# Patient Record
Sex: Male | Born: 1960
Health system: Southern US, Community
[De-identification: ages and names within clinical notes are randomized; demographics above are authoritative.]

## PROBLEM LIST (undated history)

## (undated) DIAGNOSIS — M5136 Other intervertebral disc degeneration, lumbar region: Secondary | ICD-10-CM

## (undated) DIAGNOSIS — I739 Peripheral vascular disease, unspecified: Secondary | ICD-10-CM

## (undated) DIAGNOSIS — K219 Gastro-esophageal reflux disease without esophagitis: Secondary | ICD-10-CM

## (undated) DIAGNOSIS — T8859XA Other complications of anesthesia, initial encounter: Secondary | ICD-10-CM

## (undated) DIAGNOSIS — M51369 Other intervertebral disc degeneration, lumbar region without mention of lumbar back pain or lower extremity pain: Secondary | ICD-10-CM

## (undated) DIAGNOSIS — I639 Cerebral infarction, unspecified: Secondary | ICD-10-CM

## (undated) DIAGNOSIS — N200 Calculus of kidney: Secondary | ICD-10-CM

## (undated) DIAGNOSIS — S8263XA Displaced fracture of lateral malleolus of unspecified fibula, initial encounter for closed fracture: Secondary | ICD-10-CM

## (undated) DIAGNOSIS — M1990 Unspecified osteoarthritis, unspecified site: Secondary | ICD-10-CM

## (undated) DIAGNOSIS — R011 Cardiac murmur, unspecified: Secondary | ICD-10-CM

## (undated) DIAGNOSIS — I10 Essential (primary) hypertension: Secondary | ICD-10-CM

## (undated) DIAGNOSIS — T7840XA Allergy, unspecified, initial encounter: Secondary | ICD-10-CM

## (undated) DIAGNOSIS — F329 Major depressive disorder, single episode, unspecified: Secondary | ICD-10-CM

## (undated) DIAGNOSIS — Z95828 Presence of other vascular implants and grafts: Secondary | ICD-10-CM

## (undated) DIAGNOSIS — E785 Hyperlipidemia, unspecified: Secondary | ICD-10-CM

## (undated) DIAGNOSIS — F32A Depression, unspecified: Secondary | ICD-10-CM

## (undated) DIAGNOSIS — T4145XA Adverse effect of unspecified anesthetic, initial encounter: Secondary | ICD-10-CM

## (undated) DIAGNOSIS — G4730 Sleep apnea, unspecified: Secondary | ICD-10-CM

## (undated) HISTORY — DX: Other intervertebral disc degeneration, lumbar region without mention of lumbar back pain or lower extremity pain: M51.369

## (undated) HISTORY — DX: Hyperlipidemia, unspecified: E78.5

## (undated) HISTORY — PX: TOTAL HIP ARTHROPLASTY: SHX124

## (undated) HISTORY — DX: Displaced fracture of lateral malleolus of unspecified fibula, initial encounter for closed fracture: S82.63XA

## (undated) HISTORY — PX: OTHER SURGICAL HISTORY: SHX169

## (undated) HISTORY — DX: Allergy, unspecified, initial encounter: T78.40XA

## (undated) HISTORY — DX: Other intervertebral disc degeneration, lumbar region: M51.36

## (undated) HISTORY — PX: JOINT REPLACEMENT: SHX530

---

## 1998-03-08 ENCOUNTER — Encounter: Admission: RE | Admit: 1998-03-08 | Discharge: 1998-03-08 | Payer: Self-pay | Admitting: *Deleted

## 2002-10-05 DIAGNOSIS — I639 Cerebral infarction, unspecified: Secondary | ICD-10-CM

## 2002-10-05 HISTORY — DX: Cerebral infarction, unspecified: I63.9

## 2003-03-30 ENCOUNTER — Inpatient Hospital Stay (HOSPITAL_COMMUNITY): Admission: EM | Admit: 2003-03-30 | Discharge: 2003-04-06 | Payer: Self-pay | Admitting: *Deleted

## 2003-03-30 ENCOUNTER — Encounter: Payer: Self-pay | Admitting: Emergency Medicine

## 2003-03-31 ENCOUNTER — Encounter: Payer: Self-pay | Admitting: Neurology

## 2003-04-02 ENCOUNTER — Encounter (INDEPENDENT_AMBULATORY_CARE_PROVIDER_SITE_OTHER): Payer: Self-pay | Admitting: *Deleted

## 2003-04-03 ENCOUNTER — Encounter: Payer: Self-pay | Admitting: Cardiology

## 2003-05-10 ENCOUNTER — Encounter: Admission: RE | Admit: 2003-05-10 | Discharge: 2003-05-10 | Payer: Self-pay | Admitting: Family Medicine

## 2003-05-17 ENCOUNTER — Encounter: Admission: RE | Admit: 2003-05-17 | Discharge: 2003-05-17 | Payer: Self-pay | Admitting: Family Medicine

## 2003-05-18 ENCOUNTER — Encounter: Admission: RE | Admit: 2003-05-18 | Discharge: 2003-05-18 | Payer: Self-pay | Admitting: Family Medicine

## 2003-05-21 ENCOUNTER — Encounter: Admission: RE | Admit: 2003-05-21 | Discharge: 2003-05-21 | Payer: Self-pay | Admitting: Sports Medicine

## 2003-05-28 ENCOUNTER — Encounter: Admission: RE | Admit: 2003-05-28 | Discharge: 2003-05-28 | Payer: Self-pay | Admitting: Family Medicine

## 2003-06-06 ENCOUNTER — Encounter: Admission: RE | Admit: 2003-06-06 | Discharge: 2003-06-06 | Payer: Self-pay | Admitting: Family Medicine

## 2003-06-08 ENCOUNTER — Encounter: Admission: RE | Admit: 2003-06-08 | Discharge: 2003-06-08 | Payer: Self-pay | Admitting: Sports Medicine

## 2003-06-08 ENCOUNTER — Encounter: Payer: Self-pay | Admitting: Sports Medicine

## 2003-06-12 ENCOUNTER — Encounter: Admission: RE | Admit: 2003-06-12 | Discharge: 2003-06-12 | Payer: Self-pay | Admitting: Family Medicine

## 2003-06-20 ENCOUNTER — Encounter: Admission: RE | Admit: 2003-06-20 | Discharge: 2003-06-20 | Payer: Self-pay | Admitting: Sports Medicine

## 2003-06-27 ENCOUNTER — Encounter: Admission: RE | Admit: 2003-06-27 | Discharge: 2003-06-27 | Payer: Self-pay | Admitting: Family Medicine

## 2003-07-05 ENCOUNTER — Encounter: Admission: RE | Admit: 2003-07-05 | Discharge: 2003-07-05 | Payer: Self-pay | Admitting: Sports Medicine

## 2003-07-12 ENCOUNTER — Encounter: Admission: RE | Admit: 2003-07-12 | Discharge: 2003-07-12 | Payer: Self-pay | Admitting: Family Medicine

## 2003-07-23 ENCOUNTER — Encounter: Admission: RE | Admit: 2003-07-23 | Discharge: 2003-07-23 | Payer: Self-pay | Admitting: Pediatrics

## 2003-07-31 ENCOUNTER — Encounter: Admission: RE | Admit: 2003-07-31 | Discharge: 2003-07-31 | Payer: Self-pay | Admitting: Family Medicine

## 2003-08-15 ENCOUNTER — Encounter: Admission: RE | Admit: 2003-08-15 | Discharge: 2003-08-15 | Payer: Self-pay | Admitting: Family Medicine

## 2003-08-29 ENCOUNTER — Encounter: Admission: RE | Admit: 2003-08-29 | Discharge: 2003-08-29 | Payer: Self-pay | Admitting: Family Medicine

## 2003-09-13 ENCOUNTER — Encounter: Admission: RE | Admit: 2003-09-13 | Discharge: 2003-09-13 | Payer: Self-pay | Admitting: Family Medicine

## 2003-09-27 ENCOUNTER — Encounter: Admission: RE | Admit: 2003-09-27 | Discharge: 2003-09-27 | Payer: Self-pay | Admitting: Family Medicine

## 2003-10-26 ENCOUNTER — Encounter: Admission: RE | Admit: 2003-10-26 | Discharge: 2003-10-26 | Payer: Self-pay | Admitting: Family Medicine

## 2003-11-09 ENCOUNTER — Encounter: Admission: RE | Admit: 2003-11-09 | Discharge: 2003-11-09 | Payer: Self-pay | Admitting: Family Medicine

## 2003-11-16 ENCOUNTER — Encounter: Admission: RE | Admit: 2003-11-16 | Discharge: 2003-11-16 | Payer: Self-pay | Admitting: Family Medicine

## 2003-11-23 ENCOUNTER — Encounter: Admission: RE | Admit: 2003-11-23 | Discharge: 2003-11-23 | Payer: Self-pay | Admitting: Family Medicine

## 2003-11-30 ENCOUNTER — Encounter: Admission: RE | Admit: 2003-11-30 | Discharge: 2003-11-30 | Payer: Self-pay | Admitting: Family Medicine

## 2003-12-07 ENCOUNTER — Encounter: Admission: RE | Admit: 2003-12-07 | Discharge: 2003-12-07 | Payer: Self-pay | Admitting: Family Medicine

## 2003-12-14 ENCOUNTER — Encounter: Admission: RE | Admit: 2003-12-14 | Discharge: 2003-12-14 | Payer: Self-pay | Admitting: Family Medicine

## 2003-12-21 ENCOUNTER — Encounter: Admission: RE | Admit: 2003-12-21 | Discharge: 2003-12-21 | Payer: Self-pay | Admitting: Sports Medicine

## 2004-01-02 ENCOUNTER — Encounter: Admission: RE | Admit: 2004-01-02 | Discharge: 2004-01-02 | Payer: Self-pay | Admitting: Family Medicine

## 2004-01-04 ENCOUNTER — Encounter: Admission: RE | Admit: 2004-01-04 | Discharge: 2004-01-04 | Payer: Self-pay | Admitting: Family Medicine

## 2004-01-18 ENCOUNTER — Encounter: Admission: RE | Admit: 2004-01-18 | Discharge: 2004-01-18 | Payer: Self-pay | Admitting: Sports Medicine

## 2004-02-08 ENCOUNTER — Encounter: Admission: RE | Admit: 2004-02-08 | Discharge: 2004-02-08 | Payer: Self-pay | Admitting: Family Medicine

## 2004-02-19 ENCOUNTER — Encounter: Admission: RE | Admit: 2004-02-19 | Discharge: 2004-02-19 | Payer: Self-pay | Admitting: Family Medicine

## 2004-03-04 ENCOUNTER — Encounter: Admission: RE | Admit: 2004-03-04 | Discharge: 2004-03-04 | Payer: Self-pay | Admitting: Family Medicine

## 2004-03-11 ENCOUNTER — Encounter: Admission: RE | Admit: 2004-03-11 | Discharge: 2004-03-11 | Payer: Self-pay | Admitting: Sports Medicine

## 2004-03-25 ENCOUNTER — Encounter: Admission: RE | Admit: 2004-03-25 | Discharge: 2004-03-25 | Payer: Self-pay | Admitting: Family Medicine

## 2004-04-08 ENCOUNTER — Encounter: Admission: RE | Admit: 2004-04-08 | Discharge: 2004-04-08 | Payer: Self-pay | Admitting: Sports Medicine

## 2004-04-22 ENCOUNTER — Encounter: Admission: RE | Admit: 2004-04-22 | Discharge: 2004-04-22 | Payer: Self-pay | Admitting: Family Medicine

## 2004-05-07 ENCOUNTER — Encounter: Admission: RE | Admit: 2004-05-07 | Discharge: 2004-05-07 | Payer: Self-pay | Admitting: Sports Medicine

## 2004-06-04 ENCOUNTER — Encounter: Admission: RE | Admit: 2004-06-04 | Discharge: 2004-06-04 | Payer: Self-pay | Admitting: Family Medicine

## 2004-07-04 ENCOUNTER — Ambulatory Visit: Payer: Self-pay | Admitting: Family Medicine

## 2004-07-18 ENCOUNTER — Ambulatory Visit: Payer: Self-pay | Admitting: Family Medicine

## 2004-08-12 ENCOUNTER — Ambulatory Visit: Payer: Self-pay | Admitting: Family Medicine

## 2004-08-26 ENCOUNTER — Ambulatory Visit: Payer: Self-pay | Admitting: Family Medicine

## 2004-09-18 ENCOUNTER — Ambulatory Visit: Payer: Self-pay | Admitting: Family Medicine

## 2004-10-13 ENCOUNTER — Ambulatory Visit: Payer: Self-pay | Admitting: Family Medicine

## 2004-10-29 ENCOUNTER — Ambulatory Visit: Payer: Self-pay | Admitting: Family Medicine

## 2004-11-12 ENCOUNTER — Ambulatory Visit: Payer: Self-pay | Admitting: Family Medicine

## 2004-11-26 ENCOUNTER — Ambulatory Visit: Payer: Self-pay | Admitting: Sports Medicine

## 2004-12-05 ENCOUNTER — Ambulatory Visit: Payer: Self-pay | Admitting: Family Medicine

## 2004-12-23 ENCOUNTER — Ambulatory Visit: Payer: Self-pay | Admitting: Family Medicine

## 2004-12-25 ENCOUNTER — Ambulatory Visit: Payer: Self-pay | Admitting: Sports Medicine

## 2005-01-13 ENCOUNTER — Ambulatory Visit: Payer: Self-pay | Admitting: Family Medicine

## 2005-01-28 ENCOUNTER — Ambulatory Visit: Payer: Self-pay | Admitting: Family Medicine

## 2005-02-11 ENCOUNTER — Ambulatory Visit: Payer: Self-pay | Admitting: Family Medicine

## 2005-02-25 ENCOUNTER — Ambulatory Visit: Payer: Self-pay | Admitting: Family Medicine

## 2005-03-11 ENCOUNTER — Ambulatory Visit: Payer: Self-pay | Admitting: Family Medicine

## 2005-03-25 ENCOUNTER — Ambulatory Visit: Payer: Self-pay | Admitting: Family Medicine

## 2005-04-21 ENCOUNTER — Ambulatory Visit: Payer: Self-pay | Admitting: Family Medicine

## 2005-04-23 ENCOUNTER — Ambulatory Visit: Payer: Self-pay | Admitting: Physical Medicine & Rehabilitation

## 2005-04-23 ENCOUNTER — Encounter
Admission: RE | Admit: 2005-04-23 | Discharge: 2005-07-22 | Payer: Self-pay | Admitting: Physical Medicine & Rehabilitation

## 2005-04-29 ENCOUNTER — Ambulatory Visit (HOSPITAL_COMMUNITY)
Admission: RE | Admit: 2005-04-29 | Discharge: 2005-04-29 | Payer: Self-pay | Admitting: Physical Medicine & Rehabilitation

## 2005-05-04 ENCOUNTER — Ambulatory Visit: Payer: Self-pay | Admitting: Family Medicine

## 2005-05-11 ENCOUNTER — Ambulatory Visit: Payer: Self-pay | Admitting: Family Medicine

## 2005-05-18 ENCOUNTER — Ambulatory Visit: Payer: Self-pay | Admitting: Family Medicine

## 2005-05-22 ENCOUNTER — Ambulatory Visit: Payer: Self-pay | Admitting: Family Medicine

## 2005-05-29 ENCOUNTER — Ambulatory Visit: Payer: Self-pay | Admitting: Family Medicine

## 2005-06-12 ENCOUNTER — Ambulatory Visit: Payer: Self-pay | Admitting: Family Medicine

## 2005-06-24 ENCOUNTER — Ambulatory Visit: Payer: Self-pay | Admitting: Family Medicine

## 2005-07-28 ENCOUNTER — Ambulatory Visit: Payer: Self-pay | Admitting: Family Medicine

## 2005-08-31 ENCOUNTER — Ambulatory Visit: Payer: Self-pay | Admitting: Family Medicine

## 2005-09-07 ENCOUNTER — Ambulatory Visit: Payer: Self-pay | Admitting: Family Medicine

## 2005-09-24 ENCOUNTER — Ambulatory Visit: Payer: Self-pay | Admitting: Family Medicine

## 2005-10-08 ENCOUNTER — Ambulatory Visit: Payer: Self-pay | Admitting: Family Medicine

## 2005-10-22 ENCOUNTER — Ambulatory Visit: Payer: Self-pay | Admitting: Family Medicine

## 2005-11-05 ENCOUNTER — Ambulatory Visit: Payer: Self-pay | Admitting: Family Medicine

## 2005-11-19 ENCOUNTER — Ambulatory Visit: Payer: Self-pay | Admitting: Family Medicine

## 2005-11-27 ENCOUNTER — Ambulatory Visit: Payer: Self-pay | Admitting: Family Medicine

## 2005-12-03 ENCOUNTER — Ambulatory Visit: Payer: Self-pay | Admitting: Family Medicine

## 2005-12-24 ENCOUNTER — Ambulatory Visit: Payer: Self-pay | Admitting: Family Medicine

## 2005-12-31 ENCOUNTER — Ambulatory Visit: Payer: Self-pay | Admitting: Family Medicine

## 2006-01-07 ENCOUNTER — Ambulatory Visit: Payer: Self-pay | Admitting: Family Medicine

## 2006-01-19 ENCOUNTER — Ambulatory Visit: Payer: Self-pay | Admitting: Sports Medicine

## 2006-01-26 ENCOUNTER — Ambulatory Visit: Payer: Self-pay | Admitting: Family Medicine

## 2006-02-02 ENCOUNTER — Ambulatory Visit: Payer: Self-pay | Admitting: Family Medicine

## 2006-02-09 ENCOUNTER — Ambulatory Visit: Payer: Self-pay | Admitting: Sports Medicine

## 2006-02-16 ENCOUNTER — Ambulatory Visit: Payer: Self-pay | Admitting: Sports Medicine

## 2006-03-02 ENCOUNTER — Ambulatory Visit: Payer: Self-pay | Admitting: Family Medicine

## 2006-03-11 ENCOUNTER — Ambulatory Visit: Payer: Self-pay | Admitting: Family Medicine

## 2006-03-22 ENCOUNTER — Ambulatory Visit: Payer: Self-pay | Admitting: Family Medicine

## 2006-04-01 ENCOUNTER — Ambulatory Visit: Payer: Self-pay | Admitting: Family Medicine

## 2006-04-08 ENCOUNTER — Ambulatory Visit: Payer: Self-pay | Admitting: Family Medicine

## 2006-04-22 ENCOUNTER — Ambulatory Visit: Payer: Self-pay | Admitting: Family Medicine

## 2006-05-04 ENCOUNTER — Ambulatory Visit: Payer: Self-pay | Admitting: Family Medicine

## 2006-05-18 ENCOUNTER — Ambulatory Visit: Payer: Self-pay | Admitting: Family Medicine

## 2006-06-01 ENCOUNTER — Ambulatory Visit: Payer: Self-pay | Admitting: Sports Medicine

## 2006-06-15 ENCOUNTER — Ambulatory Visit: Payer: Self-pay | Admitting: Family Medicine

## 2006-06-29 ENCOUNTER — Ambulatory Visit: Payer: Self-pay | Admitting: Family Medicine

## 2006-07-13 ENCOUNTER — Ambulatory Visit: Payer: Self-pay | Admitting: Family Medicine

## 2006-07-27 ENCOUNTER — Ambulatory Visit: Payer: Self-pay | Admitting: Family Medicine

## 2006-08-24 ENCOUNTER — Ambulatory Visit: Payer: Self-pay | Admitting: Family Medicine

## 2006-09-21 ENCOUNTER — Ambulatory Visit: Payer: Self-pay | Admitting: Family Medicine

## 2006-10-01 ENCOUNTER — Ambulatory Visit: Payer: Self-pay | Admitting: Family Medicine

## 2006-10-15 ENCOUNTER — Ambulatory Visit: Payer: Self-pay | Admitting: Family Medicine

## 2006-10-29 ENCOUNTER — Ambulatory Visit: Payer: Self-pay | Admitting: Family Medicine

## 2006-11-12 ENCOUNTER — Ambulatory Visit: Payer: Self-pay | Admitting: Family Medicine

## 2006-11-29 ENCOUNTER — Ambulatory Visit: Payer: Self-pay | Admitting: Family Medicine

## 2006-12-01 ENCOUNTER — Ambulatory Visit: Payer: Self-pay | Admitting: Family Medicine

## 2006-12-01 ENCOUNTER — Encounter (INDEPENDENT_AMBULATORY_CARE_PROVIDER_SITE_OTHER): Payer: Self-pay | Admitting: *Deleted

## 2006-12-01 LAB — CONVERTED CEMR LAB
CO2: 27 meq/L (ref 19–32)
Calcium: 10 mg/dL (ref 8.4–10.5)
Chloride: 100 meq/L (ref 96–112)
HDL: 40 mg/dL (ref >39)
LDL Cholesterol: ELEVATED mg/dL
Sodium: 138 meq/L (ref 135–145)
TSH: 1.103 microintl units/mL (ref 0.350–5.50)
Testosterone: 272.26 ng/dL — ABNORMAL LOW (ref 350–890)

## 2006-12-02 ENCOUNTER — Encounter (INDEPENDENT_AMBULATORY_CARE_PROVIDER_SITE_OTHER): Payer: Self-pay | Admitting: *Deleted

## 2006-12-02 LAB — CONVERTED CEMR LAB
Alkaline Phosphatase: 55 units/L (ref 39–117)
Bilirubin, Direct: 0.1 mg/dL (ref 0.0–0.3)

## 2006-12-10 ENCOUNTER — Telehealth (INDEPENDENT_AMBULATORY_CARE_PROVIDER_SITE_OTHER): Payer: Self-pay | Admitting: *Deleted

## 2006-12-22 ENCOUNTER — Ambulatory Visit: Payer: Self-pay | Admitting: Sports Medicine

## 2006-12-22 LAB — CONVERTED CEMR LAB
INR: 1.2
Prothrombin Time: 14.1 s

## 2006-12-24 ENCOUNTER — Telehealth (INDEPENDENT_AMBULATORY_CARE_PROVIDER_SITE_OTHER): Payer: Self-pay | Admitting: *Deleted

## 2006-12-28 ENCOUNTER — Ambulatory Visit: Payer: Self-pay | Admitting: Family Medicine

## 2006-12-28 LAB — CONVERTED CEMR LAB

## 2007-01-03 ENCOUNTER — Ambulatory Visit: Payer: Self-pay | Admitting: Sports Medicine

## 2007-01-03 LAB — CONVERTED CEMR LAB: INR: 1.8

## 2007-01-10 ENCOUNTER — Ambulatory Visit: Payer: Self-pay | Admitting: Family Medicine

## 2007-01-20 ENCOUNTER — Ambulatory Visit: Payer: Self-pay | Admitting: Family Medicine

## 2007-01-20 ENCOUNTER — Encounter (INDEPENDENT_AMBULATORY_CARE_PROVIDER_SITE_OTHER): Payer: Self-pay | Admitting: *Deleted

## 2007-01-20 DIAGNOSIS — IMO0002 Reserved for concepts with insufficient information to code with codable children: Secondary | ICD-10-CM | POA: Insufficient documentation

## 2007-01-20 DIAGNOSIS — I10 Essential (primary) hypertension: Secondary | ICD-10-CM | POA: Insufficient documentation

## 2007-01-20 DIAGNOSIS — F17200 Nicotine dependence, unspecified, uncomplicated: Secondary | ICD-10-CM | POA: Insufficient documentation

## 2007-01-20 DIAGNOSIS — M87059 Idiopathic aseptic necrosis of unspecified femur: Secondary | ICD-10-CM | POA: Insufficient documentation

## 2007-01-20 DIAGNOSIS — M545 Low back pain, unspecified: Secondary | ICD-10-CM | POA: Insufficient documentation

## 2007-01-20 LAB — CONVERTED CEMR LAB: INR: 4.3

## 2007-01-27 ENCOUNTER — Encounter (INDEPENDENT_AMBULATORY_CARE_PROVIDER_SITE_OTHER): Payer: Self-pay | Admitting: *Deleted

## 2007-02-01 ENCOUNTER — Ambulatory Visit: Payer: Self-pay | Admitting: Family Medicine

## 2007-02-01 LAB — CONVERTED CEMR LAB: INR: 3.9

## 2007-02-14 ENCOUNTER — Encounter: Payer: Self-pay | Admitting: *Deleted

## 2007-02-14 ENCOUNTER — Ambulatory Visit: Payer: Self-pay | Admitting: Sports Medicine

## 2007-02-14 LAB — CONVERTED CEMR LAB: INR: 2.9

## 2007-02-18 ENCOUNTER — Telehealth (INDEPENDENT_AMBULATORY_CARE_PROVIDER_SITE_OTHER): Payer: Self-pay | Admitting: *Deleted

## 2007-02-23 ENCOUNTER — Encounter (INDEPENDENT_AMBULATORY_CARE_PROVIDER_SITE_OTHER): Payer: Self-pay | Admitting: *Deleted

## 2007-03-01 ENCOUNTER — Ambulatory Visit: Payer: Self-pay | Admitting: Sports Medicine

## 2007-03-01 LAB — CONVERTED CEMR LAB: INR: 2.8

## 2007-03-22 ENCOUNTER — Ambulatory Visit: Payer: Self-pay | Admitting: Sports Medicine

## 2007-03-22 LAB — CONVERTED CEMR LAB: INR: 2.9

## 2007-03-28 ENCOUNTER — Telehealth (INDEPENDENT_AMBULATORY_CARE_PROVIDER_SITE_OTHER): Payer: Self-pay | Admitting: *Deleted

## 2007-04-19 ENCOUNTER — Ambulatory Visit: Payer: Self-pay | Admitting: Family Medicine

## 2007-04-19 LAB — CONVERTED CEMR LAB: INR: 1.7

## 2007-04-28 ENCOUNTER — Ambulatory Visit: Payer: Self-pay | Admitting: Family Medicine

## 2007-04-28 LAB — CONVERTED CEMR LAB: INR: 2.7

## 2007-05-20 ENCOUNTER — Ambulatory Visit: Payer: Self-pay | Admitting: Family Medicine

## 2007-05-20 LAB — CONVERTED CEMR LAB: INR: 2.8

## 2007-06-09 ENCOUNTER — Ambulatory Visit: Payer: Self-pay | Admitting: Sports Medicine

## 2007-07-07 ENCOUNTER — Encounter (INDEPENDENT_AMBULATORY_CARE_PROVIDER_SITE_OTHER): Payer: Self-pay | Admitting: *Deleted

## 2007-07-07 ENCOUNTER — Ambulatory Visit: Payer: Self-pay | Admitting: Family Medicine

## 2007-07-07 ENCOUNTER — Encounter: Payer: Self-pay | Admitting: *Deleted

## 2007-07-07 LAB — CONVERTED CEMR LAB
Bilirubin Urine: NEGATIVE
Glucose, Urine, Semiquant: NEGATIVE
Nitrite: POSITIVE
Urobilinogen, UA: 1

## 2007-07-14 ENCOUNTER — Ambulatory Visit: Payer: Self-pay | Admitting: Family Medicine

## 2007-07-28 ENCOUNTER — Ambulatory Visit: Payer: Self-pay | Admitting: Family Medicine

## 2007-07-28 LAB — CONVERTED CEMR LAB: INR: 3.1

## 2007-08-05 ENCOUNTER — Ambulatory Visit: Payer: Self-pay | Admitting: Family Medicine

## 2007-08-10 ENCOUNTER — Encounter (INDEPENDENT_AMBULATORY_CARE_PROVIDER_SITE_OTHER): Payer: Self-pay | Admitting: *Deleted

## 2007-08-12 ENCOUNTER — Ambulatory Visit: Payer: Self-pay | Admitting: Family Medicine

## 2007-08-12 LAB — CONVERTED CEMR LAB: INR: 3.1

## 2007-08-18 ENCOUNTER — Encounter (INDEPENDENT_AMBULATORY_CARE_PROVIDER_SITE_OTHER): Payer: Self-pay | Admitting: *Deleted

## 2007-08-19 ENCOUNTER — Telehealth (INDEPENDENT_AMBULATORY_CARE_PROVIDER_SITE_OTHER): Payer: Self-pay | Admitting: *Deleted

## 2007-08-19 ENCOUNTER — Ambulatory Visit: Payer: Self-pay | Admitting: Family Medicine

## 2007-08-24 ENCOUNTER — Encounter: Admission: RE | Admit: 2007-08-24 | Discharge: 2007-08-24 | Payer: Self-pay | Admitting: Cardiology

## 2007-08-26 ENCOUNTER — Ambulatory Visit: Payer: Self-pay | Admitting: Family Medicine

## 2007-08-26 LAB — CONVERTED CEMR LAB: INR: 2.5

## 2007-09-05 ENCOUNTER — Ambulatory Visit: Payer: Self-pay | Admitting: Family Medicine

## 2007-09-12 ENCOUNTER — Encounter (INDEPENDENT_AMBULATORY_CARE_PROVIDER_SITE_OTHER): Payer: Self-pay | Admitting: *Deleted

## 2007-09-12 ENCOUNTER — Ambulatory Visit: Payer: Self-pay | Admitting: Family Medicine

## 2007-09-12 DIAGNOSIS — E349 Endocrine disorder, unspecified: Secondary | ICD-10-CM | POA: Insufficient documentation

## 2007-09-12 LAB — CONVERTED CEMR LAB: INR: 2.4

## 2007-09-14 LAB — CONVERTED CEMR LAB: Testosterone: 232.27 ng/dL — ABNORMAL LOW (ref 350–890)

## 2007-09-15 ENCOUNTER — Telehealth (INDEPENDENT_AMBULATORY_CARE_PROVIDER_SITE_OTHER): Payer: Self-pay | Admitting: *Deleted

## 2007-09-20 ENCOUNTER — Telehealth (INDEPENDENT_AMBULATORY_CARE_PROVIDER_SITE_OTHER): Payer: Self-pay | Admitting: *Deleted

## 2007-09-20 ENCOUNTER — Ambulatory Visit: Payer: Self-pay | Admitting: Family Medicine

## 2007-09-26 ENCOUNTER — Ambulatory Visit: Payer: Self-pay | Admitting: Sports Medicine

## 2007-09-27 ENCOUNTER — Ambulatory Visit (HOSPITAL_COMMUNITY): Admission: RE | Admit: 2007-09-27 | Discharge: 2007-09-27 | Payer: Self-pay | Admitting: Cardiology

## 2007-09-27 ENCOUNTER — Encounter (INDEPENDENT_AMBULATORY_CARE_PROVIDER_SITE_OTHER): Payer: Self-pay | Admitting: Cardiology

## 2007-10-03 ENCOUNTER — Ambulatory Visit: Payer: Self-pay | Admitting: Sports Medicine

## 2007-10-04 ENCOUNTER — Ambulatory Visit: Payer: Self-pay | Admitting: Family Medicine

## 2007-10-11 ENCOUNTER — Ambulatory Visit: Payer: Self-pay | Admitting: Family Medicine

## 2007-10-12 ENCOUNTER — Telehealth (INDEPENDENT_AMBULATORY_CARE_PROVIDER_SITE_OTHER): Payer: Self-pay | Admitting: *Deleted

## 2007-10-19 ENCOUNTER — Ambulatory Visit: Payer: Self-pay | Admitting: Family Medicine

## 2007-10-22 ENCOUNTER — Emergency Department (HOSPITAL_COMMUNITY): Admission: EM | Admit: 2007-10-22 | Discharge: 2007-10-22 | Payer: Self-pay | Admitting: Family Medicine

## 2007-10-22 ENCOUNTER — Ambulatory Visit (HOSPITAL_COMMUNITY): Admission: RE | Admit: 2007-10-22 | Discharge: 2007-10-22 | Payer: Self-pay | Admitting: Family Medicine

## 2007-10-25 ENCOUNTER — Telehealth: Payer: Self-pay | Admitting: Family Medicine

## 2007-10-26 ENCOUNTER — Ambulatory Visit: Payer: Self-pay | Admitting: Family Medicine

## 2007-10-31 ENCOUNTER — Ambulatory Visit: Payer: Self-pay | Admitting: Family Medicine

## 2007-11-01 ENCOUNTER — Ambulatory Visit: Payer: Self-pay | Admitting: Family Medicine

## 2007-11-09 ENCOUNTER — Ambulatory Visit: Payer: Self-pay | Admitting: Family Medicine

## 2007-11-15 ENCOUNTER — Ambulatory Visit: Payer: Self-pay | Admitting: Family Medicine

## 2007-11-18 ENCOUNTER — Encounter (INDEPENDENT_AMBULATORY_CARE_PROVIDER_SITE_OTHER): Payer: Self-pay | Admitting: *Deleted

## 2007-11-18 ENCOUNTER — Ambulatory Visit: Payer: Self-pay | Admitting: Family Medicine

## 2007-11-18 DIAGNOSIS — F329 Major depressive disorder, single episode, unspecified: Secondary | ICD-10-CM

## 2007-11-18 DIAGNOSIS — Q211 Atrial septal defect: Secondary | ICD-10-CM | POA: Insufficient documentation

## 2007-11-18 DIAGNOSIS — F32A Depression, unspecified: Secondary | ICD-10-CM | POA: Insufficient documentation

## 2007-11-19 ENCOUNTER — Encounter (INDEPENDENT_AMBULATORY_CARE_PROVIDER_SITE_OTHER): Payer: Self-pay | Admitting: *Deleted

## 2007-11-23 ENCOUNTER — Ambulatory Visit: Payer: Self-pay | Admitting: Family Medicine

## 2007-11-29 ENCOUNTER — Ambulatory Visit: Payer: Self-pay | Admitting: Family Medicine

## 2007-11-29 LAB — CONVERTED CEMR LAB: INR: 1.9

## 2007-12-02 ENCOUNTER — Encounter (INDEPENDENT_AMBULATORY_CARE_PROVIDER_SITE_OTHER): Payer: Self-pay | Admitting: *Deleted

## 2007-12-06 ENCOUNTER — Ambulatory Visit: Payer: Self-pay | Admitting: Family Medicine

## 2007-12-13 ENCOUNTER — Encounter (INDEPENDENT_AMBULATORY_CARE_PROVIDER_SITE_OTHER): Payer: Self-pay | Admitting: *Deleted

## 2007-12-13 ENCOUNTER — Ambulatory Visit: Payer: Self-pay | Admitting: Family Medicine

## 2007-12-20 ENCOUNTER — Ambulatory Visit: Payer: Self-pay | Admitting: Family Medicine

## 2007-12-27 ENCOUNTER — Ambulatory Visit: Payer: Self-pay | Admitting: Family Medicine

## 2008-01-02 ENCOUNTER — Encounter (INDEPENDENT_AMBULATORY_CARE_PROVIDER_SITE_OTHER): Payer: Self-pay | Admitting: *Deleted

## 2008-01-02 ENCOUNTER — Encounter: Admission: RE | Admit: 2008-01-02 | Discharge: 2008-01-02 | Payer: Self-pay | Admitting: *Deleted

## 2008-01-02 ENCOUNTER — Telehealth (INDEPENDENT_AMBULATORY_CARE_PROVIDER_SITE_OTHER): Payer: Self-pay | Admitting: *Deleted

## 2008-01-02 ENCOUNTER — Ambulatory Visit: Payer: Self-pay | Admitting: Family Medicine

## 2008-01-04 ENCOUNTER — Encounter (INDEPENDENT_AMBULATORY_CARE_PROVIDER_SITE_OTHER): Payer: Self-pay | Admitting: *Deleted

## 2008-01-04 ENCOUNTER — Ambulatory Visit: Payer: Self-pay | Admitting: Family Medicine

## 2008-01-04 DIAGNOSIS — S8263XA Displaced fracture of lateral malleolus of unspecified fibula, initial encounter for closed fracture: Secondary | ICD-10-CM | POA: Insufficient documentation

## 2008-01-04 HISTORY — DX: Displaced fracture of lateral malleolus of unspecified fibula, initial encounter for closed fracture: S82.63XA

## 2008-01-05 LAB — CONVERTED CEMR LAB
AST: 26 units/L (ref 0–37)
Albumin: 4.3 g/dL (ref 3.5–5.2)
BUN: 17 mg/dL (ref 6–23)
Calcium: 9.8 mg/dL (ref 8.4–10.5)
Chloride: 98 meq/L (ref 96–112)
Potassium: 3.7 meq/L (ref 3.5–5.3)
Sodium: 141 meq/L (ref 135–145)
Total Protein: 7.3 g/dL (ref 6.0–8.3)

## 2008-01-11 ENCOUNTER — Ambulatory Visit: Payer: Self-pay | Admitting: Family Medicine

## 2008-01-20 ENCOUNTER — Encounter: Payer: Self-pay | Admitting: Family Medicine

## 2008-02-03 ENCOUNTER — Ambulatory Visit: Payer: Self-pay | Admitting: Family Medicine

## 2008-02-10 ENCOUNTER — Ambulatory Visit: Payer: Self-pay | Admitting: Family Medicine

## 2008-02-13 ENCOUNTER — Encounter (INDEPENDENT_AMBULATORY_CARE_PROVIDER_SITE_OTHER): Payer: Self-pay | Admitting: *Deleted

## 2008-02-28 ENCOUNTER — Ambulatory Visit: Payer: Self-pay | Admitting: Family Medicine

## 2008-03-07 ENCOUNTER — Encounter (INDEPENDENT_AMBULATORY_CARE_PROVIDER_SITE_OTHER): Payer: Self-pay | Admitting: *Deleted

## 2008-03-13 ENCOUNTER — Ambulatory Visit: Payer: Self-pay | Admitting: Family Medicine

## 2008-03-21 ENCOUNTER — Encounter (INDEPENDENT_AMBULATORY_CARE_PROVIDER_SITE_OTHER): Payer: Self-pay | Admitting: *Deleted

## 2008-03-27 ENCOUNTER — Ambulatory Visit: Payer: Self-pay | Admitting: Family Medicine

## 2008-04-05 IMAGING — CR DG ANKLE COMPLETE 3+V*L*
3 series · 3 of 3 positions shown · non-contrast
Comparison: None

CLINICAL DATA: Left ankle pain, no known injury

LEFT ANKLE COMPLETE - 3+ VIEW

[t ankle joint ap left]
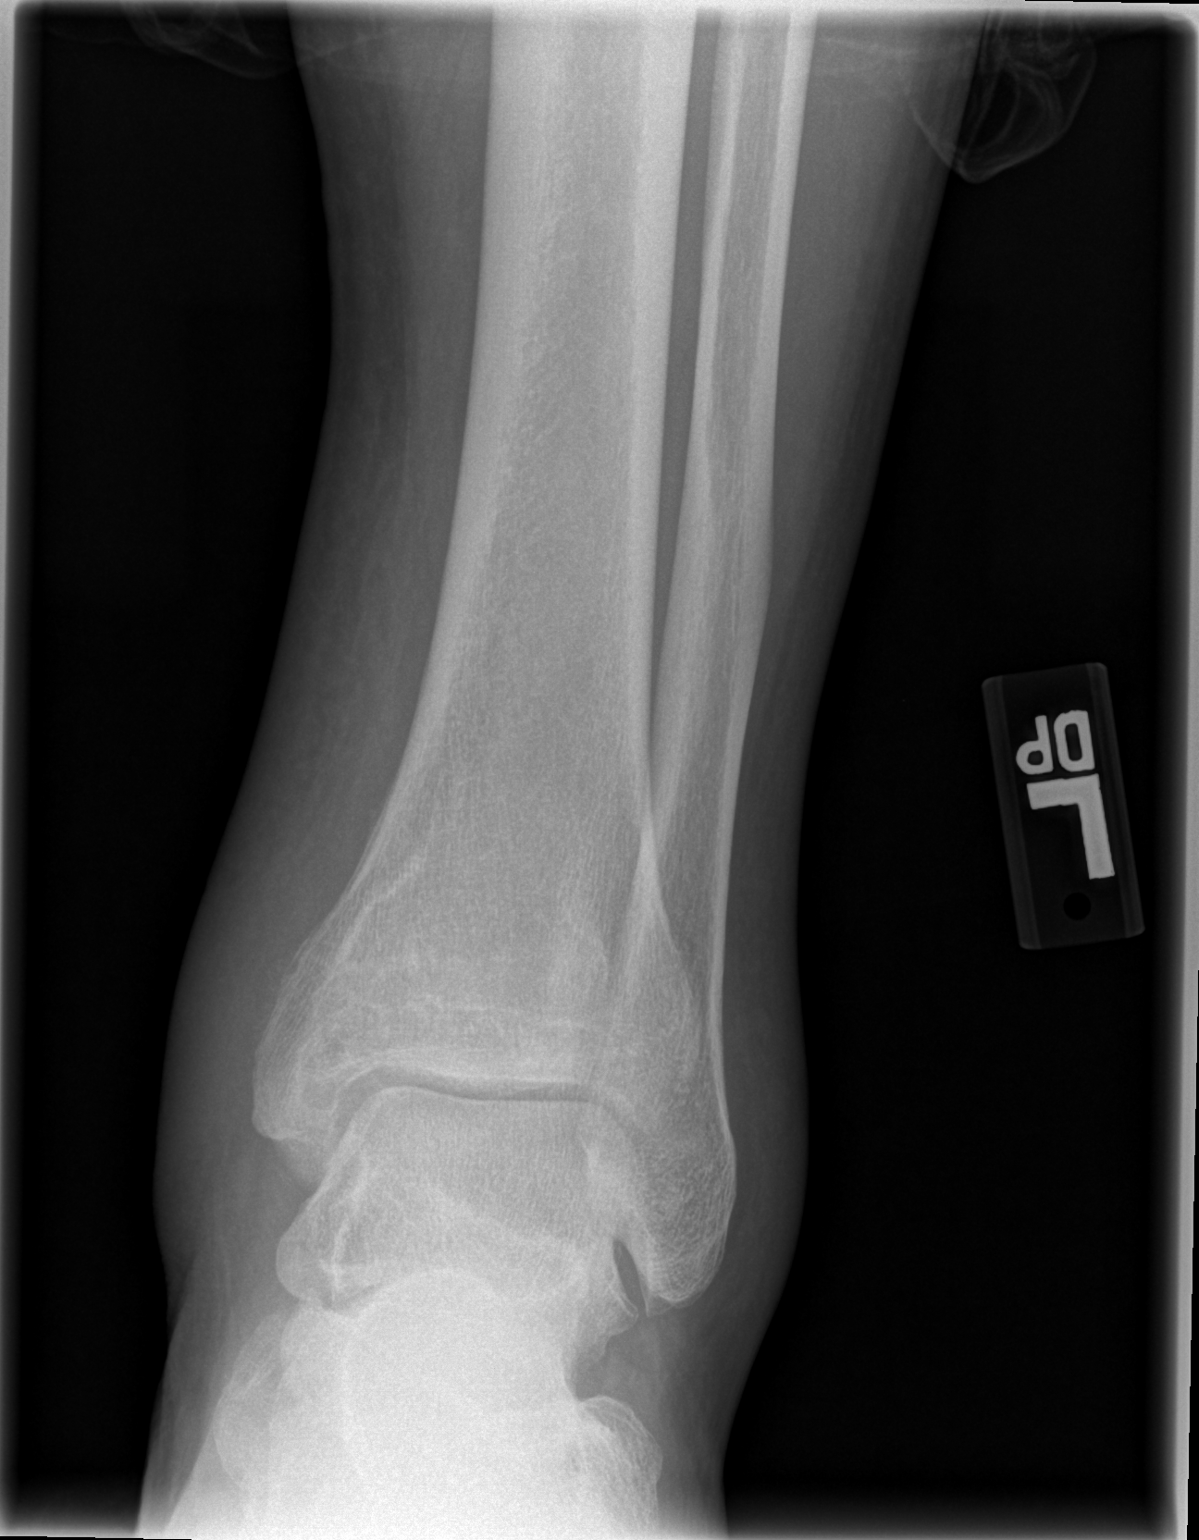

[t ankle joint oblique left]
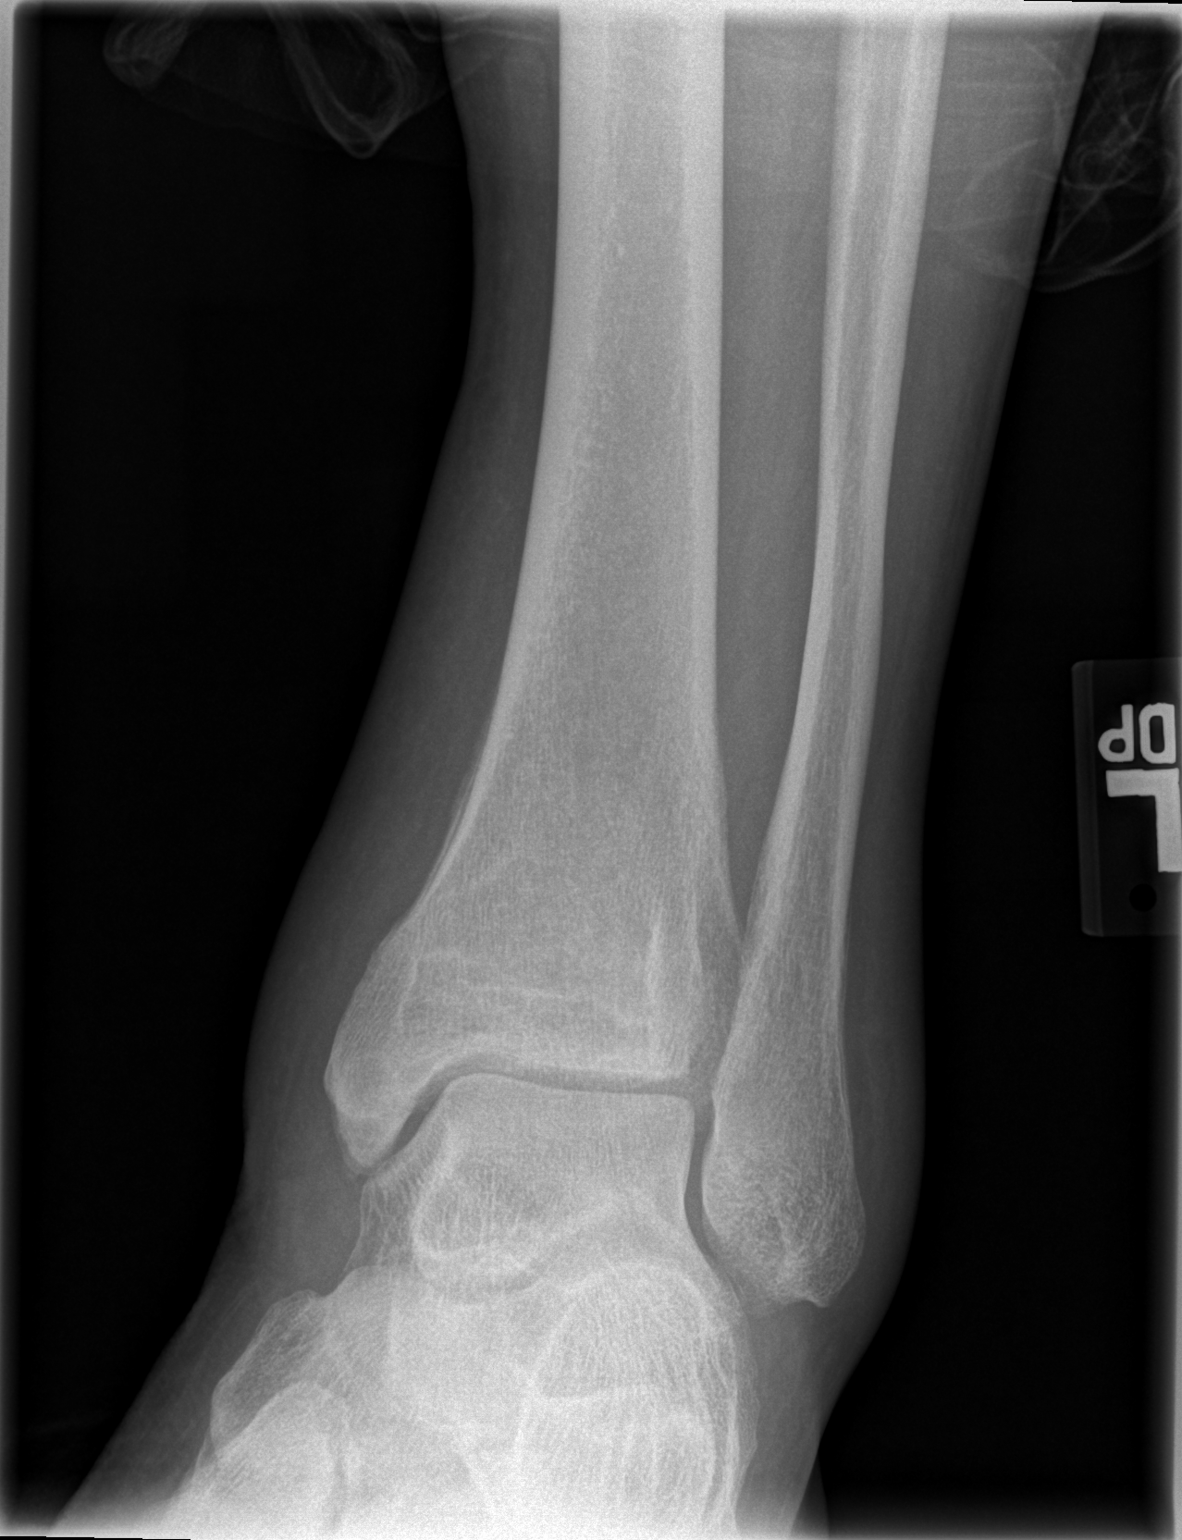

[t ankle joint lat left]
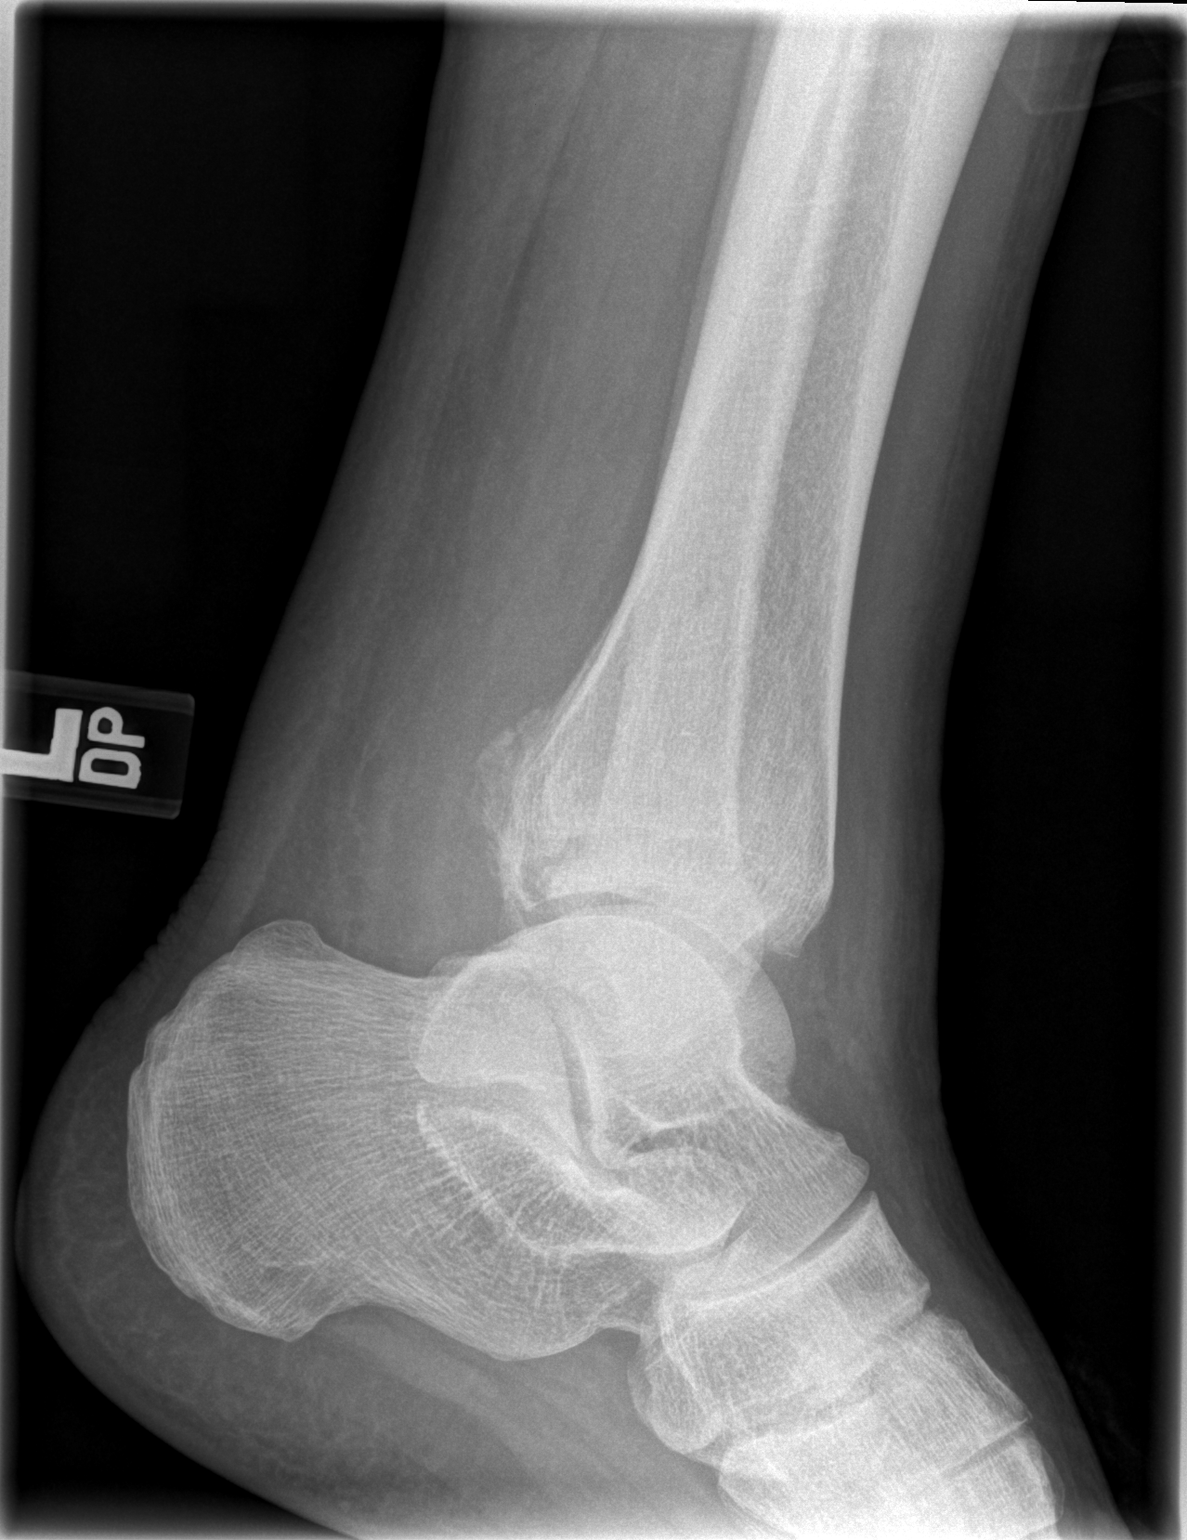

[3 of 3 positions shown; findings below may reference images not displayed]

FINDINGS: There is diffuse soft tissue swelling.  Irregular lucency
and cortical irregularity noted in the posterior tibia.  This is
most compatible with a posterior malleolar fracture.  This is of
unknown age.  No additional bony abnormality.
IMPRESSION: Findings compatible with a posterior malleolar fracture of unknown
age.  This may be related to old injury or possibly subacute in
age.

## 2008-04-10 ENCOUNTER — Ambulatory Visit: Payer: Self-pay | Admitting: Family Medicine

## 2008-04-18 ENCOUNTER — Telehealth: Payer: Self-pay | Admitting: Family Medicine

## 2008-04-25 ENCOUNTER — Ambulatory Visit: Payer: Self-pay | Admitting: Family Medicine

## 2008-05-10 ENCOUNTER — Ambulatory Visit: Payer: Self-pay | Admitting: Family Medicine

## 2008-05-22 ENCOUNTER — Encounter: Payer: Self-pay | Admitting: Family Medicine

## 2008-05-22 ENCOUNTER — Ambulatory Visit: Payer: Self-pay | Admitting: Family Medicine

## 2008-05-22 DIAGNOSIS — E781 Pure hyperglyceridemia: Secondary | ICD-10-CM | POA: Insufficient documentation

## 2008-05-24 ENCOUNTER — Telehealth: Payer: Self-pay | Admitting: Family Medicine

## 2008-06-06 ENCOUNTER — Ambulatory Visit: Payer: Self-pay | Admitting: Family Medicine

## 2008-06-18 ENCOUNTER — Telehealth (INDEPENDENT_AMBULATORY_CARE_PROVIDER_SITE_OTHER): Payer: Self-pay | Admitting: *Deleted

## 2008-06-19 ENCOUNTER — Ambulatory Visit: Payer: Self-pay | Admitting: Family Medicine

## 2008-06-19 ENCOUNTER — Encounter (INDEPENDENT_AMBULATORY_CARE_PROVIDER_SITE_OTHER): Payer: Self-pay | Admitting: *Deleted

## 2008-07-03 ENCOUNTER — Ambulatory Visit: Payer: Self-pay | Admitting: Family Medicine

## 2008-07-06 ENCOUNTER — Ambulatory Visit: Payer: Self-pay | Admitting: Family Medicine

## 2008-07-06 ENCOUNTER — Encounter: Payer: Self-pay | Admitting: Family Medicine

## 2008-07-06 LAB — CONVERTED CEMR LAB
HCT: 50.4 % (ref 39.0–52.0)
Hemoglobin: 16.5 g/dL (ref 13.0–17.0)
LDL Cholesterol: 74 mg/dL (ref 0–99)
MCHC: 32.7 g/dL (ref 30.0–36.0)
MCV: 107.9 fL — ABNORMAL HIGH (ref 78.0–100.0)
PSA: 1.07 ng/mL (ref 0.10–4.00)
RDW: 12.3 % (ref 11.5–15.5)
Triglycerides: 82 mg/dL (ref <150)
VLDL: 16 mg/dL (ref 0–40)

## 2008-07-16 ENCOUNTER — Telehealth: Payer: Self-pay | Admitting: Family Medicine

## 2008-07-17 ENCOUNTER — Encounter (INDEPENDENT_AMBULATORY_CARE_PROVIDER_SITE_OTHER): Payer: Self-pay | Admitting: *Deleted

## 2008-07-17 ENCOUNTER — Ambulatory Visit: Payer: Self-pay | Admitting: Family Medicine

## 2008-07-31 ENCOUNTER — Ambulatory Visit: Payer: Self-pay | Admitting: Family Medicine

## 2008-08-15 ENCOUNTER — Telehealth (INDEPENDENT_AMBULATORY_CARE_PROVIDER_SITE_OTHER): Payer: Self-pay | Admitting: *Deleted

## 2008-08-15 ENCOUNTER — Ambulatory Visit: Payer: Self-pay | Admitting: Family Medicine

## 2008-08-28 ENCOUNTER — Ambulatory Visit: Payer: Self-pay | Admitting: Family Medicine

## 2008-08-29 ENCOUNTER — Ambulatory Visit: Payer: Self-pay | Admitting: Family Medicine

## 2008-08-29 DIAGNOSIS — F528 Other sexual dysfunction not due to a substance or known physiological condition: Secondary | ICD-10-CM | POA: Insufficient documentation

## 2008-08-31 ENCOUNTER — Encounter: Admission: RE | Admit: 2008-08-31 | Discharge: 2008-08-31 | Payer: Self-pay | Admitting: Family Medicine

## 2008-09-12 ENCOUNTER — Ambulatory Visit: Payer: Self-pay | Admitting: Family Medicine

## 2008-09-14 ENCOUNTER — Telehealth: Payer: Self-pay | Admitting: Family Medicine

## 2008-09-25 ENCOUNTER — Ambulatory Visit: Payer: Self-pay | Admitting: Family Medicine

## 2008-10-10 ENCOUNTER — Telehealth: Payer: Self-pay | Admitting: Family Medicine

## 2008-10-10 ENCOUNTER — Ambulatory Visit: Payer: Self-pay | Admitting: Family Medicine

## 2008-10-15 ENCOUNTER — Telehealth (INDEPENDENT_AMBULATORY_CARE_PROVIDER_SITE_OTHER): Payer: Self-pay | Admitting: *Deleted

## 2008-10-18 ENCOUNTER — Encounter: Payer: Self-pay | Admitting: Family Medicine

## 2008-10-18 ENCOUNTER — Ambulatory Visit: Payer: Self-pay | Admitting: Family Medicine

## 2008-10-18 LAB — CONVERTED CEMR LAB
Chloride: 98 meq/L (ref 96–112)
Potassium: 3.3 meq/L — ABNORMAL LOW (ref 3.5–5.3)
Sodium: 141 meq/L (ref 135–145)
Vit D, 1,25-Dihydroxy: 53 (ref 30–89)

## 2008-10-21 ENCOUNTER — Encounter: Payer: Self-pay | Admitting: Family Medicine

## 2008-10-24 ENCOUNTER — Ambulatory Visit: Payer: Self-pay | Admitting: Family Medicine

## 2008-11-06 ENCOUNTER — Ambulatory Visit: Payer: Self-pay | Admitting: Family Medicine

## 2008-11-09 ENCOUNTER — Encounter: Payer: Self-pay | Admitting: Family Medicine

## 2008-11-15 ENCOUNTER — Telehealth (INDEPENDENT_AMBULATORY_CARE_PROVIDER_SITE_OTHER): Payer: Self-pay | Admitting: *Deleted

## 2008-11-21 ENCOUNTER — Ambulatory Visit: Payer: Self-pay | Admitting: Family Medicine

## 2008-12-03 ENCOUNTER — Ambulatory Visit: Payer: Self-pay | Admitting: Family Medicine

## 2008-12-11 ENCOUNTER — Telehealth: Payer: Self-pay | Admitting: Family Medicine

## 2008-12-20 ENCOUNTER — Ambulatory Visit: Payer: Self-pay | Admitting: Family Medicine

## 2009-01-03 ENCOUNTER — Ambulatory Visit: Payer: Self-pay | Admitting: Family Medicine

## 2009-01-08 ENCOUNTER — Telehealth (INDEPENDENT_AMBULATORY_CARE_PROVIDER_SITE_OTHER): Payer: Self-pay | Admitting: *Deleted

## 2009-01-18 ENCOUNTER — Ambulatory Visit: Payer: Self-pay | Admitting: Family Medicine

## 2009-01-30 ENCOUNTER — Ambulatory Visit: Payer: Self-pay | Admitting: Family Medicine

## 2009-02-11 ENCOUNTER — Telehealth: Payer: Self-pay | Admitting: Family Medicine

## 2009-02-19 ENCOUNTER — Ambulatory Visit: Payer: Self-pay | Admitting: Family Medicine

## 2009-03-06 ENCOUNTER — Ambulatory Visit: Payer: Self-pay | Admitting: Family Medicine

## 2009-03-14 ENCOUNTER — Ambulatory Visit: Payer: Self-pay | Admitting: Family Medicine

## 2009-03-19 ENCOUNTER — Ambulatory Visit: Payer: Self-pay | Admitting: Family Medicine

## 2009-04-02 ENCOUNTER — Ambulatory Visit: Payer: Self-pay | Admitting: Family Medicine

## 2009-04-09 ENCOUNTER — Telehealth: Payer: Self-pay | Admitting: Family Medicine

## 2009-04-18 ENCOUNTER — Ambulatory Visit: Payer: Self-pay | Admitting: Family Medicine

## 2009-04-29 ENCOUNTER — Ambulatory Visit: Payer: Self-pay | Admitting: Family Medicine

## 2009-05-08 ENCOUNTER — Telehealth (INDEPENDENT_AMBULATORY_CARE_PROVIDER_SITE_OTHER): Payer: Self-pay | Admitting: *Deleted

## 2009-05-15 ENCOUNTER — Ambulatory Visit: Payer: Self-pay | Admitting: Family Medicine

## 2009-05-31 ENCOUNTER — Ambulatory Visit: Payer: Self-pay | Admitting: Family Medicine

## 2009-06-07 ENCOUNTER — Telehealth: Payer: Self-pay | Admitting: *Deleted

## 2009-06-20 ENCOUNTER — Ambulatory Visit: Payer: Self-pay | Admitting: Family Medicine

## 2009-06-20 ENCOUNTER — Telehealth: Payer: Self-pay | Admitting: *Deleted

## 2009-06-20 DIAGNOSIS — G4700 Insomnia, unspecified: Secondary | ICD-10-CM | POA: Insufficient documentation

## 2009-07-04 ENCOUNTER — Ambulatory Visit: Payer: Self-pay | Admitting: Family Medicine

## 2009-07-19 ENCOUNTER — Encounter: Payer: Self-pay | Admitting: Family Medicine

## 2009-07-19 ENCOUNTER — Ambulatory Visit: Payer: Self-pay | Admitting: Family Medicine

## 2009-07-19 DIAGNOSIS — N400 Enlarged prostate without lower urinary tract symptoms: Secondary | ICD-10-CM | POA: Insufficient documentation

## 2009-07-21 LAB — CONVERTED CEMR LAB
ALT: 11 units/L (ref 0–53)
Albumin: 4.3 g/dL (ref 3.5–5.2)
CO2: 24 meq/L (ref 19–32)
Chloride: 102 meq/L (ref 96–112)
Cholesterol: 152 mg/dL (ref 0–200)
Glucose, Bld: 94 mg/dL (ref 70–99)
LDL Cholesterol: 81 mg/dL (ref 0–99)
Potassium: 4.6 meq/L (ref 3.5–5.3)
Sodium: 138 meq/L (ref 135–145)
Total Protein: 7.7 g/dL (ref 6.0–8.3)
Triglycerides: 148 mg/dL (ref <150)
VLDL: 30 mg/dL (ref 0–40)

## 2009-07-22 ENCOUNTER — Telehealth (INDEPENDENT_AMBULATORY_CARE_PROVIDER_SITE_OTHER): Payer: Self-pay | Admitting: *Deleted

## 2009-07-22 ENCOUNTER — Ambulatory Visit: Payer: Self-pay | Admitting: Family Medicine

## 2009-07-22 ENCOUNTER — Encounter: Payer: Self-pay | Admitting: Family Medicine

## 2009-07-22 ENCOUNTER — Inpatient Hospital Stay (HOSPITAL_COMMUNITY): Admission: AD | Admit: 2009-07-22 | Discharge: 2009-07-25 | Payer: Self-pay | Admitting: Family Medicine

## 2009-07-22 DIAGNOSIS — N182 Chronic kidney disease, stage 2 (mild): Secondary | ICD-10-CM | POA: Insufficient documentation

## 2009-07-30 ENCOUNTER — Encounter: Payer: Self-pay | Admitting: Family Medicine

## 2009-07-30 ENCOUNTER — Encounter: Payer: Self-pay | Admitting: *Deleted

## 2009-08-01 ENCOUNTER — Ambulatory Visit: Payer: Self-pay | Admitting: Family Medicine

## 2009-08-01 ENCOUNTER — Encounter: Payer: Self-pay | Admitting: Family Medicine

## 2009-08-12 LAB — CONVERTED CEMR LAB
AST: 16 units/L (ref 0–37)
Albumin: 4.5 g/dL (ref 3.5–5.2)
Alkaline Phosphatase: 62 units/L (ref 39–117)
BUN: 21 mg/dL (ref 6–23)
Calcium: 10 mg/dL (ref 8.4–10.5)
Chloride: 107 meq/L (ref 96–112)
Glucose, Bld: 86 mg/dL (ref 70–99)
Potassium: 3.9 meq/L (ref 3.5–5.3)
Sodium: 142 meq/L (ref 135–145)
Total Protein: 7.4 g/dL (ref 6.0–8.3)

## 2009-08-23 ENCOUNTER — Encounter: Payer: Self-pay | Admitting: Family Medicine

## 2009-09-02 ENCOUNTER — Telehealth: Payer: Self-pay | Admitting: *Deleted

## 2009-10-01 ENCOUNTER — Telehealth: Payer: Self-pay | Admitting: Family Medicine

## 2009-11-05 ENCOUNTER — Ambulatory Visit: Payer: Self-pay | Admitting: Family Medicine

## 2009-11-05 ENCOUNTER — Encounter: Payer: Self-pay | Admitting: Family Medicine

## 2009-11-05 LAB — CONVERTED CEMR LAB
BUN: 18 mg/dL (ref 6–23)
Calcium: 10.3 mg/dL (ref 8.4–10.5)
Glucose, Bld: 100 mg/dL — ABNORMAL HIGH (ref 70–99)
Potassium: 3.8 meq/L (ref 3.5–5.3)
Testosterone: 124.1 ng/dL — ABNORMAL LOW (ref 350–890)

## 2009-11-27 ENCOUNTER — Encounter: Payer: Self-pay | Admitting: Family Medicine

## 2009-11-27 ENCOUNTER — Ambulatory Visit: Payer: Self-pay | Admitting: Family Medicine

## 2009-11-29 ENCOUNTER — Encounter: Payer: Self-pay | Admitting: Family Medicine

## 2009-11-29 ENCOUNTER — Telehealth: Payer: Self-pay | Admitting: Family Medicine

## 2009-11-29 LAB — CONVERTED CEMR LAB
CO2: 23 meq/L (ref 19–32)
Calcium: 10 mg/dL (ref 8.4–10.5)
Creatinine, Ser: 1.3 mg/dL (ref 0.40–1.50)
Homocysteine: 11.9 micromoles/L (ref 4.0–15.4)
Sodium: 138 meq/L (ref 135–145)

## 2009-12-06 ENCOUNTER — Encounter: Payer: Self-pay | Admitting: Family Medicine

## 2009-12-16 ENCOUNTER — Encounter: Payer: Self-pay | Admitting: Family Medicine

## 2009-12-16 ENCOUNTER — Ambulatory Visit: Payer: Self-pay | Admitting: Family Medicine

## 2009-12-17 LAB — CONVERTED CEMR LAB
Chloride: 96 meq/L (ref 96–112)
Potassium: 3 meq/L — ABNORMAL LOW (ref 3.5–5.3)
Sodium: 140 meq/L (ref 135–145)
Testosterone: 172.36 ng/dL — ABNORMAL LOW (ref 350–890)

## 2009-12-30 ENCOUNTER — Ambulatory Visit: Payer: Self-pay | Admitting: Family Medicine

## 2010-01-13 ENCOUNTER — Encounter: Payer: Self-pay | Admitting: *Deleted

## 2010-01-13 ENCOUNTER — Encounter: Payer: Self-pay | Admitting: Family Medicine

## 2010-01-13 ENCOUNTER — Ambulatory Visit: Payer: Self-pay

## 2010-01-13 ENCOUNTER — Encounter: Admission: RE | Admit: 2010-01-13 | Discharge: 2010-01-13 | Payer: Self-pay | Admitting: Family Medicine

## 2010-01-13 LAB — CONVERTED CEMR LAB
BUN: 22 mg/dL (ref 6–23)
CO2: 32 meq/L (ref 19–32)
Chloride: 91 meq/L — ABNORMAL LOW (ref 96–112)
Eosinophils Absolute: 0 10*3/uL (ref 0.0–0.7)
Eosinophils Relative: 0 % (ref 0–5)
HCT: 48.2 % (ref 39.0–52.0)
Hemoglobin: 16.3 g/dL (ref 13.0–17.0)
Lymphocytes Relative: 23 % (ref 12–46)
Lymphs Abs: 1.7 10*3/uL (ref 0.7–4.0)
MCV: 101.3 fL — ABNORMAL HIGH (ref 78.0–100.0)
Monocytes Absolute: 0.8 10*3/uL (ref 0.1–1.0)
Monocytes Relative: 11 % (ref 3–12)
Platelets: 206 10*3/uL (ref 150–400)
Potassium: 3 meq/L — ABNORMAL LOW (ref 3.5–5.3)
RBC: 4.76 M/uL (ref 4.22–5.81)
WBC: 7.2 10*3/uL (ref 4.0–10.5)

## 2010-01-14 ENCOUNTER — Telehealth: Payer: Self-pay | Admitting: Family Medicine

## 2010-01-27 ENCOUNTER — Ambulatory Visit: Payer: Self-pay | Admitting: Family Medicine

## 2010-02-03 ENCOUNTER — Ambulatory Visit: Payer: Self-pay | Admitting: Family Medicine

## 2010-02-03 ENCOUNTER — Encounter: Payer: Self-pay | Admitting: Family Medicine

## 2010-02-03 LAB — CONVERTED CEMR LAB
BUN: 22 mg/dL (ref 6–23)
Creatinine, Ser: 1.29 mg/dL (ref 0.40–1.50)
Glucose, Bld: 101 mg/dL — ABNORMAL HIGH (ref 70–99)
Potassium: 3.2 meq/L — ABNORMAL LOW (ref 3.5–5.3)

## 2010-02-10 ENCOUNTER — Ambulatory Visit: Payer: Self-pay | Admitting: Family Medicine

## 2010-02-10 ENCOUNTER — Encounter: Payer: Self-pay | Admitting: Family Medicine

## 2010-02-24 ENCOUNTER — Ambulatory Visit: Payer: Self-pay | Admitting: Family Medicine

## 2010-02-27 ENCOUNTER — Telehealth: Payer: Self-pay | Admitting: Family Medicine

## 2010-03-10 ENCOUNTER — Ambulatory Visit: Payer: Self-pay | Admitting: Family Medicine

## 2010-03-25 ENCOUNTER — Ambulatory Visit: Payer: Self-pay | Admitting: Family Medicine

## 2010-04-02 ENCOUNTER — Ambulatory Visit: Payer: Self-pay | Admitting: Family Medicine

## 2010-04-02 ENCOUNTER — Encounter: Payer: Self-pay | Admitting: Family Medicine

## 2010-04-02 DIAGNOSIS — K219 Gastro-esophageal reflux disease without esophagitis: Secondary | ICD-10-CM | POA: Insufficient documentation

## 2010-04-02 DIAGNOSIS — R059 Cough, unspecified: Secondary | ICD-10-CM | POA: Insufficient documentation

## 2010-04-02 DIAGNOSIS — G8929 Other chronic pain: Secondary | ICD-10-CM | POA: Insufficient documentation

## 2010-04-02 DIAGNOSIS — R05 Cough: Secondary | ICD-10-CM

## 2010-04-02 LAB — CONVERTED CEMR LAB
AST: 17 units/L (ref 0–37)
Alkaline Phosphatase: 69 units/L (ref 39–117)
BUN: 17 mg/dL (ref 6–23)
Glucose, Bld: 111 mg/dL — ABNORMAL HIGH (ref 70–99)
Sodium: 139 meq/L (ref 135–145)
Total Bilirubin: 0.5 mg/dL (ref 0.3–1.2)

## 2010-04-08 ENCOUNTER — Ambulatory Visit: Payer: Self-pay | Admitting: Family Medicine

## 2010-04-22 ENCOUNTER — Ambulatory Visit: Payer: Self-pay | Admitting: Family Medicine

## 2010-05-01 ENCOUNTER — Ambulatory Visit: Payer: Self-pay | Admitting: Family Medicine

## 2010-05-01 ENCOUNTER — Encounter (INDEPENDENT_AMBULATORY_CARE_PROVIDER_SITE_OTHER): Payer: Self-pay | Admitting: Pharmacist

## 2010-06-02 ENCOUNTER — Ambulatory Visit: Payer: Self-pay | Admitting: Family Medicine

## 2010-06-03 ENCOUNTER — Telehealth: Payer: Self-pay | Admitting: Family Medicine

## 2010-06-04 ENCOUNTER — Encounter: Payer: Self-pay | Admitting: Family Medicine

## 2010-06-06 ENCOUNTER — Telehealth: Payer: Self-pay | Admitting: Family Medicine

## 2010-06-20 ENCOUNTER — Ambulatory Visit: Payer: Self-pay | Admitting: Family Medicine

## 2010-06-23 ENCOUNTER — Encounter: Payer: Self-pay | Admitting: Family Medicine

## 2010-06-23 ENCOUNTER — Ambulatory Visit: Payer: Self-pay | Admitting: Family Medicine

## 2010-06-23 LAB — CONVERTED CEMR LAB
CO2: 25 meq/L (ref 19–32)
Glucose, Bld: 113 mg/dL — ABNORMAL HIGH (ref 70–99)
Potassium: 4.1 meq/L (ref 3.5–5.3)
Sodium: 142 meq/L (ref 135–145)

## 2010-06-30 ENCOUNTER — Telehealth: Payer: Self-pay | Admitting: Family Medicine

## 2010-07-07 ENCOUNTER — Ambulatory Visit: Payer: Self-pay | Admitting: Family Medicine

## 2010-07-21 ENCOUNTER — Ambulatory Visit: Payer: Self-pay | Admitting: Family Medicine

## 2010-07-22 ENCOUNTER — Encounter: Payer: Self-pay | Admitting: Family Medicine

## 2010-07-24 ENCOUNTER — Telehealth: Payer: Self-pay | Admitting: Family Medicine

## 2010-07-28 ENCOUNTER — Telehealth: Payer: Self-pay | Admitting: Family Medicine

## 2010-08-13 ENCOUNTER — Encounter: Payer: Self-pay | Admitting: *Deleted

## 2010-08-13 ENCOUNTER — Ambulatory Visit: Payer: Self-pay | Admitting: Family Medicine

## 2010-08-19 ENCOUNTER — Encounter: Payer: Self-pay | Admitting: Family Medicine

## 2010-08-19 ENCOUNTER — Ambulatory Visit: Payer: Self-pay | Admitting: Family Medicine

## 2010-08-19 DIAGNOSIS — R51 Headache: Secondary | ICD-10-CM | POA: Insufficient documentation

## 2010-08-19 DIAGNOSIS — R519 Headache, unspecified: Secondary | ICD-10-CM | POA: Insufficient documentation

## 2010-08-21 LAB — CONVERTED CEMR LAB
BUN: 22 mg/dL
CO2: 30 meq/L
Calcium: 11 mg/dL — ABNORMAL HIGH
Chloride: 100 meq/L
Creatinine, Ser: 1.43 mg/dL
Glucose, Bld: 111 mg/dL — ABNORMAL HIGH
Potassium: 3.9 meq/L
Sodium: 138 meq/L

## 2010-08-25 ENCOUNTER — Ambulatory Visit: Payer: Self-pay | Admitting: Family Medicine

## 2010-08-25 ENCOUNTER — Telehealth: Payer: Self-pay | Admitting: Family Medicine

## 2010-09-03 ENCOUNTER — Encounter: Payer: Self-pay | Admitting: Family Medicine

## 2010-09-03 ENCOUNTER — Ambulatory Visit: Payer: Self-pay | Admitting: Family Medicine

## 2010-09-03 LAB — CONVERTED CEMR LAB
CO2: 28 meq/L (ref 19–32)
Chloride: 100 meq/L (ref 96–112)
Creatinine, Ser: 1.23 mg/dL (ref 0.40–1.50)
Potassium: 4.3 meq/L (ref 3.5–5.3)

## 2010-09-17 ENCOUNTER — Ambulatory Visit: Payer: Self-pay | Admitting: Family Medicine

## 2010-09-22 ENCOUNTER — Telehealth: Payer: Self-pay | Admitting: Family Medicine

## 2010-10-01 ENCOUNTER — Ambulatory Visit: Payer: Self-pay

## 2010-10-06 ENCOUNTER — Encounter: Payer: Self-pay | Admitting: Family Medicine

## 2010-10-15 ENCOUNTER — Ambulatory Visit
Admission: RE | Admit: 2010-10-15 | Discharge: 2010-10-15 | Payer: Self-pay | Source: Home / Self Care | Attending: Family Medicine | Admitting: Family Medicine

## 2010-10-22 ENCOUNTER — Telehealth: Payer: Self-pay | Admitting: Family Medicine

## 2010-10-28 ENCOUNTER — Ambulatory Visit: Admission: RE | Admit: 2010-10-28 | Payer: Self-pay | Source: Home / Self Care

## 2010-10-28 ENCOUNTER — Encounter: Payer: Self-pay | Admitting: *Deleted

## 2010-11-04 NOTE — Progress Notes (Signed)
  Phone Note Call from Patient   Caller: Patient Call For: [phone] Summary of Call: Jeffrey [name] was unable to have the sleep study due to his insurance not covering service. Initial call taken by: Abundio Miu,  July 24, 2010 2:29 PM

## 2010-11-04 NOTE — Progress Notes (Signed)
Summary: refill-completed and placed in front   Phone Note Refill Request Call back at Home Phone [phone] Message from:  Patient  Refills Requested: Medication #1:  PERCOCET 10-650 MG TABS Take 1 tablet by mouth QID PRN  Medication #2:  FENTANYL 25 MCG/HR PT72 Apply 1 patch to skin as directed q 72 hrs Please call when ready  Initial call taken by: De Nurse,  June 30, 2010 2:29 PM

## 2010-11-04 NOTE — Progress Notes (Signed)
Summary: Rx Req  Phone Note Refill Request Call back at Home Phone [phone] Message from:  Patient  Refills Requested: Medication #1:  PERCOCET 10-650 MG TABS Take 1 tablet by mouth QID PRN  Medication #2:  FENTANYL 25 MCG/HR PT72 Apply 1 patch to skin as directed q 72 hrs Initial call taken by: Clydell Hakim,  July 28, 2010 3:33 PM    Prescriptions: FENTANYL 25 MCG/HR PT72 (FENTANYL) Apply 1 patch to skin as directed q 72 hrs  #10 x 0   Entered and Authorized by:   Doree Albee MD   Signed by:   Doree Albee MD on 07/28/2010   Method used:   Print then Give to Patient   RxID:   [id number] PERCOCET 10-650 MG TABS (OXYCODONE-ACETAMINOPHEN) Take 1 tablet by mouth QID PRN  #100 x 0   Entered and Authorized by:   Doree Albee MD   Signed by:   Doree Albee MD on 07/28/2010   Method used:   Print then Give to Patient   RxID:   [id number]

## 2010-11-04 NOTE — Assessment & Plan Note (Signed)
Summary: injection/eo  Nurse Visit   Allergies: 1)  ! Pcn  Medication Administration  Injection # 1:    Medication: Testosterone Cypionat 200mg  ing    Diagnosis: TESTOSTERONE DEFICIENCY (ICD-257.2)    Route: IM    Site: L thigh    Exp Date: 08/2012    Lot #: [id number]    Mfr: SANDOZ    Comments:  testosterone 100 mg per ml , 2 ml given for total of 200 mg as directed.  patient bring medication to office, no caharge for medication.    Patient tolerated injection without complications    Given by: Theresia Lo RN (April 08, 2010 4:26 PM)  Orders Added: 1)  Admin of Injection (IM/SQ) [phone]   Medication Administration  Injection # 1:    Medication: Testosterone Cypionat 200mg  ing    Diagnosis: TESTOSTERONE DEFICIENCY (ICD-257.2)    Route: IM    Site: L thigh    Exp Date: 08/2012    Lot #: [id number]    Mfr: SANDOZ    Comments:  testosterone 100 mg per ml , 2 ml given for total of 200 mg as directed.  patient bring medication to office, no caharge for medication.    Patient tolerated injection without complications    Given by: Theresia Lo RN (April 08, 2010 4:26 PM)  Orders Added: 1)  Admin of Injection (IM/SQ) [phone]  patient inquiring about results of resent  labs. will send message to Dr. Alvester Morin to advise. Theresia Lo RN  April 08, 2010 4:29 PM  Called and left message for pt that test results were normal. Doree Albee MD April 09, 2010 9:06 PM   Appended Document: injection/eo above site for injection was entered in error . injection was given LUOQ not left thigh.

## 2010-11-04 NOTE — Assessment & Plan Note (Signed)
Summary: fu/kh   Vital Signs:  Patient profile:   50 year old male Height:      71 inches Weight:      238 pounds BMI:     33.31 Temp:     98.1 degrees F oral Pulse rate:   68 / minute BP sitting:   154 / 100  (left arm) Cuff size:   regular  Vitals Entered By: Tessie Fass CMA (November 27, 2009 1:50 PM) CC: F/U BP Is Patient Diabetic? No Pain Assessment Patient in pain? yes     Location: left ankle Intensity: 7   Primary Care Provider:  Eustaquio Boyden  MD  CC:  F/U BP.  History of Present Illness: CC: f/u issues  50yo M with h/o hypotestosteronism s/p biweekly testosterone IM shots who was admitted 07/22/2009 with ARF to Cr 4.6 presumed 2/2 obstructive from BPH.  Hospital labs/imaging: renal US WNL (2 nonobstructive stones), SPEP UPEP normal, ANA WNL, ASO titer normal, ESR elevated at 71. After starting cardura, IVF, and holding gabapentin, lisinopril, HCTZ, Cr slowly trended downward to 1.44 early February.  Saw urologist and told ok to restart testosterone.  Has noted 10 lb weight gain since last visit.  states no increased by mouth intake, but does endorse decreased activity.  1. HTN - today 150/100.  On metoprolol 100 BID, cardura 2mg  and HCTZ 12.5 (although has run out of HCTZ and states too expensive)    2. hypotestosterone - last testosterone injection (200mg  q2wks) prior to hospitalization.  Testosterone while in hospital was 440.  After admission to hospital, testosterone has been held.  recheck test 124.  Has family history of hypotestosteronism.  Did not like gel, too expensive and poor results.  Plan is to f/u with urology in 02/2010 with KUB.  3. BPH - Started cardura and has noted significant improvement in voiding.  Dr. Annabell Howells is urologist.  Voiding normally now.  4. chronic left leg pain - started about 3 yrs ago after fracture of that ankle.  characterizes pain as burning sensation only around left ankle.  had improved on gabapentin, but with ARF held.   taking now 300mg  at bedtime.  States pain has worsened since he had MRI of hips last week.  Now cannot get out and walk.  chronic back pain on disability from herniated disc, B AVN of hips s/p replacement with dupuy - chronic fentanyl/percocet.  undergoing w/u to see if depuy faulty.  6. smoking - now down to 5 cig/day.  very interested in chantix as this worked well previously.  Habits & Providers  Alcohol-Tobacco-Diet     Tobacco Status: current     Cigarette Packs/Day: 0.5  Allergies (verified): 1)  ! Pcn PMH-FH-SH reviewed for relevance  Social History: Packs/Day:  0.5  Physical Exam  General:  alert, well-developed, well-nourished, well-hydrated, normal appearance, and cooperative to examination.   Lungs:  normal respiratory effort, no intercostal retractions, no accessory muscle use, normal breath sounds, no crackles, and no wheezes.   Heart:  normal rate, no gallop, and no rub.   Abdomen:  soft, non-tender, normal bowel sounds, no distention, no masses, no guarding, and no rebound tenderness.   Extremities:  No clubbing, cyanosis, edema Skin:  Intact without suspicious lesions or rashes   Impression & Recommendations:  Problem # 1:  RENAL FAILURE, ACUTE (ICD-584.9) check Cr again today.  slowly resolving nicely.  increase HCTZ, consider addition of ACEI next visit. Labs Reviewed: BUN: 18 (11/05/2009)   Cr: 1.44 (11/05/2009)  Hgb: 16.5 (07/06/2008)   Hct: 50.4 (07/06/2008)   Ca++: 10.3 (11/05/2009)    TP: 7.4 (08/01/2009)   Alb: 4.5 (08/01/2009)  Orders: Basic Met-[facility] ([address]-60454) Miscellaneous Lab Charge-[facility] (09811) [facility]- Est  Level 4 (91478)  Problem # 2:  TESTOSTERONE DEFICIENCY (ICD-257.2)  Start IM shots today, 200mg /mL - 1 cc q 2 wks, return in 2 wks for 2nd shot, return in 1 wk after that for testosterone check (free and total)  Orders: [facility]- Est  Level 4 (29562) Admin of Therapeutic Inj  intramuscular or subcutaneous (96372)Future  Orders: Testosterone-[facility] (13086-57846) ... 11/20/2010  Problem # 3:  ESSENTIAL HYPERTENSION (ICD-401.9) elevated, likely 2/2 weight gain and decreased activity.  have asked to increase HCTZ to 25 daily, consider addition of norvasc vs ACEI in near future.  His updated medication list for this problem includes:    Metoprolol Tartrate 100 Mg Tabs (Metoprolol tartrate) ..... One tab by mouth bid    Hydrochlorothiazide 25 Mg Tabs (Hydrochlorothiazide) .[name]... Take one by mouth daily    Doxazosin Mesylate 2 Mg Tabs (Doxazosin mesylate) .[name]... Take one po daily  Orders: [facility]- Est  Level 4 ([address]214)  BP today: 154/100 Prior BP: 142/90 (11/05/2009)  Labs Reviewed: K+: 3.8 (11/05/2009) Creat: : 1.44 (11/05/2009)   Chol: 152 (07/19/2009)   HDL: 41 (07/19/2009)   LDL: 81 (07/19/2009)   TG: 148 (07/19/2009)  Problem # 4:  Hx of CLOSED FRACTURE OF LATERAL MALLEOLUS (ICD-824.2)  increasing gabapentin to 300mg  two times a day.  check MMA and homocysteine to f/u on borderline B12 level.  (07/23/2009) - B12 300 (sl low), RBC folate 258 (WNL)  Problem # 5:  Hx of BENIGN PROSTATIC HYPERTROPHY, WITH OBSTRUCTION (ICD-600.01) continue cardura 2mg  once daily.  check PSA at least yearly, more frequently if concerns (either every october or every october and april).  Problem # 6:  HYPERTRIGLYCERIDEMIA (ICD-272.1) stable on fish oil  Labs Reviewed: SGOT: 16 (08/01/2009)   SGPT: 14 (08/01/2009)   HDL:41 (07/19/2009), 50 (07/06/2008)  LDL:81 (07/19/2009), 74 (07/06/2008)  Chol:152 (07/19/2009), 140 (07/06/2008)  Trig:148 (07/19/2009), 82 (07/06/2008)  Problem # 7:  TOBACCO ABUSE (ICD-305.1) continue holding off on chantix for now. pt very motivated, but not interested in classes.  Complete Medication List: 1)  Fentanyl 25 Mcg/hr Pt72 (Fentanyl) .... Apply 1 patch to skin as directed q 72 hrs 2)  Percocet 10-650 Mg Tabs (Oxycodone-acetaminophen) .... Take 1 tablet by mouth qid prn 3)  Metoprolol Tartrate 100  Mg Tabs (Metoprolol tartrate) .... One tab by mouth bid 4)  Hydrochlorothiazide 25 Mg Tabs (Hydrochlorothiazide) .... Take one by mouth daily 5)  Celexa 40 Mg Tabs (Citalopram hydrobromide) .... Take one tablet daily by mouth 6)  Eql Fish Oil 1000 Mg Caps (Omega-3 fatty acids) .... 2 tabs twice daily 7)  Aspirin 325 Mg Tabs (Aspirin) .... Take 1 tab by mouth every day 8)  Testosterone Cypionate 100 Mg/ml Oil (Testosterone cypionate) .... 200 mg im every 2 weeks 1 vial (qs one month) 9)  Protonix 40 Mg Pack (Pantoprazole sodium) .... Take one by mouth daily 10)  Neurontin 300 Mg Caps (Gabapentin) .... Take one by mouth two times a day 11)  Doxazosin Mesylate 2 Mg Tabs (Doxazosin mesylate) .... Take one po daily  Patient Instructions: 1)  Return in 1 month for follow up on testosterone level and blood pressure. 2)  Please return in 2 wks for second testosterone shot, then in 3 weeks (1 week after second shot) for testosterone check. 3)  Increase your Hydrochlorothiaze to 25mg  daily (2 pills until you fill new prescription). 4)  Increase gabapentin to 300mg  twice daily - if you start feeling dizzy, decrease back to nightly. 5)  We drew blood work today. 6)  Call clinic with questions Prescriptions: CELEXA 40 MG TABS (CITALOPRAM HYDROBROMIDE) take one tablet daily by mouth  #31 Tablet x 3   Entered and Authorized by:   Eustaquio Boyden  MD   Signed by:   Eustaquio Boyden  MD on 11/28/2009   Method used:   [facility]        [facility] [address] [phone]* (retail)       [address]       [place], [place]  [address]       Ph: [phone] or [phone]       Fax: [phone]   RxID:   [id number] NEURONTIN 300 MG CAPS (GABAPENTIN) take one by mouth two times a day  #60 x 1   Entered and Authorized by:   Eustaquio Boyden  MD   Signed by:   Eustaquio Boyden  MD on 11/28/2009   Method used:   Electronically to        [facility] [address] [phone]* (retail)        [address]       [place], [place]  [address]       Ph: [phone] or [phone]       Fax: [phone]   RxID:   [id number] HYDROCHLOROTHIAZIDE 25 MG TABS (HYDROCHLOROTHIAZIDE) take one by mouth daily  #32 x 3   Entered and Authorized by:   Eustaquio Boyden  MD   Signed by:   Eustaquio Boyden  MD on 11/27/2009   Method used:   Print then Give to Patient   RxID:   [id number] TESTOSTERONE CYPIONATE 100 MG/ML  OIL (TESTOSTERONE CYPIONATE) 200 mg IM every 2 weeks 1 vial (QS one month)  #1 x 3   Entered and Authorized by:   Eustaquio Boyden  MD   Signed by:   Eustaquio Boyden  MD on 11/27/2009   Method used:   Print then Give to Patient   RxID:   [id number] HYDROCHLOROTHIAZIDE 25 MG TABS (HYDROCHLOROTHIAZIDE) take one by mouth daily  #32 x 3   Entered and Authorized by:   Eustaquio Boyden  MD   Signed by:   Eustaquio Boyden  MD on 11/27/2009   Method used:   [facility]ronically to        [facility] [address] [phone]* (retail)       [address]       [place], [place]  [address]       Ph: [phone] or [phone]       Fax: [phone]   RxID:   [id number]    Medication Administration  Injection # 1:    Medication: Testosterone Cypionat 200mg  ing    Diagnosis: TESTOSTERONE DEFICIENCY (ICD-257.2)    Route: IM    Site: LUOQ gluteus    Exp Date: 02/2011    Lot #: [id number]    Mfr: [email or web address]    Comments: no charge, pt brought in own medication    Patient tolerated injection without complications    Given by: Tessie Fass CMA (November 27, 2009 2:52 PM)  Orders Added: 1)  Basic Met-[facility] [67124-58099] 2)  Miscellaneous Lab  Charge-[facility] [99999] 3)  [facility]- Est  Level 4 [99214] 4)  Admin of Therapeutic Inj  intramuscular or subcutaneous [96372] 5)  Testosterone-[facility] [30865-78469]     Prevention & Chronic Care Immunizations   Influenza vaccine: Fluvax MCR  (07/19/2009)   Influenza vaccine due: 07/03/2009     Tetanus booster: 05/05/2004: Done.   Tetanus booster due: 05/05/2014    Pneumococcal vaccine: Not documented  Other Screening   Smoking status: current  (11/27/2009)   Smoking cessation counseling: yes  (08/29/2008)  Lipids   Total Cholesterol: 152  (07/19/2009)   LDL: 81  (07/19/2009)   LDL Direct: Not documented   HDL: 41  (07/19/2009)   Triglycerides: 148  (07/19/2009)    SGOT (AST): 16  (08/01/2009)   SGPT (ALT): 14  (08/01/2009)   Alkaline phosphatase: 62  (08/01/2009)   Total bilirubin: 0.3  (08/01/2009)    Lipid flowsheet reviewed?: Yes   Progress toward LDL goal: At goal  Hypertension   Last Blood Pressure: 154 / 100  (11/27/2009)   Serum creatinine: 1.44  (11/05/2009)   Serum potassium 3.8  (11/05/2009)    Hypertension flowsheet reviewed?: Yes   Progress toward BP goal: Deteriorated  Self-Management Support :   Personal Goals (by the next clinic visit) :      Personal blood pressure goal: 140/90  (07/19/2009)     Personal LDL goal: 100  (07/19/2009)    Hypertension self-management support: BP self-monitoring log, Written self-care plan, Education handout  (11/27/2009)   Hypertension self-care plan printed.   Hypertension education handout printed    Hypertension self-management support not done because: Good outcomes  (11/06/2009)    Lipid self-management support: Not documented     Lipid self-management support not done because: Good outcomes  (11/06/2009)

## 2010-11-04 NOTE — Assessment & Plan Note (Signed)
Summary: cough, congestion, chills, ? fever/ls   Vital Signs:  Patient profile:   50 year old male Weight:      225 pounds Temp:     97 degrees F oral Pulse rate:   64 / minute BP sitting:   108 / 68  (right arm)  Vitals Entered By: Renato Battles slade,cma CC: vomitting x 1. cough x 6 days. Is Patient Diabetic? No Pain Assessment Patient in pain? no        Primary Care Provider:  Eustaquio Boyden  MD  CC:  vomitting x 1. cough x 6 days.[name]  History of Present Illness: CC: cough, chills  6d h/o coughing and pleurisy.  3 d h/o vomiting, NBNB, just food, decreased appetite.  Nauseated.  Not post tussive.  + night sweats and subjective fever.  Mild abd pain.  Mild diarrhea.  Drinking ok mostly water and sprites.  Has lost 13 lbs since last seen here 11/2009.  + ST, rhinorrhea, congestion and HA.  No PND.  + increased sputum production.  Hasn't been smoking since sick.  Father with PNA last week hospitalized.  Habits & Providers  Alcohol-Tobacco-Diet     Tobacco Status: current     Tobacco Counseling: to quit use of tobacco products  Current Medications (verified): 1)  Fentanyl 25 Mcg/hr Pt72 (Fentanyl) .... Apply 1 Patch To Skin As Directed Q 72 Hrs 2)  Percocet 10-650 Mg Tabs (Oxycodone-Acetaminophen) .... Take 1 Tablet By Mouth Qid Prn 3)  Metoprolol Tartrate 100 Mg Tabs (Metoprolol Tartrate) .... One Tab By Mouth Bid 4)  Hydrochlorothiazide 25 Mg Tabs (Hydrochlorothiazide) .... Take One By Mouth Daily 5)  Celexa 40 Mg Tabs (Citalopram Hydrobromide) .... Take One Tablet Daily By Mouth 6)  Eql Fish Oil 1000 Mg  Caps (Omega-3 Fatty Acids) .... 2 Tabs Twice Daily 7)  Aspirin 325 Mg Tabs (Aspirin) .... Take 1 Tab By Mouth Every Day 8)  Testosterone Cypionate 100 Mg/ml  Oil (Testosterone Cypionate) .... 200 Mg Im Every 2 Weeks 1 Vial (Qs One Month) 9)  Protonix 40 Mg Pack (Pantoprazole Sodium) .... Take One By Mouth Daily 10)  Neurontin 300 Mg Caps (Gabapentin) .... Take One By Mouth  Two Times A Day 11)  Doxazosin Mesylate 2 Mg Tabs (Doxazosin Mesylate) .... Take One Po Daily 12)  Proventil Hfa 108 (90 Base) Mcg/act Aers (Albuterol Sulfate) .... or Equivalent.  2 Puffs Q4-6 Hours As Needed Wheezing  Allergies (verified): 1)  ! Pcn  Past History:  Past medical, surgical, family and social histories (including risk factors) reviewed for relevance to current acute and chronic problems.  Past Medical History: Reviewed history from 08/01/2009 and no changes required. 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  was on coumadin, now on ASA per Dr. Pearlean Brownie.  Had bubble TEE study which found hole in hear, no records.  IVC filter in place (Left parietal infarct due to PFO-6/04) h/o L post malleolar fracture 2004 Depression ASD Testosterone deficiency Tobacco abuse HTN  Past Surgical History: Reviewed history from 11/05/2009 and no changes required. 2-D Echo-LVEF 55-65% - 03/06/2003, MRI Brain-acute cortical infarct L. parietal lobe, embolic appearing - 03/06/2003 MRI-Lumbar Spine-sign degeneration, 3 level disc protrusions, canal stenosis - 03/05/2005 TEE-right to left shunt suggestive of PFO or ASD. - 03/06/2003 IVC Filter placement - 12/03/2005 R hip replacement 3/08 L Hip Replacement -  12/03/2005 Renal US 07/2009 - no hydro, probable nonobstructive L renal calculus (07/23/2009) - B12 300 (sl low), RBC folate 258 (WNL)  Family History: Reviewed history from 08/01/2009 and no changes required. Father with severe DDD with back fusion and hypotestosteronism on shots Several family members with DM.  Social History: Reviewed history from 08/01/2009 and no changes required. Currently renting house, smokes only outside, down to 3/4 ppd.  recent divorce from wife Bibi.  His sister has pancreatic cancer and at times living with him.  Is now on  disability secondary to DDD.  No kids.  Has financial difficulty.  Smoker.   Physical Exam  General:  Well-developed,well-nourished,in no acute distress; alert,appropriate and cooperative throughout examination.  tired appearing, ill Mouth:  pharynx pink and moist and no erythema.   Neck:  supple and full ROM.   Lungs:  + exp wheezing throughout, rhonchorous, normal WOB Heart:  normal rate, no gallop, and no rub.   Extremities:  No clubbing, cyanosis, edema Skin:  Intact without suspicious lesions or rashes   Impression & Recommendations:  Problem # 1:  COUGH (ICD-786.2) with subjective fevers.  check CXR today.  check CBC as well.  advised to hold off on 3 meds while feeling sick.  LIkely COPD in this longstanding smoker.  Will treat as COPD exacerbation.  Will need PFTs at f/u to evaluate pulm status and likely need to start flovent/spiriva.  has been on advair in past.  CXR reviewed - no acute process.  + chronic bronchitis picture.  Orders: CXR- 2view (CXR) CBC w/Diff-[facility] (85025) [facility]- Est  Level 4 (91478)  Problem # 2:  ESSENTIAL HYPERTENSION (ICD-401.9) Check Cr and K to follow up on hypokalemia.  pt has been taking potassium daily.  recommended holding HCTZ while not taking good by mouth. His updated medication list for this problem includes:    Metoprolol Tartrate 100 Mg Tabs (Metoprolol tartrate) ..... One tab by mouth bid    Hydrochlorothiazide 25 Mg Tabs (Hydrochlorothiazide) .[name]... Take one by mouth daily    Doxazosin Mesylate 2 Mg Tabs (Doxazosin mesylate) .[name]... Take one po daily  Orders: Basic Met-[facility] [id number]) [facility]- Est  Level 4 ([address]214)  BP today: 108/68 Prior BP: 154/100 (11/27/2009)  Labs Reviewed: K+: 3.0 (12/16/2009) Creat: : 1.23 (12/16/2009)   Chol: 152 (07/19/2009)   HDL: 41 (07/19/2009)   LDL: 81 (07/19/2009)   TG: 148 (07/19/2009)  Problem # 3:  RENAL FAILURE, ACUTE (ICD-584.9) f/u with Cr.  Complete Medication List: 1)  Fentanyl 25 Mcg/hr Pt72  (Fentanyl) .... Apply 1 patch to skin as directed q 72 hrs 2)  Percocet 10-650 Mg Tabs (Oxycodone-acetaminophen) .... Take 1 tablet by mouth qid prn 3)  Metoprolol Tartrate 100 Mg Tabs (Metoprolol tartrate) .... One tab by mouth bid 4)  Hydrochlorothiazide 25 Mg Tabs (Hydrochlorothiazide) .... Take one by mouth daily 5)  Celexa 40 Mg Tabs (Citalopram hydrobromide) .... Take one tablet daily by mouth 6)  Eql Fish Oil 1000 Mg Caps (Omega-3 fatty acids) .... 2 tabs twice daily 7)  Aspirin 325 Mg Tabs (Aspirin) .... Take 1 tab by mouth every day 8)  Testosterone Cypionate 100 Mg/ml Oil (Testosterone cypionate) .... 200 mg im every 2 weeks 1 vial (qs one month) 9)  Protonix 40 Mg Pack (Pantoprazole sodium) .... Take one by mouth daily 10)  Neurontin 300 Mg Caps (Gabapentin) .... Take one by mouth two times a day 11)  Doxazosin Mesylate 2 Mg Tabs (Doxazosin mesylate) .... Take one po  daily 12)  Proventil Hfa 108 (90 Base) Mcg/act Aers (Albuterol sulfate) .... Or equivalent.  2 puffs q4-6 hours as needed wheezing 13)  Doxycycline Monohydrate 100 Mg Caps (Doxycycline monohydrate) .... One by mouth two times a day x 10 days 14)  Prednisone 50 Mg Tabs (Prednisone) .... One daily for 10 days  Patient Instructions: 1)  Until you feel better, hold aspirin, hydrochlorothiazide, and percocet. 2)  Xray of your lungs today to look for pneumonia. 3)  Albuterol inhaler 2 puffs every 4-6 hours as needed for shortness of breath/wheezing. 4)  Stop smoking.  I will call you with results of chest xray and whether you need antibiotics. Prescriptions: PREDNISONE 50 MG TABS (PREDNISONE) one daily for 10 days  #10 x 0   Entered and Authorized by:   Eustaquio Boyden  MD   Signed by:   Eustaquio Boyden  MD on 01/13/2010   Method used:   [facility]ron[facility] to        [facility] [address] [phone]* (retail)       [address]       [place], [place]  [address]       Ph: [phone] or [phone]        Fax: [phone]   RxID:   [id number] DOXYCYCLINE MONOHYDRATE 100 MG CAPS (DOXYCYCLINE MONOHYDRATE) one by mouth two times a day x 10 days  #20 x 0   Entered and Authorized by:   Eustaquio Boyden  MD   Signed by:   Eustaquio Boyden  MD on 01/13/2010   Method used:   Electronically to        [facility] [address] [phone]* (retail)       [address]       [place], [place]  [address]       Ph: [phone] or [phone]       Fax: [phone]   RxID:   [id number] PROVENTIL HFA 108 (90 BASE) MCG/ACT AERS (ALBUTEROL SULFATE) or equivalent.  2 puffs q4-6 hours as needed wheezing  #1 x 1   Entered and Authorized by:   Eustaquio Boyden  MD   Signed by:   Eustaquio Boyden  MD on 01/13/2010   Method used:   Electronically to        [facility] [address] [phone]* (retail)       [address]       [place], [place]  [address]       Ph: [phone] or [phone]       Fax: [phone]   RxID:   [id number]

## 2010-11-04 NOTE — Progress Notes (Signed)
Summary: refill  Phone Note Refill Request Call back at Home Phone [phone] Message from:  Patient  Refills Requested: Medication #1:  PERCOCET 10-650 MG TABS Take 1 tablet by mouth QID PRN  Medication #2:  FENTANYL 25 MCG/HR PT72 Apply 1 patch to skin as directed q 72 hrs Please call when ready  Initial call taken by: De Nurse,  August 25, 2010 10:29 AM    Prescriptions: PERCOCET 10-650 MG TABS (OXYCODONE-ACETAMINOPHEN) Take 1 tablet by mouth QID PRN  #100 x 0   Entered and Authorized by:   Doree Albee MD   Signed by:   Doree Albee MD on 08/26/2010   Method used:   Print then Give to Patient   RxID:   [id number] FENTANYL 25 MCG/HR PT72 (FENTANYL) Apply 1 patch to skin as directed q 72 hrs  #10 x 0   Entered and Authorized by:   Doree Albee MD   Signed by:   Doree Albee MD on 08/26/2010   Method used:   Print then Give to Patient   RxID:   [id number] FENTANYL 25 MCG/HR PT72 (FENTANYL) Apply 1 patch to skin as directed q 72 hrs  #10 x 0   Entered and Authorized by:   Doree Albee MD   Signed by:   Doree Albee MD on 08/26/2010   Method used:   Print then Give to Patient   RxID:   [id number] PERCOCET 10-650 MG TABS (OXYCODONE-ACETAMINOPHEN) Take 1 tablet by mouth QID PRN  #100 x 0   Entered and Authorized by:   Doree Albee MD   Signed by:   Doree Albee MD on 08/26/2010   Method used:   Print then Give to Patient   RxID:   [id number]   Appended Document: refill Rx filled once. As printer didnt work w/ first refill attempt.  Doree Albee MD

## 2010-11-04 NOTE — Letter (Signed)
Summary: [facility]   Imported By: Clydell Hakim 03/04/2010 16:44:00  _____________________________________________________________________  External Attachment:    Type:   Image     Comment:   External Document

## 2010-11-04 NOTE — Miscellaneous (Signed)
Summary: RX-completed w/ parameters  Prescriptions: FENTANYL 25 MCG/HR PT72 (FENTANYL) Apply 1 patch to skin as directed q 72 hrs  #10 x 0   Entered and Authorized by:   Doree Albee MD   Signed by:   Doree Albee MD on 06/06/2010   Method used:   Print then Give to Patient   RxID:   [id number]  Patient needs refill of Fentanyl Patches.  He also was inquiring about Potassium.   Jeffrey [name]  June 04, 2010 4:48 PM  We refill. Pt needs to see me before ANY additional pain meds are given.Doree Albee MD June 06, 2010 1:44 PM

## 2010-11-04 NOTE — Progress Notes (Signed)
Summary: refill-completed   Phone Note Refill Request Call back at Home Phone [phone] Message from:  Patient  Refills Requested: Medication #1:  PERCOCET 10-650 MG TABS Take 1 tablet by mouth QID PRN Please call when ready  Initial call taken by: De Nurse,  June 03, 2010 4:08 PM  Follow-up for Phone Call        Call and spoke to pt that rx would be ready for pick up later on this pm as well as reminder for new MD appt on 9/19. Doree Albee MD June 04, 2010 11:52 AM

## 2010-11-04 NOTE — Assessment & Plan Note (Signed)
Summary: injection/eo  Nurse Visit   Allergies: 1)  ! Pcn  Medication Administration  Injection # 1:    Medication: Testosterone Cypionat 200mg  ing    Diagnosis: TESTOSTERONE DEFICIENCY (ICD-257.2)    Route: IM    Site: RUOQ gluteus    Exp Date: 04/2011    Lot #: [id number]    Mfr: sandoz    Comments: patient brings in his own medication. no charge for medication. testosterone  cypionate  200 mg given  ( 100 mg per ml, 2 ml given. )    Patient tolerated injection without complications    Given by: Theresia Lo RN (January 13, 2010 1:50 PM)  Orders Added: 1)  Admin of Injection (IM/SQ) [[id number]]   Medication Administration  Injection # 1:    Medication: Testosterone Cypionat 200mg  ing    Diagnosis: TESTOSTERONE DEFICIENCY (ICD-257.2)    Route: IM    Site: RUOQ gluteus    Exp Date: 04/2011    Lot #: [id number]    Mfr: sandoz    Comments: patient brings in his own medication. no charge for medication. testosterone  cypionate  200 mg given  ( 100 mg per ml, 2 ml given. )    Patient tolerated injection without complications    Given by: Theresia Lo RN (January 13, 2010 1:50 PM)  Orders Added: 1)  Admin of Injection (IM/SQ) [phone]

## 2010-11-04 NOTE — Assessment & Plan Note (Signed)
Summary: injection/kathy f ok'd/eo  Nurse Visit   Allergies: 1)  ! Pcn  Medication Administration  Injection # 1:    Medication: Testosterone Cypionat 200mg  ing    Diagnosis: TESTOSTERONE DEFICIENCY (ICD-257.2)    Route: IM    Site: LUOQ gluteus    Exp Date: 02/03/2013    Lot #: [id number]    Mfr: sandoz    Comments: patient brings his own medication. no charge for med.  testosterone  100 mg per Ml   ( 2 ml given )    Patient tolerated injection without complications    Given by: Theresia Lo RN (August 25, 2010 5:52 PM)  Orders Added: 1)  Admin of Injection (IM/SQ) [[id number]]   Medication Administration  Injection # 1:    Medication: Testosterone Cypionat 200mg  ing    Diagnosis: TESTOSTERONE DEFICIENCY (ICD-257.2)    Route: IM    Site: LUOQ gluteus    Exp Date: 02/03/2013    Lot #: [id number]    Mfr: sandoz    Comments: patient brings his own medication. no charge for med.  testosterone  100 mg per Ml   ( 2 ml given )    Patient tolerated injection without complications    Given by: Theresia Lo RN (August 25, 2010 5:52 PM)  Orders Added: 1)  Admin of Injection (IM/SQ) [phone]

## 2010-11-04 NOTE — Assessment & Plan Note (Signed)
Summary: SHOT/KH  Nurse Visit   Allergies: 1)  ! Pcn  Medication Administration  Injection # 1:    Medication: Testosterone Cypionat 200mg  ing    Diagnosis: TESTOSTERONE DEFICIENCY (ICD-257.2)    Route: IM    Site: LUOQ gluteus    Exp Date: 08/2012    Lot #: [id number]    Mfr: sandoz    Comments: testosterone cypionate 100 mg / ml 2 ml given IM    Patient tolerated injection without complications    Given by: Theresia Lo RN (March 25, 2010 4:27 PM)  Orders Added: 1)  Testosterone Cypionat 200mg  ing [J1080] 2)  Admin of Injection (IM/SQ) [[id number]]   Medication Administration  Injection # 1:    Medication: Testosterone Cypionat 200mg  ing    Diagnosis: TESTOSTERONE DEFICIENCY (ICD-257.2)    Route: IM    Site: LUOQ gluteus    Exp Date: 08/2012    Lot #: [id number]    Mfr: sandoz    Comments: testosterone cypionate 100 mg / ml 2 ml given IM    Patient tolerated injection without complications    Given by: Theresia Lo RN (March 25, 2010 4:27 PM)  Orders Added: 1)  Testosterone Cypionat 200mg  ing [J1080] 2)  Admin of Injection (IM/SQ) [[id number]]  no charge for medication , patient brings in his own medication. Theresia Lo RN  March 25, 2010 4:28 PM

## 2010-11-04 NOTE — Assessment & Plan Note (Signed)
Summary: Followup on Multiple Medical Problems   Vital Signs:  Patient profile:   50 year old male Height:      71 inches Weight:      233 pounds BMI:     32.61 Pulse rate:   74 / minute BP sitting:   146 / 95  (left arm) Cuff size:   regular  Vitals Entered By: Tessie Fass CMA (August 19, 2010 1:34 PM) CC: F/U, Hypertension Management, Depression, Back Pain Is Patient Diabetic? No Pain Assessment Patient in pain? yes     Location: lower back Intensity: 6   Primary Care Provider:  Doree Albee MD  CC:  F/U, Hypertension Management, Depression, and Back Pain.  History of Present Illness: Hip Replacment.: Stable since last clinic visit. Pain and ROM stabel per pt. No fevers in setting of hx/o R hip septic arthritis. Now on doxycycline chronically per pt for longstanding infectious ppx.   Back Pain History:      The patient's back pain has been present for > 6 weeks.  On a scale of 1-10, he describes the pain as a 6.        Description of injury in patient's own words:  Pt has had chronic pain for several years. Falther has history of bulging discs per pt. Feels that there may be "something wrong" with back/causing back pain. However, pt does not want imaging or furhter workup as he feels that back surgery would be more detrimental given recent complications of Hip surgery. Pain currently well controlled with fentanyl and percocet. .    Depression History:      Positive alarm features for depression include insomnia.  However, he denies significant weight loss and significant weight gain.        The patient denies that he feels like life is not worth living, denies that he wishes that he were dead, and denies that he has thought about ending his life.        Comments:  Pt does report recurrent insomnia; feels that thoughts throughout the day and overall stress keep him from going to sleep. has tried seroquel, amitryptyline, and  trazodone in the past with minimal improvement  in sleep. .  Depression Treatment History:  Prior Medication Used:   Start Date: Assessment of Effect:   Comments:  Celexa (citalopram)     11/18/2007   some improvement     Still having issues with sleep  Hypertension History:      He complains of headache, but denies chest pain, palpitations, dyspnea with exertion, orthopnea, PND, peripheral edema, and visual symptoms.        Positive major cardiovascular risk factors include male age 23 years old or older, hyperlipidemia, hypertension, and current tobacco user.        Positive history for target organ damage include prior stroke (or TIA).       Allergies: 1)  ! Pcn  Physical Exam  General:  alert and well-developed.   Head:  normocephalic and atraumatic.   Mouth:  good dentition.   Neck:  supple, full ROM  Lungs:  CTAB, no wheezes, rales, rhocii Heart:  RRR, no rubs, gallops, murmurs Abdomen:  S/NT/ND/+bowel sounds  Msk:  No significant joint tenderness in hips Minimal tenderness to palpation in lower back bilaterally  Psych:  Oriented X3, good eye contact, and not depressed appearing.     Impression & Recommendations:  Problem # 1:  BACK PAIN, CHRONIC (ICD-724.5) Pt noted to have herniated disc in  the past. Pain currently well controlled on current regimen of percocet and fentanyl, which has been lonstanding. Braoched issue of furhter imaging to assess back pain status; which pt currently denies. Wil readdress issue at followup visit. Also discussed possible physical therapy for back pain in the future. Pt agreeable to plan.  His updated medication list for this problem includes:    Fentanyl 25 Mcg/hr Pt72 (Fentanyl) .[name]... Apply 1 patch to skin as directed q 72 hrs    Percocet 10-650 Mg Tabs (Oxycodone-acetaminophen) .[name]... Take 1 tablet by mouth qid prn    Aspirin 325 Mg Tabs (Aspirin) .[name]... Take 1 tab by mouth every day  Problem # 2:  ESSENTIAL HYPERTENSION (ICD-401.9) Will likely need alteration in medication  regimen. Would like to start on lisinopril ,however will hold on starting given hx/o renal insufficiency. May consdier starting norvasc instead. Pendig labs.  His updated medication list for this problem includes:    Metoprolol Tartrate 100 Mg Tabs (Metoprolol tartrate) ..... One tab by mouth bid    Hydrochlorothiazide 25 Mg Tabs (Hydrochlorothiazide) .[name]... Take one by mouth daily    Doxazosin Mesylate 2 Mg Tabs (Doxazosin mesylate) .[name]... Take one po daily  Orders: [facility] (04540-98119)  Problem # 3:  INSOMNIA (ICD-780.52) Plan to increase neurontin for concominant pain management and to help in sleep. Pt may benefit from psychotherapy in the future given multiple social stressors. Will plan to refer to mood d/o clinic in the future.   Problem # 4:  DEPRESSIVE DISORDER NOT ELSEWHERE CLASSIFIED (ICD-311) Will continue on celexa and consider psychotherapy vs. psychiatry referral in the future.   Problem # 5:  HIP REPLACEMENT, BILATERAL, HX OF (DEPUY) (ICD-V43.64) Pt now on chronic doxycycline for spetic arthritis ppx s/p Depuy hip replacement.. Will continue to follow. Adressed infectious red flags.   Problem # 6:  HEADACHE (ICD-784.0) Longstanding issue. Has been using BC powders intermittently. Pt instructed to hold on BC powder use aovid GI and Renal side complications. Wil check BMET to assess renal and electrolyte function. May be a component of rebound headache given narcotic and NSAID use. Will reassess at next clinic visit.  His updated medication list for this problem includes:    Fentanyl 25 Mcg/hr Pt72 (Fentanyl) .[name]... Apply 1 patch to skin as directed q 72 hrs    Percocet 10-650 Mg Tabs (Oxycodone-acetaminophen) .[name]... Take 1 tablet by mouth qid prn    Metoprolol Tartrate 100 Mg Tabs (Metoprolol tartrate) ..... One tab by mouth bid    Aspirin 325 Mg Tabs (Aspirin) .[name]... Take 1 tab by mouth every day  Complete Medication List: 1)  Fentanyl 25 Mcg/hr Pt72 (Fentanyl) .... Apply  1 patch to skin as directed q 72 hrs 2)  Percocet 10-650 Mg Tabs (Oxycodone-acetaminophen) .... Take 1 tablet by mouth qid prn 3)  Metoprolol Tartrate 100 Mg Tabs (Metoprolol tartrate) .... One tab by mouth bid 4)  Hydrochlorothiazide 25 Mg Tabs (Hydrochlorothiazide) .... Take one by mouth daily 5)  Eql Fish Oil 1000 Mg Caps (Omega-3 fatty acids) .... 2 tabs twice daily 6)  Aspirin 325 Mg Tabs (Aspirin) .... Take 1 tab by mouth every day 7)  Testosterone Cypionate 100 Mg/ml Oil (Testosterone cypionate) .... 200 mg im every 2 weeks 1 vial (qs one month) 8)  Protonix 40 Mg Pack (Pantoprazole sodium) .... Take one by mouth daily 9)  Neurontin 300 Mg Caps (Gabapentin) .... 2 tabs twice daily 10)  Doxazosin Mesylate 2 Mg Tabs (Doxazosin mesylate) .... Take one  po daily 11)  Potassium Chloride Cr 10 Meq Cr-tabs (Potassium chloride) .... 20 meq daily please for hypokalemia 12)  Omeprazole 40 Mg Cpdr (Omeprazole) .... One daily for reflux 13)  Chantix Starting Month Pak 0.5 Mg X 11 & 1 Mg X 42 Tabs (Varenicline tartrate) .... One daily for 3 days then one two times a day as instructed.  set quit date 1 wk after start 14)  Chantix Continuing Month Pak 1 Mg Tabs (Varenicline tartrate) .... Use as directed  Hypertension Assessment/Plan:      The patient's hypertensive risk group is category C: Target organ damage and/or diabetes.  His calculated 10 year risk of coronary heart disease is 9 %.  Today's blood pressure is 146/95.  His blood pressure goal is < 140/90.  Patient Instructions: 1)  It was good to see you again 2)  I will increase your neuronitn for yor pain and sleep 3)  I will refill you testosterone 4)  I will get some baseline labs to look at your kidney function 5)  Stop taking BC powders for your headache as this can worsen your heartburn and kidney function 6)  Come back to see me in 2 weeks to talk about your mood. 7)  Otherwise call with any questions 8)  God Bless,  9)  Doree Albee  MD  Prescriptions: NEURONTIN 300 MG CAPS (GABAPENTIN) 2 tabs twice daily  #120 x 3   Entered and Authorized by:   Doree Albee MD   Signed by:   Doree Albee MD on 08/19/2010   Method used:   Print then Give to Patient   RxID:   [id number] TESTOSTERONE CYPIONATE 100 MG/ML  OIL (TESTOSTERONE CYPIONATE) 200 mg IM every 2 weeks 1 vial (QS one month)  #1 x 3   Entered and Authorized by:   Doree Albee MD   Signed by:   Doree Albee MD on 08/19/2010   Method used:   Print then Give to Patient   RxID:   [id number]    Orders Added: 1)  Basic [facility] [10272-53664]     Prevention & Chronic Care Immunizations   Influenza vaccine: Fluvax MCR  (07/07/2010)   Influenza vaccine due: 07/03/2009    Tetanus booster: 05/05/2004: Done.   Tetanus booster due: 05/05/2014    Pneumococcal vaccine: Not documented  Other Screening   Smoking status: current  (06/23/2010)   Smoking cessation counseling: yes  (05/01/2010)   Target quit date: 05/17/2010  (05/01/2010)  Lipids   Total Cholesterol: 152  (07/19/2009)   LDL: 81  (07/19/2009)   LDL Direct: Not documented   HDL: 41  (07/19/2009)   Triglycerides: 148  (07/19/2009)    SGOT (AST): 17  (04/02/2010)   SGPT (ALT): 19  (04/02/2010)   Alkaline phosphatase: 69  (04/02/2010)   Total bilirubin: 0.5  (04/02/2010)    Lipid flowsheet reviewed?: Yes   Progress toward LDL goal: At goal  Hypertension   Last Blood Pressure: 146 / 95  (08/19/2010)   Serum creatinine: 1.17  (06/23/2010)   BMP action: Ordered   Serum potassium 4.1  (06/23/2010)    Hypertension flowsheet reviewed?: Yes   Progress toward BP goal: Deteriorated  Self-Management Support :   Personal Goals (by the next clinic visit) :      Personal blood pressure goal: 140/90  (07/19/2009)     Personal LDL goal: 100  (07/19/2009)    Hypertension self-management support: Written self-care plan  (06/23/2010)    Hypertension self-management support  not done  because: Good outcomes  (11/06/2009)    Lipid self-management support: Written self-care plan  (06/23/2010)     Lipid self-management support not done because: Good outcomes  (11/06/2009)  Appended Document: Followup on Multiple Medical Problems    Clinical Lists Changes  Orders: Added new Test order of [facility]- [facility]  Level 4 (16109) - Signed

## 2010-11-04 NOTE — Assessment & Plan Note (Signed)
Summary: TESTOSTERONE INJECTION [facility]  Nurse Visit   Allergies: 1)  ! Pcn  Medication Administration  Injection # 1:    Medication: testosterone Cypionat 100 mg    Diagnosis: TESTOSTERONE DEFICIENCY (ICD-257.2)    Route: IM    Site: RUOQ gluteus    Exp Date: 04/2011    Lot #: [id number]    Mfr: Sandoz    Comments: testosterone 200 mg given IM ( 100 mg/ml  , 2 cc given ) no charge for medication, patient brings in his own medication    Patient tolerated injection without complications    Given by: Theresia Lo RN (December 16, 2009 3:51 PM)  Orders Added: 1)  Admin of Injection (IM/SQ) [[id number]]   Medication Administration  Injection # 1:    Medication: testosterone Cypionat 100 mg    Diagnosis: TESTOSTERONE DEFICIENCY (ICD-257.2)    Route: IM    Site: RUOQ gluteus    Exp Date: 04/2011    Lot #: [id number]    Mfr: Sandoz    Comments: testosterone 200 mg given IM ( 100 mg/ml  , 2 cc given ) no charge for medication, patient brings in his own medication    Patient tolerated injection without complications    Given by: Theresia Lo RN (December 16, 2009 3:51 PM)  Orders Added: 1)  Admin of Injection (IM/SQ) [[id number]]

## 2010-11-04 NOTE — Assessment & Plan Note (Signed)
Summary: injection/eo  Nurse Visit   Allergies: 1)  ! Pcn  Medication Administration  Injection # 1:    Medication: Testosterone Cypionat 200mg  ing    Diagnosis: TESTOSTERONE DEFICIENCY (ICD-257.2)    Route: IM    Site: RUOQ gluteus    Exp Date: 07/2012    Lot #: [id number]    Mfr: sandoz    Comments: no charge for medication, patient brings his own medication. Testosterone Cypionate 100 mg per ml  ( 2 ml given.)    Given by: Theresia Lo RN (March 10, 2010 4:12 PM)  Orders Added: 1)  Testosterone Cypionat 200mg  ing [J1080]   Medication Administration  Injection # 1:    Medication: Testosterone Cypionat 200mg  ing    Diagnosis: TESTOSTERONE DEFICIENCY (ICD-257.2)    Route: IM    Site: RUOQ gluteus    Exp Date: 07/2012    Lot #: [id number]    Mfr: sandoz    Comments: no charge for medication, patient brings his own medication. Testosterone Cypionate 100 mg per ml  ( 2 ml given.)    Given by: Theresia Lo RN (March 10, 2010 4:12 PM)  Orders Added: 1)  Testosterone Cypionat 200mg  ing [J1080]  Appended Document: injection/eo

## 2010-11-04 NOTE — Miscellaneous (Signed)
Summary: Refills  Prescriptions: PERCOCET 10-650 MG TABS (OXYCODONE-ACETAMINOPHEN) Take 1 tablet by mouth QID PRN  #100 x 0   Entered by:   Doree Albee MD   Authorized by:   Regino Schultze Nurse FPC   Signed by:   Doree Albee MD on 05/01/2010   Method used:   Print then Give to Patient   RxID:   [id number] FENTANYL 25 MCG/HR PT72 (FENTANYL) Apply 1 patch to skin as directed q 72 hrs  #10 x 0   Entered by:   Doree Albee MD   Authorized by:   Regino Schultze Nurse FPC   Signed by:   Doree Albee MD on 05/01/2010   Method used:   Print then Give to Patient   RxID:   [id number] FENTANYL 25 MCG/HR PT72 (FENTANYL) Apply 1 patch to skin as directed q 72 hrs  #10 x 0   Entered by:   Doree Albee MD   Authorized by:   Regino Schultze Nurse FPC   Signed by:   Doree Albee MD on 05/01/2010   Method used:   Print then Give to Patient   RxID:   [id number] PERCOCET 10-650 MG TABS (OXYCODONE-ACETAMINOPHEN) Take 1 tablet by mouth QID PRN  #100 x 0   Entered by:   Doree Albee MD   Authorized by:   Regino Schultze Nurse FPC   Signed by:   Doree Albee MD on 05/01/2010   Method used:   Print then Give to Patient   RxID:   [id number]  Patient is getting ready to have surgery for hip replacement and he needs a refill of Percocet and Fentanyl before he is admitted. Jeffrey [name]  May 01, 2010 3:12 PM    Patient print out of Rxs appears double above due to printer paper not loaded correctly.      Medication Administration  Medication # 1:    Medication: Albuterol Sulfate Sol 1mg  unit dose    Diagnosis: COUGH (ICD-786.2)    Dose: 2.5ng/63ml    Route: inhaled    Exp Date: 11/06/2011    Lot #: [id number]    Mfr: nephron    Patient tolerated medication without complications    Given by: Jimmy Footman, CMA (May 01, 2010 4:02 PM)  Orders Added: 1)  Albuterol Sulfate Sol 1mg  unit dose [O3500]

## 2010-11-04 NOTE — Assessment & Plan Note (Signed)
Summary: SHOT/KH  Nurse Visit   Allergies: 1)  ! Pcn  Medication Administration  Injection # 1:    Medication: Testosterone Cypionat 200mg  ing    Diagnosis: TESTOSTERONE DEFICIENCY (ICD-257.2)    Route: IM    Site: RUOQ gluteus    Exp Date: 09/2012    Lot #: [id number]    Mfr: sandoz    Comments: testosterone cypionate 200 mg given.  (100 mg per ml , 2 ml given ) no charge for medication, patient brings his on medciation    Patient tolerated injection without complications    Given by: Theresia Lo RN (June 20, 2010 11:05 AM)  Orders Added: 1)  Admin of Injection (IM/SQ) [[id number]]   Medication Administration  Injection # 1:    Medication: Testosterone Cypionat 200mg  ing    Diagnosis: TESTOSTERONE DEFICIENCY (ICD-257.2)    Route: IM    Site: RUOQ gluteus    Exp Date: 09/2012    Lot #: [id number]    Mfr: sandoz    Comments: testosterone cypionate 200 mg given.  (100 mg per ml , 2 ml given ) no charge for medication, patient brings his on medciation    Patient tolerated injection without complications    Given by: Theresia Lo RN (June 20, 2010 11:05 AM)  Orders Added: 1)  Admin of Injection (IM/SQ) [phone]

## 2010-11-04 NOTE — Progress Notes (Signed)
  Phone Note Other Incoming   Caller: [facility] Details for Reason: Need NPI number Summary of Call: Pt at pharmacy with a paper script (white) for fentayl patches, they need the NPI number, I dont have Dr. [place] Blas NPI. Also his note states pt needs to come in for futher pain meds. He also received Percocet recently. Told pharmacy to call Monday Initial call taken by: Milinda Antis MD,  June 06, 2010 5:25 PM Call placed by: Milinda Antis MD,  June 06, 2010 5:24 PM Details for Reason: [facility]

## 2010-11-04 NOTE — Assessment & Plan Note (Signed)
Summary: SHOT/KH  Nurse Visit   Vital Signs:  Patient profile:   50 year old male Temp:     98.3 degrees F  Vitals Entered By: Theresia Lo RN (July 07, 2010 3:43 PM)  Allergies: 1)  ! Pcn  Immunizations Administered:  Influenza Vaccine # 1:    Vaccine Type: Fluvax MCR    Site: right deltoid    Mfr: GlaxoSmithKline    Dose: 0.5 ml    Route: IM    Given by: Theresia Lo RN    Exp. Date: 04/01/2011    Lot #: [id number]    VIS given: 04/29/10 version given July 07, 2010.  Flu Vaccine Consent Questions:    Do you have a history of severe allergic reactions to this vaccine? no    Any prior history of allergic reactions to egg and/or gelatin? no    Do you have a sensitivity to the preservative Thimersol? no    Do you have a past history of Guillan-Barre Syndrome? no    Do you currently have an acute febrile illness? no    Have you ever had a severe reaction to latex? no    Vaccine information given and explained to patient? yes  Medication Administration  Injection # 1:    Medication: Testosterone Cypionat 200mg  ing    Diagnosis: TESTOSTERONE DEFICIENCY (ICD-257.2)    Route: IM    Site: RUOQ gluteus    Exp Date: 09/2012    Lot #: [id number]    Mfr: Sandoz    Comments: 100 mg / ml , 2 ml given for total of 200 mg IM. No charge for medication, patient brings his own medication    Patient tolerated injection without complications    Given by: Theresia Lo RN (July 07, 2010 3:41 PM)  Orders Added: 1)  Influenza Vaccine MCR [00025] 2)  Administration Flu vaccine - MCR [G0008] 3)  Admin of Injection (IM/SQ) [14782]     Medication Administration  Injection # 1:    Medication: Testosterone Cypionat 200mg  ing    Diagnosis: TESTOSTERONE DEFICIENCY (ICD-257.2)    Route: IM    Site: RUOQ gluteus    Exp Date: 09/2012    Lot #: [id number]    Mfr: Sandoz    Comments: 100 mg / ml , 2 ml given for total of 200 mg IM. No charge for medication, patient brings his own  medication    Patient tolerated injection without complications    Given by: Theresia Lo RN (July 07, 2010 3:41 PM)  Orders Added: 1)  Influenza Vaccine MCR [00025] 2)  Administration Flu vaccine - MCR [G0008] 3)  Admin of Injection (IM/SQ) [08657]

## 2010-11-04 NOTE — Miscellaneous (Signed)
Summary: pt cancelled sleep study  Clinical Lists Changes he cancelled due to insurance.Golden Circle RN  July 22, 2010 2:46 PM

## 2010-11-04 NOTE — Miscellaneous (Signed)
Summary: Percocet Rx   Clinical Lists Changes  Medications: Rx of PERCOCET 10-650 MG TABS (OXYCODONE-ACETAMINOPHEN) Take 1 tablet by mouth QID PRN;  #100 x 0;  Signed;  Entered by: Doree Albee MD;  Authorized by: Doree Albee MD;  Method used: Historical  ------------------------------------------------------------------------------------------------------------------------------------------------------------ HAND WRITTEN RX GIVEN TO FRONT STAFF Doree Albee MD June 04, 2010 1:51 PM   Prescriptions: PERCOCET 10-650 MG TABS (OXYCODONE-ACETAMINOPHEN) Take 1 tablet by mouth QID PRN  #100 x 0   Entered and Authorized by:   Doree Albee MD   Signed by:   Doree Albee MD on 06/04/2010   Method used:   Historical   RxID:   [id number]

## 2010-11-04 NOTE — Miscellaneous (Signed)
Summary: testosterone inj approval.  Clinical Lists Changes     Spoke with [employer] and then [place] medicaid and approved testosterone injections. they say they will call patient and inform.  Eustaquio Boyden  MD  November 29, 2009 12:28 PM

## 2010-11-04 NOTE — Assessment & Plan Note (Signed)
Summary: F/U PER DR Gevon Markus/DSL   Vital Signs:  Patient profile:   50 year old male Height:      71 inches Weight:      228 pounds BMI:     31.91 Temp:     99.0 degrees F oral Pulse rate:   81 / minute BP sitting:   142 / 90  (left arm) Cuff size:   regular  Vitals Entered By: Tessie Fass CMA (November 05, 2009 2:53 PM) CC: F/U Is Patient Diabetic? No Pain Assessment Patient in pain? yes     Location: left ankle Intensity: 7   Primary Care Provider:  Eustaquio Boyden  MD  CC:  F/U.  History of Present Illness: CC: f/u issues  50yo M with h/o hypotestosteronism s/p biweekly testosterone IM shots who was admitted 07/22/2009 with ARF to Cr 4.6 presumed 2/2 obstructive from BPH.  Hospital labs/imaging: renal US WNL (2 nonobstructive stones), SPEP UPEP normal, ANA WNL, ASO titer normal, ESR elevated at 71, After starting cardura, IVF, and holding gabapentin, lisinopril, HCTZ, Cr slowly trended downward to 2.6 at discharge 07/25/2009.  Since home, feeling better, voiding normally with cardura 2mg  on board.  Saw urologist and told ok to restart testosterone.  Last Cr 07/2009 1.89.  1. HTN - today 140/92.  On metoprolol 100 BID, cardura 2mg  and HCTZ 12.5.  No HA, chest pain.  2. hypotestosterone - last testosterone injection (200mg  q2wks) prior to hospitalization.  Testosterone while in hospital was 440.  After admission to hospital, testosterone has been held.  States prior to testosterone injections (started around 2008), pt felt fatigued/drained, decreased libido.  Has family history of hypotestosteronism.  Tried testosterone gel previously but didn't note any improvmeent in symptoms.  Doesn't note change between first and second weeks after injection.  Plan was to f/u with urology in 02/2010 with KUB.  3. BPH - presumed 2/2 sxs and exam.  Started cardura and has noted significant improvement in voiding.  Dr. Annabell Howells is urologist.  Voiding normally now.  4. chronic left leg pain -  started about 3 yrs ago after fracture of that ankle.  characterizes pain as burning sensation only around left ankle.  had improved on gabapentin, but with ARF held.  taking now 100mg  at bedtime.  States pain has worsened since he had MRI of hips last week.  Now cannot get out and walk.    5. chronic back pain on disability from herniated disc, B AVN of hips s/p replacement with dupuy - fentanyl/percocet.  undergoing w/u to see if depuy faulty.  6. smoking - now down to 5 cig/day.  very interested in chantix as this worked well previously.  Habits & Providers  Alcohol-Tobacco-Diet     Tobacco Status: current     Cigarette Packs/Day: <0.25     Cardiologist: Jacinto Halim     Neurologist: [facility]     [facility]ist: [facility]     Urologist: Annabell Howells  Current Medications (verified): 1)  Fentanyl 25 Mcg/hr Pt72 (Fentanyl) .... Apply 1 Patch To Skin As Directed Q 72 Hrs 2)  Percocet 10-650 Mg Tabs (Oxycodone-Acetaminophen) .... Take 1 Tablet By Mouth Qid Prn 3)  Metoprolol Tartrate 100 Mg Tabs (Metoprolol Tartrate) .... One Tab By Mouth Bid 4)  Hydrochlorothiazide 12.5 Mg Caps (Hydrochlorothiazide) .... Take One Daily 5)  Celexa 40 Mg Tabs (Citalopram Hydrobromide) .... Take One Tablet Daily By Mouth 6)  Eql Fish Oil 1000 Mg  Caps (Omega-3 Fatty Acids) .... 2 Tabs Twice Daily  7)  Aspirin 325 Mg Tabs (Aspirin) .... Take 1 Tab By Mouth Every Day 8)  Testosterone Cypionate 100 Mg/ml  Oil (Testosterone Cypionate) .... 200 Mg Im Every 2 Weeks 1 Vial (Qs One Month) 9)  Protonix 40 Mg Pack (Pantoprazole Sodium) .... Take One By Mouth Daily 10)  Neurontin 300 Mg Caps (Gabapentin) .... Take One By Mouth At Bedtime 11)  Doxazosin Mesylate 2 Mg Tabs (Doxazosin Mesylate) .... Take One Po Daily  Allergies (verified): 1)  ! Pcn  Past History:  Past medical, surgical, family and social histories (including risk factors) reviewed for relevance to current acute and chronic problems.  Past Medical  History: Reviewed history from 08/01/2009 and no changes required. 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  was on coumadin, now on ASA per Dr. Pearlean Brownie.  Had bubble TEE study which found hole in hear, no records.  IVC filter in place (Left parietal infarct due to PFO-6/04) h/o L post malleolar fracture 2004 Depression ASD Testosterone deficiency Tobacco abuse HTN  Past Surgical History: 2-D Echo-LVEF 55-65% - 03/06/2003, MRI Brain-acute cortical infarct L. parietal lobe, embolic appearing - 03/06/2003 MRI-Lumbar Spine-sign degeneration, 3 level disc protrusions, canal stenosis - 03/05/2005 TEE-right to left shunt suggestive of PFO or ASD. - 03/06/2003 IVC Filter placement - 12/03/2005 R hip replacement 3/08 L Hip Replacement - 12/03/2005 Renal US 07/2009 - no hydro, probable nonobstructive L renal calculus (07/23/2009) - B12 300 (sl low), RBC folate 258 (WNL)  Family History: Reviewed history from 08/01/2009 and no changes required. Father with severe DDD with back fusion and hypotestosteronism on shots Several family members with DM.  Social History: Reviewed history from 08/01/2009 and no changes required. Currently renting house, smokes only outside, down to 3/4 ppd.  recent divorce from wife Bibi.  His sister has pancreatic cancer and at times living with him.  Is now on disability secondary to DDD.  No kids.  Has financial difficulty.  Smoker. Packs/Day:  <0.25  Physical Exam  General:  alert, well-developed, well-nourished, well-hydrated, normal appearance, and cooperative to examination.   Lungs:  normal respiratory effort, no intercostal retractions, no accessory muscle use, normal breath sounds, no crackles, and no wheezes.   Heart:  normal rate, no gallop, and no rub.   Msk:  left ankle more swollen than right, not erythematous.  worst pain with  inversion at ankle.  no point tenderness.   Pulses:  2+ periph pulses Extremities:  no edema, warm   Impression & Recommendations:  Problem # 1:  ESSENTIAL HYPERTENSION (ICD-401.9) Assessment Unchanged recheck Cr today.  if normal, will have pt come back in 2 wks.  if bp still elevated, will consider starting lisinopril for renoprotective effects.  His updated medication list for this problem includes:    Metoprolol Tartrate 100 Mg Tabs (Metoprolol tartrate) ..... One tab by mouth bid    Hydrochlorothiazide 12.5 Mg Caps (Hydrochlorothiazide) .[name]... Take one daily    Doxazosin Mesylate 2 Mg Tabs (Doxazosin mesylate) .[name]... Take one po daily  Orders: Basic Met-[facility] ([address]-60454) [facility]- Est  Level 4 (99214)  BP today: 142/90 Prior BP: 150/90 (08/01/2009)  Labs Reviewed: K+: 3.9 (08/01/2009) Creat: : 1.89 (08/01/2009)   Chol: 152 (07/19/2009)   HDL: 41 (07/19/2009)   LDL: 81 (07/19/2009)   TG: 148 (07/19/2009)  Problem # 2:  TESTOSTERONE DEFICIENCY (ICD-257.2) check testosterone  level.  off testosterone since 07/2009.  pt wants to restart.  as long as Cr under control, may restart.  will need to closely monitor PSA and prostate.  will research best testosterone vehicle. Orders: Testosterone-[facility] ([id number]) [facility]- Est  Level 4 (56213)  Problem # 3:  RENAL FAILURE, ACUTE (ICD-584.9) resolving nicely.  checked today. Labs Reviewed: BUN: 21 (08/01/2009)   Cr: 1.89 (08/01/2009)    Hgb: 16.5 (07/06/2008)   Hct: 50.4 (07/06/2008)   Ca++: 10.0 (08/01/2009)    TP: 7.4 (08/01/2009)   Alb: 4.5 (08/01/2009)  Problem # 4:  TOBACCO ABUSE (ICD-305.1)  Orders: [facility]- Est  Level 4 (08657)  Encouraged smoking cessation and discussed different methods for smoking cessation.  holding off on chantix for now.  Problem # 5:  Hx of CLOSED FRACTURE OF LATERAL MALLEOLUS (ICD-824.2) increase gabapentin to 300mg  at bedtime.  next visit may increase to 300mg  two times a day.  check Cr next visit.   (07/23/2009) - B12 300 (sl low), RBC folate 258 (WNL) Orders: [facility]- Est  Level 4 (99214)  Problem # 6:  Hx of BENIGN PROSTATIC HYPERTROPHY, WITH OBSTRUCTION (ICD-600.01) check psa routinely.  (biyearly?) Orders: Testosterone-[facility] ([id number]) Basic Met-[facility] [id number])  Complete Medication List: 1)  Fentanyl 25 Mcg/hr Pt72 (Fentanyl) .... Apply 1 patch to skin as directed q 72 hrs 2)  Percocet 10-650 Mg Tabs (Oxycodone-acetaminophen) .... Take 1 tablet by mouth qid prn 3)  Metoprolol Tartrate 100 Mg Tabs (Metoprolol tartrate) .... One tab by mouth bid 4)  Hydrochlorothiazide 12.5 Mg Caps (Hydrochlorothiazide) .... Take one daily 5)  Celexa 40 Mg Tabs (Citalopram hydrobromide) .... Take one tablet daily by mouth 6)  Eql Fish Oil 1000 Mg Caps (Omega-3 fatty acids) .... 2 tabs twice daily 7)  Aspirin 325 Mg Tabs (Aspirin) .... Take 1 tab by mouth every day 8)  Testosterone Cypionate 100 Mg/ml Oil (Testosterone cypionate) .... 200 mg im every 2 weeks 1 vial (qs one month) 9)  Protonix 40 Mg Pack (Pantoprazole sodium) .... Take one by mouth daily 10)  Neurontin 300 Mg Caps (Gabapentin) .... Take one by mouth at bedtime 11)  Doxazosin Mesylate 2 Mg Tabs (Doxazosin mesylate) .... Take one po daily  Patient Instructions: 1)  Please return in 2-3 weeks for f/u. 2)  Blood work today (testosterone level and kidney function). 3)  Increase Gabapentin to 300mg  nightly. 4)  Continue HCTZ 12.5mg  daily.  Don't start Lisinopril yet - we will see how your blood pressure is doing when you come back.  Also try to check blood pressure at home and keep a log so we know how you're doing. 5)  We will discuss smoking cessation at your next visit and may start Chantix in the near future. 6)  Call clinic with questions.  I've refilled all your meds. Prescriptions: DOXAZOSIN MESYLATE 2 MG TABS (DOXAZOSIN MESYLATE) take one po daily  #30 x 3   Entered and Authorized by:   Eustaquio Boyden  MD   Signed by:    Eustaquio Boyden  MD on 11/05/2009   Method used:   Electronically to        [facility] [address] [phone]* (retail)       [address]       [place], [place]  [address]       Ph: [phone] or [phone]       Fax: [phone]   RxID:   [id number] PROTONIX 40 MG  PACK (PANTOPRAZOLE SODIUM) take one by mouth daily  #32 x 3   Entered and Authorized by:   Eustaquio Boyden  MD   Signed by:   Eustaquio Boyden  MD on 11/05/2009   Method used:   Electronically to        [facility] [address] [phone]* (retail)       [address]       [place], [place]  [address]       Ph: [phone] or [phone]       Fax: [phone]   RxID:   [id number] METOPROLOL TARTRATE 100 MG TABS (METOPROLOL TARTRATE) One tab by mouth BID  #60 x 3   Entered and Authorized by:   Eustaquio Boyden  MD   Signed by:   Eustaquio Boyden  MD on 11/05/2009   Method used:   Electronically to        [facility] [address] [phone]* (retail)       [address]       [place], [place]  [address]       Ph: [phone] or [phone]       Fax: [phone]   RxID:   [id number] PERCOCET 10-650 MG TABS (OXYCODONE-ACETAMINOPHEN) Take 1 tablet by mouth QID PRN  #100 x 0   Entered and Authorized by:   Eustaquio Boyden  MD   Signed by:   Eustaquio Boyden  MD on 11/05/2009   Method used:   Print then Give to Patient   RxID:   [id number] FENTANYL 25 MCG/HR PT72 (FENTANYL) Apply 1 patch to skin as directed q 72 hrs  #10 x 0   Entered and Authorized by:   Eustaquio Boyden  MD   Signed by:   Eustaquio Boyden  MD on 11/05/2009   Method used:   Print then Give to Patient   RxID:   [id number] NEURONTIN 300 MG CAPS (GABAPENTIN) take one by mouth at bedtime  #30 x 1   Entered and Authorized by:   Eustaquio Boyden  MD   Signed by:   Eustaquio Boyden  MD on 11/05/2009   Method used:   Electronically to        [facility] [address] [phone]*  (retail)       [address]       [place], [place]  [address]       Ph: [phone] or [phone]       Fax: [phone]   RxID:   [id number]   Appended Document: F/U PER DR Tahji Goessel/DSL    Clinical Lists Changes  Observations: Added new observation of HTNSMSUPPACT: Good outcomes (11/06/2009 8:44) Added new observation of LIPSMSUPPACT: Good outcomes (11/06/2009 8:44) Added new observation of HTN PROGRESS: Improved (11/06/2009 8:44) Added new observation of HTN FSREVIEW: Yes (11/06/2009 8:44) Added new observation of LIPID PROGRS: At goal (11/06/2009 8:44) Added new observation of LIPID FSREVW: Yes (11/06/2009 8:44) Added new observation of DM PROGRESS: N/A (11/06/2009 8:44) Added new observation of DM FSREVIEW: N/A (11/06/2009 8:44)       Prevention & Chronic Care Immunizations   Influenza vaccine: Fluvax MCR  (07/19/2009)   Influenza vaccine due: 07/03/2009    Tetanus booster: 05/05/2004: Done.   Tetanus booster due: 05/05/2014    Pneumococcal vaccine: Not documented  Other Screening   Smoking status: current  (11/05/2009)   Smoking cessation  counseling: yes  (08/29/2008)  Lipids   Total Cholesterol: 152  (07/19/2009)   LDL: 81  (07/19/2009)   LDL Direct: Not documented   HDL: 41  (07/19/2009)   Triglycerides: 148  (07/19/2009)    SGOT (AST): 16  (08/01/2009)   SGPT (ALT): 14  (08/01/2009)   Alkaline phosphatase: 62  (08/01/2009)   Total bilirubin: 0.3  (08/01/2009)    Lipid flowsheet reviewed?: Yes   Progress toward LDL goal: At goal  Hypertension   Last Blood Pressure: 142 / 90  (11/05/2009)   Serum creatinine: 1.89  (08/01/2009)   Serum potassium 3.9  (08/01/2009)    Hypertension flowsheet reviewed?: Yes   Progress toward BP goal: Improved  Self-Management Support :   Personal Goals (by the next clinic visit) :      Personal blood pressure goal: 140/90  (07/19/2009)     Personal LDL goal: 100  (07/19/2009)     Hypertension self-management support: Not documented    Hypertension self-management support not done because: Good outcomes  (11/06/2009)    Lipid self-management support: Not documented     Lipid self-management support not done because: Good outcomes  (11/06/2009)

## 2010-11-04 NOTE — Assessment & Plan Note (Signed)
Summary: INJECTION/EO  Nurse Visit   Allergies: 1)  ! Pcn  Medication Administration  Injection # 1:    Medication: Testosterone Cypionat 200mg  ing    Diagnosis: TESTOSTERONE DEFICIENCY (ICD-257.2)    Route: IM    Site: LUOQ gluteus    Exp Date: 04/2011    Lot #: [id number]    Mfr: sandoz    Comments: no charge for medication, patient brings in medication.    testosterone 100 mg per ml, 2 ml given  IM LUOQ.    Patient tolerated injection without complications    Given by: Theresia Lo RN (January 27, 2010 4:22 PM)  Orders Added: 1)  Admin of Injection (IM/SQ) [81191] Prescriptions: PERCOCET 10-650 MG TABS (OXYCODONE-ACETAMINOPHEN) Take 1 tablet by mouth QID PRN  #100 x 0   Entered and Authorized by:   Eustaquio Boyden  MD   Signed by:   Eustaquio Boyden  MD on 01/27/2010   Method used:   Print then Give to Patient   RxID:   [id number] FENTANYL 25 MCG/HR PT72 (FENTANYL) Apply 1 patch to skin as directed q 72 hrs  #10 x 0   Entered and Authorized by:   Eustaquio Boyden  MD   Signed by:   Eustaquio Boyden  MD on 01/27/2010   Method used:   Print then Give to Patient   RxID:   [id number]  filled monthly pain meds. Eustaquio Boyden  MD  January 27, 2010 1:40 PM  Medication Administration  Injection # 1:    Medication: Testosterone Cypionat 200mg  ing    Diagnosis: TESTOSTERONE DEFICIENCY (ICD-257.2)    Route: IM    Site: LUOQ gluteus    Exp Date: 04/2011    Lot #: [id number]    Mfr: sandoz    Comments: no charge for medication, patient brings in medication.    testosterone 100 mg per ml, 2 ml given  IM LUOQ.    Patient tolerated injection without complications    Given by: Theresia Lo RN (January 27, 2010 4:22 PM)  Orders Added: 1)  Admin of Injection (IM/SQ) [phone]   Appended Document: INJECTION/EO patient advised to return in one week to have labs drawn for testosterone and B Met .labs  have been entered  in future orders

## 2010-11-04 NOTE — Assessment & Plan Note (Signed)
Summary: INJECTION/EO  Nurse Visit   Allergies: 1)  ! Pcn  Medication Administration  Injection # 1:    Medication: Testosterone Cypionat 200mg  ing    Diagnosis: TESTOSTERONE DEFICIENCY (ICD-257.2)    Route: IM    Site: RUOQ gluteus    Exp Date: 04/2011    Lot #: [id number]    Mfr: sandoz    Comments:  Testosterone Cypionate 100 mg per ml ,  2 ml given  ( 200 mg. ).  patient brings in  medication. no charge for medication    Patient tolerated injection without complications    Given by: Theresia Lo RN (Feb 10, 2010 2:13 PM)  Orders Added: 1)  Admin of Injection (IM/SQ) [phone]   Medication Administration  Injection # 1:    Medication: Testosterone Cypionat 200mg  ing    Diagnosis: TESTOSTERONE DEFICIENCY (ICD-257.2)    Route: IM    Site: RUOQ gluteus    Exp Date: 04/2011    Lot #: [id number]    Mfr: sandoz    Comments:  Testosterone Cypionate 100 mg per ml ,  2 ml given  ( 200 mg. ).  patient brings in  medication. no charge for medication    Patient tolerated injection without complications    Given by: Theresia Lo RN (Feb 10, 2010 2:13 PM)  Orders Added: 1)  Admin of Injection (IM/SQ) [phone]

## 2010-11-04 NOTE — Letter (Signed)
Summary: Pain Contract  Pain Contract   Imported By: Knox Royalty 12/06/2009 09:14:32  _____________________________________________________________________  External Attachment:    Type:   Image     Comment:   External Document

## 2010-11-04 NOTE — Assessment & Plan Note (Signed)
Summary: TESTOSTERONE INJECTION /[facility]  96Nurse Visit  BP checked today using large adult cuff. BP RA 140/90, LA 146/96  pulse 68. will forward to MD. Theresia Lo RN  December 30, 2009 5:10 PM\  Allergies: 1)  ! Pcn  Medication Administration  Injection # 1:    Medication: Testosterone Cypionat 200mg     Diagnosis: TESTOSTERONE DEFICIENCY (ICD-257.2)    Route: IM    Site: LUOQ gluteus    Exp Date: 04/2011    Lot #: [id number]    Mfr: Sandoz    Comments: patient brings in his own medication, no charge for medication.   testosterone 200 mg given  (100 mg per ml, 2 ml given)    Patient tolerated injection without complications    Given by: Theresia Lo RN (December 30, 2009 5:05 PM)  Orders Added: 1)  Admin of Injection (IM/SQ) [95638] Prescriptions: PERCOCET 10-650 MG TABS (OXYCODONE-ACETAMINOPHEN) Take 1 tablet by mouth QID PRN  #100 x 0   Entered and Authorized by:   Eustaquio Boyden  MD   Signed by:   Eustaquio Boyden  MD on 12/31/2009   Method used:   Print then Give to Patient   RxID:   [id number] FENTANYL 25 MCG/HR PT72 (FENTANYL) Apply 1 patch to skin as directed q 72 hrs  #10 x 0   Entered and Authorized by:   Eustaquio Boyden  MD   Signed by:   Eustaquio Boyden  MD on 12/31/2009   Method used:   Print then Give to Patient   RxID:   [id number]    Medication Administration  Injection # 1:    Medication: Testosterone Cypionat 200mg     Diagnosis: TESTOSTERONE DEFICIENCY (ICD-257.2)    Route: IM    Site: LUOQ gluteus    Exp Date: 04/2011    Lot #: [id number]    Mfr: Sandoz    Comments: patient brings in his own medication, no charge for medication.   testosterone 200 mg given  (100 mg per ml, 2 ml given)    Patient tolerated injection without complications    Given by: Theresia Lo RN (December 30, 2009 5:05 PM)  Orders Added: 1)  Admin of Injection (IM/SQ) [[id number]]   patient requests refill on pain medication. will notify Dr. Sharen Hones. Theresia Lo RN   December 30, 2009 5:08 PM  done.  please inform patient ready to pick up. Eustaquio Boyden  MD  December 31, 2009 1:14 PM    patient notified. Theresia Lo RN  December 31, 2009 1:31 PM

## 2010-11-04 NOTE — Progress Notes (Signed)
Summary: update  Phone Note Outgoing Call   Summary of Call: attempting to call to discuss blood work. Cr again bumped up to 2.25 in setting of acute illness.  Would encourage fluid hydration, likely need to recheck next week.  K remains low.  Will want patient to start on prescription K supplementation.  Asked him to call us back. Initial call taken by: Eustaquio Boyden  MD,  January 14, 2010 10:10 AM  Follow-up for Phone Call        PT CALLED BACK.   Additional Follow-up for Phone Call Additional follow up Details #1::        Called and spoke with patient.  Asked him to come in for BMP next week to follow Cr.  Advised to fill scripts that I sent in yesterday to treat presumed COPD exac.  To seek medical care if worsening.  Spoke with pharmacists, K 64mg  is equivalent to 1 mEq so patient likely only taking about 1.81mEq daily, needs higher dose.  Sent in script for K Dur daily.  Called pt and informed. Additional Follow-up by: Eustaquio Boyden  MD,  January 14, 2010 11:08 AM    New/Updated Medications: POTASSIUM CHLORIDE CR 10 MEQ CR-TABS (POTASSIUM CHLORIDE) 20 mEq daily please for hypokalemia Prescriptions: POTASSIUM CHLORIDE CR 10 MEQ CR-TABS (POTASSIUM CHLORIDE) 20 mEq daily please for hypokalemia  #30 x 1   Entered and Authorized by:   Eustaquio Boyden  MD   Signed by:   Eustaquio Boyden  MD on 01/14/2010   Method used:   Electron[facility] to        [facility] [address] [phone]* (retail)       [address]       [place], [place]  [address]       Ph: [phone] or [phone]       Fax: [phone]   RxID:   [id number]

## 2010-11-04 NOTE — Assessment & Plan Note (Signed)
Summary: [facility]  Nurse Visit   Allergies: 1)  ! Pcn  Medication Administration  Injection # 1:    Medication: Testosterone Cypionat 200mg  ing    Diagnosis: TESTOSTERONE DEFICIENCY (ICD-257.2)    Route: IM    Site: LUOQ gluteus    Exp Date: 08/2012    Lot #: [id number]    Mfr: Sandoz    Comments: testosterone 100 mg /ml  2 ml given IM LUOQ. no charge for medication. patient brings in his own medication    Patient tolerated injection without complications    Given by: Theresia Lo RN (April 22, 2010 3:38 PM)  Orders Added: 1)  Admin of Injection (IM/SQ) [phone]   Medication Administration  Injection # 1:    Medication: Testosterone Cypionat 200mg  ing    Diagnosis: TESTOSTERONE DEFICIENCY (ICD-257.2)    Route: IM    Site: LUOQ gluteus    Exp Date: 08/2012    Lot #: [id number]    Mfr: Sandoz    Comments: testosterone 100 mg /ml  2 ml given IM LUOQ. no charge for medication. patient brings in his own medication    Patient tolerated injection without complications    Given by: Theresia Lo RN (April 22, 2010 3:38 PM)  Orders Added: 1)  Admin of Injection (IM/SQ) [phone]

## 2010-11-04 NOTE — Progress Notes (Signed)
Summary: Rx Prob  Phone Note Call from Patient Call back at [facility] Phone [phone] Call back at Work Phone [phone]   Caller: Patient Summary of Call: The medication that was perscribed for him his insurance is not wanting to pay for it.  He can't remember the name of it just that it is something like Abbutorol. Initial call taken by: Clydell Hakim,  January 14, 2010 2:05 PM  Follow-up for Phone Call        spoke with pharmacist and she states price after insurance paid is $45.76. there are no other meds like this that insurance will do any better on.  will forward to MD. Follow-up by: Theresia Lo RN,  January 14, 2010 2:31 PM  Additional Follow-up for Phone Call Additional follow up Details #1::        thanks.  Id prefer patient to have albuterol as rescue inhaler to help his wheezing, but if too expensive can hold off on med for now as will be on steroids.  will need PFTs though, may need advair for COPD. Additional Follow-up by: Eustaquio Boyden  MD,  January 14, 2010 4:30 PM    Additional Follow-up for Phone Call Additional follow up Details #2::    message left to return call. Follow-up by: Theresia Lo RN,  January 15, 2010 8:53 AM  Additional Follow-up for Phone Call Additional follow up Details #3:: Details for Additional Follow-up Action Taken: appointment scheduled for PFT's  in RX clinic 01/17/2010 at 10:30 AM. Additional Follow-up by: Theresia Lo RN,  January 15, 2010 9:52 AM   Appended Document: Rx Prob I'm sorry I should have been more clear.  Would like pt to have PFTs in future after acute resp illness has resolved.  Please cancel PFTs for tomorrow and call patient. Eustaquio Boyden  MD  January 16, 2010 6:07 PM   appointment cancelled for today and rescheduled for 02/03/2010. patient is aware. Theresia Lo RN  January 17, 2010 9:23 AM  Clinical Lists Changes
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
## 2010-11-04 NOTE — Assessment & Plan Note (Signed)
Summary: PFTs - Rx Clinic   Vital Signs:  Patient profile:   50 year old male Height:      71 inches Weight:      231 pounds BMI:     32.33 Pulse rate:   88 / minute BP sitting:   146 / 93  Habits & Providers  Alcohol-Tobacco-Diet     Tobacco Status: current     Tobacco Counseling: Interested in trying Chantix again.      Year Started: 1978     Pack years: 32  Current Medications (verified): 1)  Fentanyl 25 Mcg/hr Pt72 (Fentanyl) .... Apply 1 Patch To Skin As Directed Q 72 Hrs 2)  Percocet 10-650 Mg Tabs (Oxycodone-Acetaminophen) .... Take 1 Tablet By Mouth Qid Prn 3)  Metoprolol Tartrate 100 Mg Tabs (Metoprolol Tartrate) .... One Tab By Mouth Bid 4)  Hydrochlorothiazide 25 Mg Tabs (Hydrochlorothiazide) .... Take One By Mouth Daily 5)  Celexa 40 Mg Tabs (Citalopram Hydrobromide) .... Take One Tablet Daily By Mouth 6)  Eql Fish Oil 1000 Mg  Caps (Omega-3 Fatty Acids) .... 2 Tabs Twice Daily 7)  Aspirin 325 Mg Tabs (Aspirin) .... Take 1 Tab By Mouth Every Day 8)  Testosterone Cypionate 100 Mg/ml  Oil (Testosterone Cypionate) .... 200 Mg Im Every 2 Weeks 1 Vial (Qs One Month) 9)  Protonix 40 Mg Pack (Pantoprazole Sodium) .... Take One By Mouth Daily 10)  Neurontin 300 Mg Caps (Gabapentin) .... Take One By Mouth Two Times A Day 11)  Doxazosin Mesylate 2 Mg Tabs (Doxazosin Mesylate) .... Take One Po Daily 12)  Potassium Chloride Cr 10 Meq Cr-Tabs (Potassium Chloride) .... 20 Meq Daily Please For Hypokalemia 13)  Omeprazole 40 Mg Cpdr (Omeprazole) .... One Daily For Reflux 14)  Chantix Starting Month Pak 0.5 Mg X 11 & 1 Mg X 42 Tabs (Varenicline Tartrate) .... One Daily For 3 Days Then One Two Times A Day As Instructed.  Set Quit Date 1 Wk After Start 15)  Chantix Continuing Month Pak 1 Mg Tabs (Varenicline Tartrate) .... Use As Directed  Allergies (verified): 1)  ! Pcn   Impression & Recommendations:  Problem # 1:  COUGH (ICD-786.2) Assessment Unchanged  Near Normal  Spirometry in current smoker.  Advised of results.  Patient is attempting to quit smoking in the next few weeks around the time he will have hip replacement surgery at [facility].   Orders: PFT Baseline-Pre/Post Bronchodiolator (PFT Baseline-Pre/Pos)  Problem # 2:  TOBACCO ABUSE (ICD-305.1) Assessment: Unchanged  Moderate Nicotine Abuse of:  33 years duration in a patient who EA:VWUJ candidate for success b/c of: lack of previous success with quiting and current level of stress with upcoming surgery AND history of anxiety.  Plan to start  varenicline when disability check arrives. . Counseled on purpose, proper use, and potential adverse effects, including nausea.  Cautioned regarding CNS and CV effects.  Advised to call office if any mood changes or suicidal ideation.  Written information and coupons provided:  F/U Rx Clinic Visit: with new provider Dr. Alvester Morin.   Total time with patient in face-to-face counseling: 45  minutes.    His updated medication list for this problem includes:    Chantix Starting Month Pak 0.5 Mg X 11 & 1 Mg X 42 Tabs (Varenicline tartrate) ..... One daily for 3 days then one two times a day as instructed.  set quit date 1 wk after start    Chantix Continuing Month Pak 1 Mg Tabs (Varenicline tartrate) .[name] KitchenMarland KitchenMarland KitchenMarland Kitchen  Use as directed  Orders: PFT Baseline-Pre/Post Bronchodiolator (PFT Baseline-Pre/Pos)  Complete Medication List: 1)  Fentanyl 25 Mcg/hr Pt72 (Fentanyl) .... Apply 1 patch to skin as directed q 72 hrs 2)  Percocet 10-650 Mg Tabs (Oxycodone-acetaminophen) .... Take 1 tablet by mouth qid prn 3)  Metoprolol Tartrate 100 Mg Tabs (Metoprolol tartrate) .... One tab by mouth bid 4)  Hydrochlorothiazide 25 Mg Tabs (Hydrochlorothiazide) .... Take one by mouth daily 5)  Celexa 40 Mg Tabs (Citalopram hydrobromide) .... Take one tablet daily by mouth 6)  Eql Fish Oil 1000 Mg Caps (Omega-3 fatty acids) .... 2 tabs twice daily 7)  Aspirin 325 Mg Tabs (Aspirin) .... Take 1 tab by mouth  every day 8)  Testosterone Cypionate 100 Mg/ml Oil (Testosterone cypionate) .... 200 mg im every 2 weeks 1 vial (qs one month) 9)  Protonix 40 Mg Pack (Pantoprazole sodium) .... Take one by mouth daily 10)  Neurontin 300 Mg Caps (Gabapentin) .... Take one by mouth two times a day 11)  Doxazosin Mesylate 2 Mg Tabs (Doxazosin mesylate) .... Take one po daily 12)  Potassium Chloride Cr 10 Meq Cr-tabs (Potassium chloride) .... 20 meq daily please for hypokalemia 13)  Omeprazole 40 Mg Cpdr (Omeprazole) .... One daily for reflux 14)  Chantix Starting Month Pak 0.5 Mg X 11 & 1 Mg X 42 Tabs (Varenicline tartrate) .... One daily for 3 days then one two times a day as instructed.  set quit date 1 wk after start 15)  Chantix Continuing Month Pak 1 Mg Tabs (Varenicline tartrate) .... Use as directed  Patient Instructions: 1)  Near normal lung function.   2)  Good luck with hip surgery.  3)  Good luck with quitting smoking.   Prevention & Chronic Care Immunizations   Influenza vaccine: Fluvax MCR  (07/19/2009)   Influenza vaccine due: 07/03/2009    Tetanus booster: 05/05/2004: Done.   Tetanus booster due: 05/05/2014    Pneumococcal vaccine: Not documented  Other Screening   Smoking status: current  (05/01/2010)   Smoking cessation counseling: yes  (05/01/2010)   Target quit date: 05/17/2010  (05/01/2010)  Lipids   Total Cholesterol: 152  (07/19/2009)   LDL: 81  (07/19/2009)   LDL Direct: Not documented   HDL: 41  (07/19/2009)   Triglycerides: 148  (07/19/2009)    SGOT (AST): 17  (04/02/2010)   SGPT (ALT): 19  (04/02/2010)   Alkaline phosphatase: 69  (04/02/2010)   Total bilirubin: 0.5  (04/02/2010)    Lipid flowsheet reviewed?: Yes   Progress toward LDL goal: At goal  Hypertension   Last Blood Pressure: 146 / 93  (05/01/2010)   Serum creatinine: 1.32  (04/02/2010)   Serum potassium 4.3  (04/02/2010)    Hypertension flowsheet reviewed?: Yes   Progress toward BP goal:  Deteriorated  Self-Management Support :   Personal Goals (by the next clinic visit) :      Personal blood pressure goal: 140/90  (07/19/2009)     Personal LDL goal: 100  (07/19/2009)    Hypertension self-management support: BP self-monitoring log, Written self-care plan, Education handout  (11/27/2009)    Hypertension self-management support not done because: Good outcomes  (11/06/2009)    Lipid self-management support: Not documented     Lipid self-management support not done because: Good outcomes  (11/06/2009)   Tobacco Counseling   Currently uses tobacco.    Cigarettes      Year started smoking cigarettes:      1978  Number of packs per day smoked:      1.0     Years smoked:            33     Packs per year:          365     Pack Years:            64  Counseled to quit/cut down on tobacco use.   Readiness to Quit:     Somewhat Ready Cessation Stage:     Contemplative Target Quit Date:     05/17/2010 Previous Quit Attempts   Previously Tried to quit:     Yes # of Previous quit attempts:     1 Longest Successful Quit Period:   6 days  Quit Methods Tried:      -  Chantix  Reason for restarting:      -  stress  Comments: Interested in trying Chantix again.   Smoking Cessation Plan   Readiness:     Somewhat Ready Cessation Stage:   Contemplative Target Quit Date:   05/17/2010  Counseled on:      -  medication use/compliance  Comments: Reeducated about risk of using Chantix.   Patient verbalized understanding of increased Psych and CV risks.   He remains motivated to attempt quitting and is interested in trying to use   Orders Added this Update:  PFT Baseline-Pre/Post Bronchodiolator [PFT Baseline-Pre/Pos]   Pulmonary Function Test Date: 05/01/2010 Height (in.): 71 Gender: Male  Pre-Spirometry FVC    Value: 4.74 L/min   Pred: 4.95 L/min     % Pred: 95 % FEV1    Value: 3.49 L     Pred: 4.03 L     % Pred: 86 % FEV1/FVC  Value: 74 %     Pred: 81 %      % Pred: 91 % FEF 25-75  Value: 2.55 L/min   Pred: 4.13 L/min     % Pred: 61 %  Post-Spirometry FVC    Value: 4.41 L/min   Pred: 4.95 L/min     % Pred: 89 % FEV1    Value: 3.56 L     Pred: 4.03 L     % Pred: 88 % FEV1/FVC  Value: 81 %     Pred: 81 %     % Pred: 100 % FEF 25-75  Value: 3.62 L/min   Pred: 4.13 L/min     % Pred: 87 %  Comments: Effort - Excellent Calculated Lung Age: 14 Sginificant changein FEF 25-75%  Evaluation: near normal - No significant bronchodilator response  Recommendations: Quit Smoking.

## 2010-11-04 NOTE — Letter (Signed)
Summary: Results Follow-up Letter  [facility]  [address]   [place], [place] [address]   Phone: [phone]  Fax: [phone]    02/10/2010  [address] [place], [place]  [address]  Dear [name],   The following are the results of your recent test(s):  Test     Result      Your kidney function has improved to a creatinine of 1.29.  Even though this is almost normal, we will still need to keep a close eye on your kidneys.  Your potassium level was still low at 3.2 (normal is greater than 3.5).  Please make sure you are taking the potassium supplements.  See you soon.  _________________________________________________________   Sincerely,  Jeffrey Boyden  MD [facility]

## 2010-11-04 NOTE — Assessment & Plan Note (Signed)
Summary: injection/eo  Nurse Visit   Vitals Entered By: Arlyss Repress CMA, (Feb 24, 2010 2:32 PM)  Allergies: 1)  ! Pcn  Medication Administration  Injection # 1:    Medication: Testosterone Cypionate to 100 MG    Diagnosis: TESTOSTERONE DEFICIENCY (ICD-257.2)    Route: IM    Site: LUOQ gluteus    Exp Date: 08/05/2012    Lot #: [id number]    Mfr: sandoz    Comments: 200mg  IM given. pt did bring in own supply of Testosterone.    Patient tolerated injection without complications    Given by: Arlyss Repress CMA, (Feb 24, 2010 2:34 PM)  Orders Added: 1)  Est Level 1- [facility] [04540] 2)  Admin of Injection (IM/SQ) [98119]   Medication Administration  Injection # 1:    Medication: Testosterone Cypionate to 100 MG    Diagnosis: TESTOSTERONE DEFICIENCY (ICD-257.2)    Route: IM    Site: LUOQ gluteus    Exp Date: 08/05/2012    Lot #: [id number]    Mfr: sandoz    Comments: 200mg  IM given. pt did bring in own supply of Testosterone.    Patient tolerated injection without complications    Given by: Arlyss Repress CMA, (Feb 24, 2010 2:34 PM)  Orders Added: 1)  Est Level 1- [facility] [56213] 2)  Admin of Injection (IM/SQ) [08657]

## 2010-11-04 NOTE — Assessment & Plan Note (Signed)
Summary: SHOT/KH  Nurse Visit   Allergies: 1)  ! Pcn  Medication Administration  Injection # 1:    Medication: Testosterone Cypionat 200mg  ing    Diagnosis: TESTOSTERONE DEFICIENCY (ICD-257.2)    Route: IM    Site: LUOQ gluteus    Exp Date: 09/2012    Lot #: [id number]    Mfr: sandoz    Comments:  100 mg per ml  (2 ml given ). patient brings his own medication, no charge for medication    Patient tolerated injection without complications    Given by: Theresia Lo RN (July 21, 2010 1:52 PM)  Orders Added: 1)  Admin of Injection (IM/SQ) [[id number]]   Medication Administration  Injection # 1:    Medication: Testosterone Cypionat 200mg  ing    Diagnosis: TESTOSTERONE DEFICIENCY (ICD-257.2)    Route: IM    Site: LUOQ gluteus    Exp Date: 09/2012    Lot #: [id number]    Mfr: sandoz    Comments:  100 mg per ml  (2 ml given ). patient brings his own medication, no charge for medication    Patient tolerated injection without complications    Given by: Theresia Lo RN (July 21, 2010 1:52 PM)  Orders Added: 1)  Admin of Injection (IM/SQ) [phone]

## 2010-11-04 NOTE — Progress Notes (Signed)
Summary: Rx Req  Phone Note Refill Request Call back at Home Phone [phone] Message from:  Patient  Refills Requested: Medication #1:  FENTANYL 25 MCG/HR PT72 Apply 1 patch to skin as directed q 72 hrs  Medication #2:  PERCOCET 10-650 MG TABS Take 1 tablet by mouth QID PRN pt lives [place] so pls call in enough time for him to come and get it  Initial call taken by: Clydell Hakim,  Feb 27, 2010 2:47 PM  Follow-up for Phone Call        was sent 2 days ago, but Dr Reece Agar is out of the office. Follow-up by: De Nurse,  Feb 28, 2010 2:59 PM    Prescriptions: PERCOCET 10-650 MG TABS (OXYCODONE-ACETAMINOPHEN) Take 1 tablet by mouth QID PRN  #100 x 0   Entered and Authorized by:   Pearlean Brownie MD   Signed by:   Pearlean Brownie MD on 02/28/2010   Method used:   Handwritten   RxID:   [id number] FENTANYL 25 MCG/HR PT72 (FENTANYL) Apply 1 patch to skin as directed q 72 hrs  #10 x 0   Entered and Authorized by:   Pearlean Brownie MD   Signed by:   Pearlean Brownie MD on 02/28/2010   Method used:   Handwritten   RxID:   [id number]   Above written.   Pt needs to be seen before refills and arrange to have an office visit scheduled EVERY time before he runs out of medications.  We should NOT refill at the last minute when he calls in. Pearlean Brownie MD  Feb 28, 2010 4:20 PM   Allergies: 1)  ! Pcn   Impression & Recommendations:  Problem # 1:  BACK PAIN, CHRONIC (ICD-724.5) He called into on Friday afternoon needing refills.  Seems to have taken appropriately in the past.  Will Refill.   Would suggest pt needs to be seen before refills and arrange to have an office visit scheduled EVERY time before he runs out of medications.  We should NOT refill at the last minute when he calls in. His updated medication list for this problem includes:    Fentanyl 25 Mcg/hr Pt72 (Fentanyl) .[name]... Apply 1 patch to skin as directed q 72 hrs    Percocet 10-650 Mg Tabs  (Oxycodone-acetaminophen) .[name]... Take 1 tablet by mouth qid prn    Aspirin 325 Mg Tabs (Aspirin) .[name]... Take 1 tab by mouth every day  Complete Medication List: 1)  Fentanyl 25 Mcg/hr Pt72 (Fentanyl) .... Apply 1 patch to skin as directed q 72 hrs 2)  Percocet 10-650 Mg Tabs (Oxycodone-acetaminophen) .... Take 1 tablet by mouth qid prn 3)  Metoprolol Tartrate 100 Mg Tabs (Metoprolol tartrate) .... One tab by mouth bid 4)  Hydrochlorothiazide 25 Mg Tabs (Hydrochlorothiazide) .... Take one by mouth daily 5)  Celexa 40 Mg Tabs (Citalopram hydrobromide) .... Take one tablet daily by mouth 6)  Eql Fish Oil 1000 Mg Caps (Omega-3 fatty acids) .... 2 tabs twice daily 7)  Aspirin 325 Mg Tabs (Aspirin) .... Take 1 tab by mouth every day 8)  Testosterone Cypionate 100 Mg/ml Oil (Testosterone cypionate) .... 200 mg im every 2 weeks 1 vial (qs one month) 9)  Protonix 40 Mg Pack (Pantoprazole sodium) .... Take one by mouth daily 10)  Neurontin 300 Mg Caps (Gabapentin) .... Take one by mouth two times a day 11)  Doxazosin Mesylate 2 Mg Tabs (Doxazosin mesylate) .... Take one po daily 12)  Proventil Hfa  108 (90 Base) Mcg/act Aers (Albuterol sulfate) .... Or equivalent.  2 puffs q4-6 hours as needed wheezing 13)  Doxycycline Monohydrate 100 Mg Caps (Doxycycline monohydrate) .... One by mouth two times a day x 10 days 14)  Prednisone 50 Mg Tabs (Prednisone) .... One daily for 10 days 15)  Potassium Chloride Cr 10 Meq Cr-tabs (Potassium chloride) .... 20 meq daily please for hypokalemia

## 2010-11-04 NOTE — Assessment & Plan Note (Signed)
Summary: New MD Visit: HTN, DM, Pain    Vital Signs:  Patient profile:   50 year old male Height:      71 inches Weight:      221 pounds BMI:     30.93 Pulse rate:   100 / minute BP sitting:   130 / 86  (right arm) Cuff size:   regular  Vitals Entered By: Arlyss Repress CMA, (June 23, 2010 8:44 AM) CC: meet new doctor, Depression, Hypertension Management Is Patient Diabetic? No Pain Assessment Patient in pain? yes     Location: hip Intensity: 7 Onset of pain  Chronic   Primary Care Gale Hulse:  Doree Albee MD  CC:  meet new doctor, Depression, and Hypertension Management.  History of Present Illness: Hip Pain: Pt s/p replacement of L hip in setting of DePuy hip recall approx 3 weeks ago at [facility]  per pt. Imporved pain and ROM since replacement per pt. Pt also reports dx/o "staph infection" in joint, recieving ongoing IV abx for infection. Unclear of which antibiotic per pt. No fevers, rash, or worsening in baseline pain per pt.   Depression History:      The patient comes in today for his first follow up visit for depression.  The patient is having a depressed mood most of the day but denies diminished interest in his usual daily activities.  Positive alarm features for depression include fatigue (loss of energy) and impaired concentration (indecisiveness).  However, he denies significant weight loss, insomnia, hypersomnia, and recurrent thoughts of death or suicide.        Comments:  Pt feels that mood has declined over last few months. Feels that medication initially worked, but isnt working as well as it was from before.  Hypertension History:      He complains of headache, but denies chest pain, palpitations, dyspnea with exertion, orthopnea, PND, peripheral edema, and visual symptoms.        Positive major cardiovascular risk factors include male age 22 years old or older, hyperlipidemia, hypertension, and current tobacco user.        Positive history for target organ  damage include prior stroke (or TIA).      Habits & Providers  Alcohol-Tobacco-Diet     Tobacco Status: current     Tobacco Counseling: to quit use of tobacco products     Cigarette Packs/Day: 0.5  Allergies: 1)  ! Pcn  Social History: Packs/Day:  0.5  Physical Exam  General:  alert and well-developed.   Head:  normocephalic and atraumatic.   Eyes:  vision grossly intact.   Mouth:  good dentition.   Neck:  supple and full ROM.   Lungs:  normal respiratory effort.   Heart:  normal rate, regular rhythm, and no murmur.   Abdomen:  soft, non-tender, and normal bowel sounds.   Msk:  No significant joint tenderness in hips Neurologic:  Decreased sensation in R hand  Skin:  PICC line in RUE CDI   Impression & Recommendations:  Problem # 1:  ESSENTIAL HYPERTENSION (ICD-401.9) Assessment Improved BPs well controlled on current regimen. Will not change. Will check BMET to assess renal function. Will also obtain Sleep study to assess if OSA is contributing to increased BP, fatigue, and decreased mood.  His updated medication list for this problem includes:    Metoprolol Tartrate 100 Mg Tabs (Metoprolol tartrate) ..... One tab by mouth bid    Hydrochlorothiazide 25 Mg Tabs (Hydrochlorothiazide) .[name]... Take one by mouth daily  Doxazosin Mesylate 2 Mg Tabs (Doxazosin mesylate) .[name]... Take one po daily  Orders: T-Basic Metabolic Panel [phone]) Split Night (Split Night)  Problem # 2:  DEPRESSIVE DISORDER NOT ELSEWHERE CLASSIFIED (ICD-311) Assessment: Deteriorated Will increase celexa to max dose to help overall mood. Will consider addition of adjunctive medication (? buspar vs wellbutrin in settingof current nicotine use) at next visit. Plan to reassess in 2-4 weeks.  His updated medication list for this problem includes:    Celexa 20 Mg Tabs (Citalopram hydrobromide) .[name]... Take 3 tablets daily  Problem # 3:  RENAL INSUFFICIENCY, CHRONIC (ICD-585.9) Wil check BMET to assess  renal function.   Problem # 4:  TOBACCO ABUSE (ICD-305.1) Discussed smoking cessation with patient. Was on chantix previously w/ some improvement in smoking habit. Agreeable to smoking cessation re-attempt in future. Will readress at next visit.  His updated medication list for this problem includes:    Chantix Starting Month Pak 0.5 Mg X 11 & 1 Mg X 42 Tabs (Varenicline tartrate) ..... One daily for 3 days then one two times a day as instructed.  set quit date 1 wk after start    Chantix Continuing Month Pak 1 Mg Tabs (Varenicline tartrate) ..... Use as directed  Problem # 5:  OTHER CHRONIC PAIN (ICD-338.29) Currently up to date on pain meds. Will refill at end of month. Has been compliant with medication regimen thus far. Will likely fill out pain contract at next patient visit.   Problem # 6:  HIP REPLACEMENT, BILATERAL, HX OF (DEPUY) (ICD-V43.64) Pending surgical followup for L hip re-replacement. Gave pt infectious red flags in setting of current abx treatment. Will attempt to obtain records/get ROI filled out at next visit.   Complete Medication List: 1)  Fentanyl 25 Mcg/hr Pt72 (Fentanyl) .... Apply 1 patch to skin as directed q 72 hrs 2)  Percocet 10-650 Mg Tabs (Oxycodone-acetaminophen) .... Take 1 tablet by mouth qid prn 3)  Metoprolol Tartrate 100 Mg Tabs (Metoprolol tartrate) .... One tab by mouth bid 4)  Hydrochlorothiazide 25 Mg Tabs (Hydrochlorothiazide) .... Take one by mouth daily 5)  Celexa 20 Mg Tabs (Citalopram hydrobromide) .... Take 3 tablets daily 6)  Eql Fish Oil 1000 Mg Caps (Omega-3 fatty acids) .... 2 tabs twice daily 7)  Aspirin 325 Mg Tabs (Aspirin) .... Take 1 tab by mouth every day 8)  Testosterone Cypionate 100 Mg/ml Oil (Testosterone cypionate) .... 200 mg im every 2 weeks 1 vial (qs one month) 9)  Protonix 40 Mg Pack (Pantoprazole sodium) .... Take one by mouth daily 10)  Neurontin 300 Mg Caps (Gabapentin) .... Take one by mouth two times a day 11)   Doxazosin Mesylate 2 Mg Tabs (Doxazosin mesylate) .... Take one po daily 12)  Potassium Chloride Cr 10 Meq Cr-tabs (Potassium chloride) .... 20 meq daily please for hypokalemia 13)  Omeprazole 40 Mg Cpdr (Omeprazole) .... One daily for reflux 14)  Chantix Starting Month Pak 0.5 Mg X 11 & 1 Mg X 42 Tabs (Varenicline tartrate) .... One daily for 3 days then one two times a day as instructed.  set quit date 1 wk after start 15)  Chantix Continuing Month Pak 1 Mg Tabs (Varenicline tartrate) .... Use as directed  Depression Assessment/Plan: Assessment of Depressive Symptoms:  Some Improvement  Hypertension Assessment/Plan:      The patient's hypertensive risk group is category C: Target organ damage and/or diabetes.  His calculated 10 year risk of coronary heart disease is 7 %.  Today's blood  pressure is 130/86.    Patient Instructions: 1)  It was good to meet you today 2)  We will increase your celexa to help with your mood 3)  We will also check you for sleep apnea as this can explain some of your symptoms 4)  If you have any fever or worsening pain, please give [place] a call 5)  We wil check a testosterone level in a few weeks 6)  Otherwise come back in 1-2 months 7)  God Bless, 8)  Doree Albee MD  Prescriptions: CELEXA 20 MG TABS (CITALOPRAM HYDROBROMIDE) take 3 tablets daily  #90 x 3   Entered and Authorized by:   Doree Albee MD   Signed by:   Doree Albee MD on 06/23/2010   Method used:   [facility] to        [facility] [phone]* (retail)       [address]       [place], [place]  [address]       Ph: [phone] or [phone]       Fax: [phone]   RxID:   [id number]    Prevention & Chronic Care Immunizations   Influenza vaccine: Fluvax MCR  (07/19/2009)   Influenza vaccine due: 07/03/2009    Tetanus booster: 05/05/2004: Done.   Tetanus booster due: 05/05/2014    Pneumococcal vaccine: Not documented  Other  Screening   Smoking status: current  (06/23/2010)   Smoking cessation counseling: yes  (05/01/2010)   Target quit date: 05/17/2010  (05/01/2010)  Lipids   Total Cholesterol: 152  (07/19/2009)   LDL: 81  (07/19/2009)   LDL Direct: Not documented   HDL: 41  (07/19/2009)   Triglycerides: 148  (07/19/2009)    SGOT (AST): 17  (04/02/2010)   SGPT (ALT): 19  (04/02/2010)   Alkaline phosphatase: 69  (04/02/2010)   Total bilirubin: 0.5  (04/02/2010)    Lipid flowsheet reviewed?: Yes   Progress toward LDL goal: At goal  Hypertension   Last Blood Pressure: 130 / 86  (06/23/2010)   Serum creatinine: 1.32  (04/02/2010)   BMP action: Ordered   Serum potassium 4.3  (04/02/2010)    Hypertension flowsheet reviewed?: Yes   Progress toward BP goal: At goal  Self-Management Support :   Personal Goals (by the next clinic visit) :      Personal blood pressure goal: 140/90  (07/19/2009)     Personal LDL goal: 100  (07/19/2009)    Hypertension self-management support: Written self-care plan  (06/23/2010)   Hypertension self-care plan printed.    Hypertension self-management support not done because: Good outcomes  (11/06/2009)    Lipid self-management support: Written self-care plan  (06/23/2010)   Lipid self-care plan printed.    Lipid self-management support not done because: Good outcomes  (11/06/2009)  Appended Document: New MD Visit: HTN, DM, Pain  No dx of DM; supposed to be depression Doree Albee MD

## 2010-11-04 NOTE — Progress Notes (Signed)
Summary: Rx Req  Phone Note Refill Request Call back at Home Phone [phone] Message from:  Patient  Refills Requested: Medication #1:  PERCOCET 10-650 MG TABS Take 1 tablet by mouth QID PRN  Medication #2:  FENTANYL 25 MCG/HR PT72 Apply 1 patch to skin as directed q 72 hrs Initial call taken by: Clydell Hakim,  November 29, 2009 2:34 PM  Follow-up for Phone Call        filled.  please let know ready to be picked up.  handed to red team. Follow-up by: Eustaquio Boyden  MD,  November 29, 2009 3:01 PM    Prescriptions: PERCOCET 10-650 MG TABS (OXYCODONE-ACETAMINOPHEN) Take 1 tablet by mouth QID PRN  #100 x 0   Entered and Authorized by:   Eustaquio Boyden  MD   Signed by:   Eustaquio Boyden  MD on 11/29/2009   Method used:   Print then Give to Patient   RxID:   [id number] FENTANYL 25 MCG/HR PT72 (FENTANYL) Apply 1 patch to skin as directed q 72 hrs  #10 x 0   Entered and Authorized by:   Eustaquio Boyden  MD   Signed by:   Eustaquio Boyden  MD on 11/29/2009   Method used:   Print then Give to Patient   RxID:   [id number]  Pt contacted. Will pick up Mon.  Placed in file at front desk. Starleen Blue RN  November 29, 2009 3:29 PM

## 2010-11-04 NOTE — Assessment & Plan Note (Signed)
Summary: SHOT/KH  Nurse Visit   Allergies: 1)  ! Pcn  Medication Administration  Injection # 1:    Medication: Testosterone Cypionate to 100 MG    Diagnosis: TESTOSTERONE DEFICIENCY (ICD-257.2)    Route: IM    Site: RUOQ gluteus    Exp Date: 09/2012    Lot #: [id number]    Mfr: [email or web address]    Comments: testosterone 200 mg given IM. no charge for medication, patient brings his own medication    Patient tolerated injection without complications    Given by: Theresia Lo RN (June 02, 2010 4:36 PM)  Orders Added: 1)  Admin of Injection (IM/SQ) [04540] Prescriptions: POTASSIUM CHLORIDE CR 10 MEQ CR-TABS (POTASSIUM CHLORIDE) 20 mEq daily please for hypokalemia  #30 x 3   Entered and Authorized by:   Doree Albee MD   Signed by:   Doree Albee MD on 06/02/2010   Method used:   [facility]        [facility] [address] [phone]* (retail)       [address]       [place], [place]  [address]       Ph: [phone] or [phone]       Fax: [phone]   RxID:   [id number]    Medication Administration  Injection # 1:    Medication: Testosterone Cypionate to 100 MG    Diagnosis: TESTOSTERONE DEFICIENCY (ICD-257.2)    Route: IM    Site: RUOQ gluteus    Exp Date: 09/2012    Lot #: [id number]    Mfr: [email or web address]    Comments: testosterone 200 mg given IM. no charge for medication, patient brings his own medication    Patient tolerated injection without complications    Given by: Theresia Lo RN (June 02, 2010 4:36 PM)  Orders Added: 1)  Admin of Injection (IM/SQ) [phone]   patient states he needs refill on pain meds and also potassium . states he  has not had potassium since he was in hospital for hip surgery. states he did not get any additional pain medicatios following hip surgery states he has been taking what he usually takes for pain. Hip surgery 05/12/2010. appointment scheduled with Dr. Alvester Morin for 06/23/2010. Theresia Lo RN  June 02, 2010  4:39 PM

## 2010-11-04 NOTE — Assessment & Plan Note (Signed)
Summary: testerone injection  Nurse Visit   Allergies: 1)  ! Pcn  Medication Administration  Injection # 1:    Medication: Testosterone Cypionat 200mg  ing    Diagnosis: TESTOSTERONE DEFICIENCY (ICD-257.2)    Route: IM    Site: RUOQ gluteus    Exp Date: 09/2012    Lot #: [id number]    Mfr: Sandoz    Comments: testosterone 100 mg per ML given (2 ml given . no charge for medication patient brings in own medication.    Patient tolerated injection without complications    Given by: Theresia Lo RN (August 13, 2010 9:52 AM)  Orders Added: 1)  Admin of Injection (IM/SQ) [phone]   Medication Administration  Injection # 1:    Medication: Testosterone Cypionat 200mg  ing    Diagnosis: TESTOSTERONE DEFICIENCY (ICD-257.2)    Route: IM    Site: RUOQ gluteus    Exp Date: 09/2012    Lot #: [id number]    Mfr: Sandoz    Comments: testosterone 100 mg per ML given (2 ml given . no charge for medication patient brings in own medication.    Patient tolerated injection without complications    Given by: Theresia Lo RN (August 13, 2010 9:52 AM)  Orders Added: 1)  Admin of Injection (IM/SQ) [phone]

## 2010-11-06 NOTE — Progress Notes (Signed)
Summary: refill  Phone Note Refill Request Call back at Home Phone [phone] Message from:  Patient  Refills Requested: Medication #1:  PERCOCET 10-650 MG TABS Take 1 tablet by mouth QID PRN  Medication #2:  FENTANYL 25 MCG/HR PT72 Apply 1 patch to skin as directed q 72 hrs Please call when ready  Initial call taken by: De Nurse,  October 22, 2010 2:04 PM  Follow-up for Phone Call        pt is calling again - needs to know before 4pm Follow-up by: De Nurse,  October 27, 2010 2:13 PM  Additional Follow-up for Phone Call Additional follow up Details #1::        pt is calling again- is coming to [place] this morning and wants to pick up. Additional Follow-up by: De Nurse,  October 28, 2010 8:42 AM    Additional Follow-up for Phone Call Additional follow up Details #2::    paged Dr. Alvester Morin . he advises he will bring over at lunctime.  message left on voicemail that he can pick up after 2:00 PM. Follow-up by: Theresia Lo RN,  October 28, 2010 9:32 AM  Additional Follow-up for Phone Call Additional follow up Details #3:: Details for Additional Follow-up Action Taken: Scripts given to Clementeen Graham to bring to clinic after lunch.  Doree Albee MD October 28, 2010 1:06 PM

## 2010-11-06 NOTE — Progress Notes (Signed)
Summary: refill  Phone Note Refill Request Call back at Home Phone [phone] Message from:  Patient  Refills Requested: Medication #1:  PERCOCET 10-650 MG TABS Take 1 tablet by mouth QID PRN  Medication #2:  FENTANYL 25 MCG/HR PT72 Apply 1 patch to skin as directed q 72 hrs Initial call taken by: De Nurse,  September 22, 2010 10:45 AM    Prescriptions: FENTANYL 25 MCG/HR PT72 (FENTANYL) Apply 1 patch to skin as directed q 72 hrs  #10 x 0   Entered and Authorized by:   Doree Albee MD   Signed by:   Doree Albee MD on 09/23/2010   Method used:   Handwritten   RxID:   [id number] PERCOCET 10-650 MG TABS (OXYCODONE-ACETAMINOPHEN) Take 1 tablet by mouth QID PRN  #100 x 0   Entered and Authorized by:   Doree Albee MD   Signed by:   Doree Albee MD on 09/23/2010   Method used:   Handwritten   RxID:   [id number]

## 2010-11-06 NOTE — Miscellaneous (Signed)
Summary: PROBLEM LIST UPDATE   Clinical Lists Changes  Problems: Removed problem of NEED PROPHYLACTIC VACCINATION&INOCULATION FLU (ICD-V04.81)

## 2010-11-06 NOTE — Assessment & Plan Note (Signed)
Summary: f/u eo   Vital Signs:  Patient profile:   50 year old male Height:      71 inches Weight:      237 pounds BMI:     33.17 Pulse rate:   63 / minute BP sitting:   145 / 91  (left arm) Cuff size:   large  Vitals Entered By: Tessie Fass CMA (September 03, 2010 10:12 AM) CC: F/u, Hypertension Management, Depression, Headache Is Patient Diabetic? No Pain Assessment Patient in pain? yes        CC:  F/u, Hypertension Management, Depression, and Headache.  Depression History:      The patient denies significant weight loss, significant weight gain, insomnia, hypersomnia, and psychomotor agitation.        Comments:  Mood has improved since increase of celexa. Has never seen a psychiatrist/therapist in the past for his mood. .  Depression Treatment History:  Prior Medication Used:   Start Date: Assessment of Effect:   Comments:  Celexa (citalopram)     11/18/2007   some improvement     Still having issues with sleep  Headache HPI:      The patient comes in for chronic management of stable headaches.  Since the last visit, the frequency of headaches have not changed, and the intensity of the headaches have not changed.        The location of the headaches are bilateral.        The patient denies mylagia, fever, malaise, weight loss, scalp tenderness, and jaw claudication.         Headache Treatment History:      NSAID's were tried but not effective.  Calcium channel blockers, anticonvulsants, and tricyclic antidepressants have not been tried.    Hypertension History:      He complains of headache, but denies chest pain, palpitations, dyspnea with exertion, orthopnea, PND, peripheral edema, and neurologic problems.  Further comments include: Pt reprots headache since starting doxycycline for septic arthrotos ppx. [name]        Positive major cardiovascular risk factors include male age 11 years old or older, hyperlipidemia, hypertension, and current tobacco user.        Positive  history for target organ damage include prior stroke (or TIA).       Habits & Providers  Alcohol-Tobacco-Diet     Tobacco Status: current     Tobacco Counseling: to quit use of tobacco products     Cigarette Packs/Day: 0.75  Allergies: 1)  ! Pcn  Social History: Packs/Day:  0.75  Physical Exam  General:  alert and well-developed.   Head:  normocephalic and atraumatic.   Eyes:  vision grossly intact, pupils equal, pupils round, and pupils reactive to light.   Ears:  R ear normal and L ear normal.   Nose:  no external deformity.   Mouth:  good dentition.   Neck:  supple and full ROM.   Lungs:  normal respiratory effort and normal breath sounds.   Heart:  normal rate, regular rhythm, and no murmur.   Abdomen:  soft, non-tender, and normal bowel sounds.   Extremities:  2+ peripheral pulses, no edema  Neurologic:  alert & oriented X3.    Headache Neurologic Exam:    Motor Exam/Strength-Left:       Left Biceps Flexion ( ):  normal       Left Triceps Extension ( ):  normal       Left Hand Grasp ( ):  normal  Left Deltoid ( ):  normal       Left Hip Flexors ( ):  normal       Left Ankle Dorsiflexion (L5,L4):  normal       Left Great Toe Dorsiflexion (L5,L4):  normal       Left Heel Walk (L5,some L4):  normal       Left Single Squat & Rise-Quads (L4):  normal       Left Toe Walk-calf (S1):  normal    Motor Exam/Strength-Right:       Right Biceps Flexion ( ):  normal       Right Triceps Extension ( ):  normal       Right Hand Grasp ( ):  normal       Right Deltoid ( ):  normal       Right Hip Flexors ( ):  normal       Right Ankle Dorsiflexion (L5,L4):  normal       Right Great Toe Dorsiflexion (L5,L4):  normal       Right Heel Walk (L5,some L4):  normal       Right Single Squat & Rise-Quads (L4):  normal       Right Toe Walk-calf (S1):  normal    Sensory Exam/Pinprick-Left:       Left Face:  normal       Left Upper Extremity:  normal       Left Lower Extremity:   normal       Left Medial Foot (L4):  normal       Left Dorsal Foot (L5):  normal       Left Lateral Foot (S1):  normal    Sensory Exam/Pinprick-Right:       Right Face:  normal       Right Upper Extremity:  normal       Right Lower Extremity:  normal       Right Medial Foot (L4):  normal       Right Dorsal Foot (L5):  normal       Right Lateral Foot (S1):  normal   Impression & Recommendations:  Problem # 1:  HEADACHE (ICD-784.0)  ? of medication induced HA from doxycycline vs. chronic narcotic induced HA given longstanding narcotic use. Pt instructed to go back to [facility] for question of possible switching prophylactic antibiotic. Will also start on norvasc for concominant BO control. May consider addition of TCA in the future if HA unchanged w/ possible medication change.  His updated medication list for this problem includes:    Fentanyl 25 Mcg/hr Pt72 (Fentanyl) .[name]... Apply 1 patch to skin as directed q 72 hrs    Percocet 10-650 Mg Tabs (Oxycodone-acetaminophen) .[name]... Take 1 tablet by mouth qid prn    Metoprolol Tartrate 100 Mg Tabs (Metoprolol tartrate) ..... One tab by mouth bid    Aspirin 325 Mg Tabs (Aspirin) .[name]... Take 1 tab by mouth every day  Orders: [facility]- Est  Level 4 ([address])  Problem # 2:  ESSENTIAL HYPERTENSION (ICD-401.9) Plan to start pt on norvasc for BP control. Will also check Cr and K. Will follow up in 2-4 weeks.  His updated medication list for this problem includes:    Metoprolol Tartrate 100 Mg Tabs (Metoprolol tartrate) ..... One tab by mouth bid    Doxazosin Mesylate 2 Mg Tabs (Doxazosin mesylate) .[name]... Take one po daily    Norvasc 10 Mg Tabs (Amlodipine besylate) .[name]... 1 tablet daily  Orders: Basic [facility] (  [address]) [facility]- Est  Level 4 ([address])  Problem # 3:  DEPRESSIVE DISORDER NOT ELSEWHERE CLASSIFIED (ICD-311)  Curently stable. But could benefit from psychotherapy given that pt lives alone. Discussed with pt. Will set up in the future as pt is  agreeable.   Orders: [facility]- Est  Level 4 ([address])  Problem # 4:  Hx of BENIGN PROSTATIC HYPERTROPHY, WITH OBSTRUCTION (ICD-600.01)  Will start on finasteride to help transition pt off of alpha blocker.  Finasteride also noted to increase testosterone levels in setting of baseline testosterone deficiency. Will followup in 2-3 weeks. Pt agreeable to plan.   Orders: [facility]- Est  Level 4 ([address])  Complete Medication List: 1)  Fentanyl 25 Mcg/hr Pt72 (Fentanyl) .... Apply 1 patch to skin as directed q 72 hrs 2)  Percocet 10-650 Mg Tabs (Oxycodone-acetaminophen) .... Take 1 tablet by mouth qid prn 3)  Metoprolol Tartrate 100 Mg Tabs (Metoprolol tartrate) .... One tab by mouth bid 4)  Eql Fish Oil 1000 Mg Caps (Omega-3 fatty acids) .... 2 tabs twice daily 5)  Aspirin 325 Mg Tabs (Aspirin) .... Take 1 tab by mouth every day 6)  Testosterone Cypionate 100 Mg/ml Oil (Testosterone cypionate) .... 200 mg im every 2 weeks 1 vial (qs one month) 7)  Protonix 40 Mg Pack (Pantoprazole sodium) .... Take one by mouth daily 8)  Neurontin 300 Mg Caps (Gabapentin) .... 2 tabs twice daily 9)  Doxazosin Mesylate 2 Mg Tabs (Doxazosin mesylate) .... Take one po daily 10)  Potassium Chloride Cr 10 Meq Cr-tabs (Potassium chloride) .... 20 meq daily please for hypokalemia 11)  Omeprazole 40 Mg Cpdr (Omeprazole) .... One daily for reflux 12)  Chantix Starting Month Pak 0.5 Mg X 11 & 1 Mg X 42 Tabs (Varenicline tartrate) .... One daily for 3 days then one two times a day as instructed.  set quit date 1 wk after start 13)  Chantix Continuing Month Pak 1 Mg Tabs (Varenicline tartrate) .... Use as directed 14)  Norvasc 10 Mg Tabs (Amlodipine besylate) .[name].. 1 tablet daily 15)  Finasteride 5 Mg Tabs (Finasteride) .[name].. 1 tablet daily  Hypertension Assessment/Plan:      The patient's hypertensive risk group is category C: Target organ damage and/or diabetes.  His calculated 10 year risk of coronary heart disease is 9 %.  Today's  blood pressure is 145/91.  His blood pressure goal is < 140/90.  Patient Instructions: 1)  It was good to see you again 2)  I will get some baseline labs to look at your kidney function 3)  I will start you on norvasc for your blood pressure 4)  I will also start you on a medication for your prostate 5)  Stop taking BC powders for your headache as this can worsen your heartburn and kidney function 6)  I would likel to refer you to see a therapist for your mood  7)  Come back to see me in 1 month 8)  Otherwise call with any questions 9)  God Bless,  10)  Doree Albee MD  Prescriptions: FINASTERIDE 5 MG TABS (FINASTERIDE) 1 tablet daily  #30 x 6   Entered and Authorized by:   Doree Albee MD   Signed by:   Doree Albee MD on 09/03/2010   Method used:   Electronically to        [facility] [address] [phone] (retail)       [address]       [place]  [place], [place]  [address]       Ph: [phone] or [phone]       Fax: [phone]   RxID:   [id number]    Orders Added: 1)  Basic Met-[facility] [phone] 2)  [facility]- Est  Level 4 [34742]  Appended Document: f/u eo    Clinical Lists Changes  Orders: Added new Test order of [facility]- Est  Level 4 (59563) - Signed

## 2010-11-06 NOTE — Assessment & Plan Note (Signed)
Summary: SHOT/KH  Nurse Visit   Allergies: 1)  ! Pcn  Medication Administration  Injection # 1:    Medication: Testosterone Cypionat 200mg  ing    Diagnosis: TESTOSTERONE DEFICIENCY (ICD-257.2)    Route: IM    Site: RUOQ gluteus    Exp Date: 11/18/2012    Lot #: [id number]    Mfr: [email or web address]    Comments: to return in 2 wks    Patient tolerated injection without complications    Given by: Golden Circle RN (October 15, 2010 10:52 AM)  Orders Added: 1)  Testosterone Cypionat 200mg  ing [J1080] 2)  Est Level 1- [facility] [01027]

## 2010-11-06 NOTE — Assessment & Plan Note (Signed)
Summary: SHOT/KH  Nurse Visit   Allergies: 1)  ! Pcn  Medication Administration  Injection # 1:    Medication: Testosterone Cypionat 200mg  ing    Diagnosis: TESTOSTERONE DEFICIENCY (ICD-257.2)    Route: IM    Site: RUOQ gluteus    Exp Date: 02/2013    Lot #: [id number]    Mfr: [email or web address]    Comments: Testosterone 100 mg per ml , 2 ml were given. no charge for medication. patient bring in his own medication.    Patient tolerated injection without complications    Given by: Theresia Lo RN (September 17, 2010 11:22 AM)  Orders Added: 1)  Admin of Injection (IM/SQ) [phone]   Medication Administration  Injection # 1:    Medication: Testosterone Cypionat 200mg  ing    Diagnosis: TESTOSTERONE DEFICIENCY (ICD-257.2)    Route: IM    Site: RUOQ gluteus    Exp Date: 02/2013    Lot #: [id number]    Mfr: [email or web address]    Comments: Testosterone 100 mg per ml , 2 ml were given. no charge for medication. patient bring in his own medication.    Patient tolerated injection without complications    Given by: Theresia Lo RN (September 17, 2010 11:22 AM)  Orders Added: 1)  Admin of Injection (IM/SQ) [phone]   patient request refill on pain med . would like to pick up 09/25/2010. will forward message to MD.  Theresia Lo RN  September 17, 2010 11:24 AM

## 2010-11-06 NOTE — Assessment & Plan Note (Signed)
Summary: injection  Nurse Visit   Allergies: 1)  ! Pcn  Medication Administration  Injection # 1:    Medication: Testosterone Cypionat 200mg  ing    Diagnosis: TESTOSTERONE DEFICIENCY (ICD-257.2)    Route: IM    Site: LUOQ gluteus    Exp Date: 02/2013    Lot #: [id number]    Mfr: [email or web address]    Comments: testosterone 100 mg/ ml   2 cc given IM. no charge for medication . patient brings in his own medication    Patient tolerated injection without complications    Given by: Theresia Lo RN (October 28, 2010 2:05 PM)  Orders Added: 1)  Admin of Injection (IM/SQ) [04540]   Medication Administration  Injection # 1:    Medication: Testosterone Cypionat 200mg  ing    Diagnosis: TESTOSTERONE DEFICIENCY (ICD-257.2)    Route: IM    Site: LUOQ gluteus    Exp Date: 02/2013    Lot #: [id number]    Mfr: [email or web address]    Comments: testosterone 100 mg/ ml   2 cc given IM. no charge for medication . patient brings in his own medication    Patient tolerated injection without complications    Given by: Theresia Lo RN (October 28, 2010 2:05 PM)  Orders Added: 1)  Admin of Injection (IM/SQ) [phone]

## 2010-11-10 DIAGNOSIS — E291 Testicular hypofunction: Secondary | ICD-10-CM

## 2010-11-11 ENCOUNTER — Ambulatory Visit (INDEPENDENT_AMBULATORY_CARE_PROVIDER_SITE_OTHER): Payer: Medicare Other

## 2010-11-11 ENCOUNTER — Encounter: Payer: Self-pay | Admitting: *Deleted

## 2010-11-20 NOTE — Assessment & Plan Note (Signed)
Summary: shot  Nurse Visit   Allergies: 1)  ! Pcn  Medication Administration  Injection # 1:    Medication: Testosterone Cypionat 200mg  inj    Diagnosis: TESTOSTERONE DEFICIENCY (ICD-257.2)    Route: IM    Site: L thigh    Exp Date: 07/2013    Lot #: [id number]    Mfr: [email or web address]    Comments:  no charge for medication , patient brings in his own medication Ttestosterone Cypionate  100 mg / ml ,  2 ml given    Patient tolerated injection without complications    Given by: Theresia Lo RN (November 11, 2010 11:30 AM)  Orders Added: 1)  Admin of Injection (IM/SQ) [04540]   Medication Administration  Injection # 1:    Medication: Testosterone Cypionat 200mg  inj    Diagnosis: TESTOSTERONE DEFICIENCY (ICD-257.2)    Route: IM    Site: L thigh    Exp Date: 07/2013    Lot #: [id number]    Mfr: [email or web address]    Comments:  no charge for medication , patient brings in his own medication Ttestosterone Cypionate  100 mg / ml ,  2 ml given    Patient tolerated injection without complications    Given by: Theresia Lo RN (November 11, 2010 11:30 AM)  Orders Added: 1)  Admin of Injection (IM/SQ) [47829]

## 2010-11-25 ENCOUNTER — Ambulatory Visit (INDEPENDENT_AMBULATORY_CARE_PROVIDER_SITE_OTHER): Payer: Medicare Other | Admitting: *Deleted

## 2010-11-25 DIAGNOSIS — E291 Testicular hypofunction: Secondary | ICD-10-CM

## 2010-11-25 MED ORDER — TESTOSTERONE CYPIONATE 100 MG/ML IM SOLN
200.0000 mg | Freq: Once | INTRAMUSCULAR | Status: AC
  Administered 2010-11-25: 200 mg via INTRAMUSCULAR

## 2010-11-26 ENCOUNTER — Telehealth: Payer: Self-pay | Admitting: *Deleted

## 2010-11-26 NOTE — Telephone Encounter (Signed)
Request refill on percocet and fentanyl patch . Would like to pick up 11/28/2010.

## 2010-11-26 NOTE — Telephone Encounter (Signed)
Dr. Alvester Morin hand wrote Rx for percocet and fentanyl and gave to RN. Placed in file in front office .  Message left on voicemail that RX are ready for pick up.

## 2010-12-09 ENCOUNTER — Ambulatory Visit: Payer: Medicare Other

## 2010-12-17 ENCOUNTER — Ambulatory Visit (INDEPENDENT_AMBULATORY_CARE_PROVIDER_SITE_OTHER): Payer: Medicare Other | Admitting: *Deleted

## 2010-12-17 VITALS — BP 147/100 | HR 70 | Wt 243.4 lb

## 2010-12-17 DIAGNOSIS — E349 Endocrine disorder, unspecified: Secondary | ICD-10-CM

## 2010-12-17 DIAGNOSIS — E291 Testicular hypofunction: Secondary | ICD-10-CM

## 2010-12-17 MED ORDER — TESTOSTERONE CYPIONATE 200 MG/ML IM SOLN
200.0000 mg | INTRAMUSCULAR | Status: DC
  Administered 2010-12-17: 200 mg via INTRAMUSCULAR

## 2010-12-17 NOTE — Progress Notes (Signed)
Patient in office for testosterone injection. Requested BP check . BP 147/100 pulse 70 weight 243.4.  Has appointment next week with PCP for CPE.  Consulted with Dr. [place] and she advises to have patient follow up with MD at CPE about BP.

## 2010-12-26 ENCOUNTER — Ambulatory Visit
Admission: RE | Admit: 2010-12-26 | Discharge: 2010-12-26 | Disposition: A | Discharge status: Home / Self Care | Payer: Medicare Other | Source: Ambulatory Visit | Attending: Family Medicine | Admitting: Family Medicine

## 2010-12-26 ENCOUNTER — Ambulatory Visit (INDEPENDENT_AMBULATORY_CARE_PROVIDER_SITE_OTHER): Payer: Medicare Other | Admitting: Family Medicine

## 2010-12-26 DIAGNOSIS — R0600 Dyspnea, unspecified: Secondary | ICD-10-CM

## 2010-12-26 DIAGNOSIS — E781 Pure hyperglyceridemia: Secondary | ICD-10-CM

## 2010-12-26 DIAGNOSIS — F17200 Nicotine dependence, unspecified, uncomplicated: Secondary | ICD-10-CM

## 2010-12-26 DIAGNOSIS — F329 Major depressive disorder, single episode, unspecified: Secondary | ICD-10-CM

## 2010-12-26 DIAGNOSIS — IMO0002 Reserved for concepts with insufficient information to code with codable children: Secondary | ICD-10-CM

## 2010-12-26 DIAGNOSIS — E291 Testicular hypofunction: Secondary | ICD-10-CM

## 2010-12-26 DIAGNOSIS — R0609 Other forms of dyspnea: Secondary | ICD-10-CM

## 2010-12-26 DIAGNOSIS — E785 Hyperlipidemia, unspecified: Secondary | ICD-10-CM

## 2010-12-26 DIAGNOSIS — M792 Neuralgia and neuritis, unspecified: Secondary | ICD-10-CM

## 2010-12-26 DIAGNOSIS — G4700 Insomnia, unspecified: Secondary | ICD-10-CM

## 2010-12-26 DIAGNOSIS — E349 Endocrine disorder, unspecified: Secondary | ICD-10-CM

## 2010-12-26 DIAGNOSIS — M009 Pyogenic arthritis, unspecified: Secondary | ICD-10-CM

## 2010-12-26 DIAGNOSIS — G8929 Other chronic pain: Secondary | ICD-10-CM

## 2010-12-26 LAB — COMPREHENSIVE METABOLIC PANEL
Albumin: 4.2 g/dL (ref 3.5–5.2)
Alkaline Phosphatase: 64 U/L (ref 39–117)
CO2: 30 mEq/L (ref 19–32)
Glucose, Bld: 103 mg/dL — ABNORMAL HIGH (ref 70–99)
Potassium: 4.4 mEq/L (ref 3.5–5.3)
Sodium: 143 mEq/L (ref 135–145)
Total Protein: 6.8 g/dL (ref 6.0–8.3)

## 2010-12-26 LAB — CBC
HCT: 44.8 % (ref 39.0–52.0)
Hemoglobin: 14.8 g/dL (ref 13.0–17.0)
WBC: 8.3 10*3/uL (ref 4.0–10.5)

## 2010-12-26 LAB — LIPID PANEL
LDL Cholesterol: 98 mg/dL (ref 0–99)
Triglycerides: 82 mg/dL (ref <150)

## 2010-12-26 MED ORDER — OXYCODONE-ACETAMINOPHEN 10-650 MG PO TABS: 1.0000 | ORAL_TABLET | Freq: Four times a day (QID) | ORAL | Status: DC | PRN

## 2010-12-26 MED ORDER — GABAPENTIN 300 MG PO CAPS: ORAL_CAPSULE | ORAL | Status: DC

## 2010-12-26 MED ORDER — FENTANYL 25 MCG/HR TD PT72: 1.0000 | MEDICATED_PATCH | TRANSDERMAL | Status: DC

## 2010-12-26 NOTE — Patient Instructions (Signed)
Dyspnea (Shortness of Breath) Dyspnea is when a person has trouble breathing. This needs care right away. HOME CARE  Do not smoke. Smoking is a common cause of this problem. If you smoke, ask your doctor for help to quit.   Stay away from chemicals that may bother your breathing (paint fumes, dust).   Rest as needed. Start usual activities slowly.   Take all medicines as told by your doctor. This includes oxygen and any medicines you breathe in.   Follow up with your doctor. This is important. If you do not follow up, the condition could get worse.  GET HELP IF:  The condition does not improve.   You or your child has a hard time doing normal activities, even with rest.   You or your child has problems with medicines.   You or your child gets any new problems other than shortness of breath.   You or your child has a temperature by mouth above 100.5.   Your baby is older than 3 months with a rectal temperature of 100.5 F (38.1 C) or higher for more than 1 day.  GET HELP RIGHT AWAY IF:  The dyspnea or other problems get worse.   You or your child feels lightheaded, faint, or gets a cough that is not controlled with medicine.   You or your child coughs up blood.   You or your child has painful breathing.   There is chest, arm, shoulder, or belly (abdominal) pain.   You or your child has a temperature by mouth above 100.5, not controlled by medicine.   Your baby is older than 3 months with a rectal temperature of 102 F (38.9 C) or higher.   Your baby is 54 months old or younger with a rectal temperature of 100.4 F (38 C) or higher.   You or your child cannot walk up stairs or exercise normally.  MAKE SURE YOU:   Understand these instructions.   Will watch this condition.   Will get help right away if you or your child is not doing well or gets worse.  Document Released: 03/09/2008 Document Re-Released: 12/16/2009 [facility] Patient Information 2011 [place],  [place].Pain, Neuropathic, Painful Neuropathy We often think that pain has a physical cause. If we get rid of the cause, the pain should go away. Nerves themselves can also cause pain. It is called neuropathic pain, which means nerve abnormality. It may be difficult for the patients who have it and for the treating caregivers. Pain is usually described as acute (short lived) or chronic (long lasting). Acute pain is related to the physical sensations caused by an injury. It can last from a few seconds to many weeks, but it usually goes away when normal healing occurs. Chronic pain lasts beyond the typical healing time. With neuropathic pain, the nerve fibers themselves may be damaged or injured. They then send incorrect signals to other pain centers. The pain you feel is real, but the cause is not easy to find.  CAUSES Chronic pain can result from diseases, such as diabetes and shingles (an infection related to chickenpox), or from trauma, surgery, or amputation. It can also happen without any known injury or disease. The nerves are sending pain messages, even though there is no identifiable cause for such messages.   Other common causes of neuropathy include diabetes, phantom limb pain, or Regional Pain Syndrome (RPS).   As with all forms of chronic back pain, if neuropathy is not correctly treated, there can be  a number of associated problems that lead to a downward cycle for the patient. These include depression, sleeplessness, feelings of fear and anxiety, limited social interaction and inability to do normal daily activities or work.   The most dramatic and mysterious example of neuropathic pain is called "phantom limb syndrome." This occurs when an arm or a leg has been removed because of illness or injury. The brain still gets pain messages from the nerves that originally carried impulses from the missing limb. These nerves now seem to misfire and cause troubling pain.   Neuropathic pain often seems to  have no cause. It responds poorly to standard pain treatment and may get worse instead of better over time. For some people, it can lead to serious disability.  Neuropathic pain can occur after:  Shingles (Herpes Zoster Virus Infection)  A lasting burning sensation of the skin, caused usually by injury to a peripheral nerve   Peripheral Neuropathy which is widespread nerve damage, often caused by diabetes or alcoholism.   Phantom Limb Pain following an amputation.   Facial Nerve Problems (Trigeminal Neuralgia).   Multiple Sclerosis   Reflex Sympathetic Dystrophy  Pain which comes with cancer and cancer chemotherapy.   Entrapment Neuropathy such as when pressure is put on a nerve such as in carpal tunnel syndrome.   Back, leg, and hip problems (Sciatica)   Spine or back surgery   HIV Infection or AIDS where nerves are infected by viruses.   Your caregiver can explain items in the above list which may apply to you. SYMPTOMS Back pain or other pain that is caused by neuropathy is typically described as:  Severe, sharp, electric shock-like, or shooting.   Deep burning or cold   Persistent numbness, tingling, or weakness.   Traveling along the nerve path into the arms, hands, legs or feet.   Further, neuropathy may be characterized by pain resulting from light touch or other stimulus that would not usually cause pain. There may also be increased sensitivity to something that would normally cause pain (a pinprick).   Lasting pain from a non-painful thing such as a light touch is common. Pain may persist for months or years following the healing of damaged tissues. When this happens, pain signals no longer sound an alarm about current injuries or injuries about to happen. Instead, the alarm system itself is not working correctly.  DIAGNOSIS  When you have a pain with no known cause, your caregiver will probably ask some specific questions:   Do you have any other conditions, such  as diabetes, shingles, multiple sclerosis, or HIV infection?   How would you describe your pain? (Neuropathic pain is often described as shooting, stabbing, burning, or searing.)   Is your pain worse at any time of the day? (Neuropathic pain is usually worse at night.)   Does the pain seem to follow a certain physical pathways?   Does the pain come from an area that has missing or injured nerves? An example would be phantom limb pain.   Is the pain triggered by minor things such as rubbing against the sheets at night?  These questions often help define the type of pain involved. Once your caregiver knows what is happening, treatment can begin. Anticonvulsant, antidepressant drugs, and various pain relievers seem to work in some cases. If another condition, such as diabetes is involved, better management of that disorder may relieve the neuropathic pain.  TREATMENT Neuropathic pain is frequently long lasting, and tends not to respond  to treatment with narcotic type of pain medication. It may respond well to other drugs such as anti-seizure and anti-depressant medications. Usually, neuropathic problems do not completely go away, but partial improvement is often possible with proper treatment. Your caregivers have large numbers of medications available to treat you. Do not be discouraged if you do not get immediate relief. Sometimes different medications or a combination of medications will be tried before you receive the results you are hoping for. Be sure you see your caregiver if you have pain that seems to be coming from nowhere and does not go away. Help is available.  Document Released: 06/18/2004 Document Re-Released: 03/11/2010 [facility] Patient Information 2011 [place], [place].

## 2010-12-28 ENCOUNTER — Encounter: Payer: Self-pay | Admitting: Family Medicine

## 2010-12-28 DIAGNOSIS — M009 Pyogenic arthritis, unspecified: Secondary | ICD-10-CM | POA: Insufficient documentation

## 2010-12-28 NOTE — Assessment & Plan Note (Signed)
Stable on celexa 60. Will continue to follow.

## 2010-12-28 NOTE — Assessment & Plan Note (Signed)
encouraged cessation. Will readress at next visit. May consider wellbutrin.

## 2010-12-28 NOTE — Assessment & Plan Note (Signed)
  Will check CMET and FLP. Currently on omega 3 supplements. Discussed diet and exercise. Will readress pending lab results.

## 2010-12-28 NOTE — Assessment & Plan Note (Addendum)
Currently stable. Has never asked for an early refill. Will increase neurontin Portion of medication regimen to help with intermittent insomnia and help as bridge to narcotics. Gave narcotic refills today. Pt agreeable to plan.

## 2010-12-28 NOTE — Progress Notes (Signed)
  Subjective:    Patient ID: Jeffrey [name], male    DOB: [birth year], 50 y.o.   MRN: [id number]  HPI 36 YOM here for annual exam. Pt denies any acute issues apart from dyspnea:  Dyspnea: Pt reports intermittent dyspnea since last clinic visit. No CP, orthopnea, per pt. Dyspnea sometimes ass'd with exertion. No fevers, rashes. Recent URI 3-4 weeks ago per pt.   Tobacco Abuse: Pt still smoking up to 1/2 PPD. Has used chantix in the past and feels that it was ineffective. Does not currently desire quitting.   Mood: Currently stable per pt . No HI/SI. Currently taking celexa 60 mg daily.   HTN: Not checking blood pressures at home per pt. No HA, CP, dyspnea, abd pain per pt. Currently on norvasc (HCTZ previously d/c'd secondary to hypercalcemia)  Chronic Pain: Pt on fentanyl 25 and percocet 10/650 in setting of hx/o avascular necrosis of femoral head, chronic back pain. Pt has been on this regimen for > 1 year. Feels that pain has overall well controlled, but does have issues with pain at night sometimes.   Testosterone Deficiency: Fatigue and energy overall improved per pt. On biweekly testosterone injections. Has been tolerating well per pt.   BPH : Stable on doxazosin.   Doxycycline: Taking prophylactic dose in setting of septic hip associated with avascular necrosis.   Review of Systems     Objective:   Physical Exam Gen: up in chair, NAD HEENT: NCAT, EOMI, TMs clear bilaterally CV: RRR, no murmurs auscultated PULM: CTAB, +faint coarse breath sounds, no wheezes ABD: S/NT/+ bowel sounds  EXT: 2+ peripheral pulses         Assessment & Plan:  Dyspnea- Will obtain CXR to rule post URI PNA, though unlikely. Will also check for any COPD type changes in setting of longstanding smoking history (>30 pack year smoking history) Tobacco-encouraged cessation. Will readress at next visit. May consider wellbutrin.  Mood: Stable on celexa 60. Chronic Pain- Currently stable. Has never asked  for an early refill. Will increase neurontin  Portion of medication regimen to help with intermittent insomnia and help as bridge to narcotics. Pt agreeable to plan.  Testosterone level- Stable. Will check a testosterone level today.  BPH. Stable. Continue doxazosin.  HLD: Will check CMET and FLP. Currently on omega 3 supplements. Discussed diet and exercise. Will readress pending lab results.

## 2010-12-28 NOTE — Assessment & Plan Note (Signed)
Stable. Will check a testosterone level today.

## 2010-12-28 NOTE — Assessment & Plan Note (Signed)
Stable. Hopefully neurontin portion of pain regimen will also buffer this. Would try to avoid ambien in this patient.

## 2011-01-02 ENCOUNTER — Ambulatory Visit (INDEPENDENT_AMBULATORY_CARE_PROVIDER_SITE_OTHER): Payer: Medicare Other | Admitting: *Deleted

## 2011-01-02 DIAGNOSIS — E291 Testicular hypofunction: Secondary | ICD-10-CM

## 2011-01-02 DIAGNOSIS — E349 Endocrine disorder, unspecified: Secondary | ICD-10-CM

## 2011-01-02 MED ORDER — TESTOSTERONE CYPIONATE 100 MG/ML IM SOLN
200.0000 mg | INTRAMUSCULAR | Status: DC
  Administered 2011-01-02: 200 mg via INTRAMUSCULAR

## 2011-01-08 LAB — UIFE/LIGHT CHAINS/TP QN, 24-HR UR
Alpha 1, Urine: DETECTED — AB
Alpha 2, Urine: DETECTED — AB
Beta, Urine: DETECTED — AB
Free Kappa Lt Chains,Ur: 9.5 mg/dL — ABNORMAL HIGH (ref 0.04–1.51)
Gamma Globulin, Urine: DETECTED — AB

## 2011-01-08 LAB — ANTISTREPTOLYSIN O TITER: ASO: 67 IU/mL (ref 0–116)

## 2011-01-08 LAB — URINE MICROSCOPIC-ADD ON

## 2011-01-08 LAB — RENAL FUNCTION PANEL
Albumin: 3.4 g/dL — ABNORMAL LOW (ref 3.5–5.2)
Albumin: 3.4 g/dL — ABNORMAL LOW (ref 3.5–5.2)
CO2: 23 mEq/L (ref 19–32)
Calcium: 10.2 mg/dL (ref 8.4–10.5)
Chloride: 107 mEq/L (ref 96–112)
Creatinine, Ser: 3.14 mg/dL — ABNORMAL HIGH (ref 0.4–1.5)
GFR calc Af Amer: 19 mL/min — ABNORMAL LOW (ref >60)
GFR calc Af Amer: 26 mL/min — ABNORMAL LOW (ref >60)
GFR calc non Af Amer: 16 mL/min — ABNORMAL LOW (ref >60)
GFR calc non Af Amer: 21 mL/min — ABNORMAL LOW (ref >60)
GFR calc non Af Amer: 26 mL/min — ABNORMAL LOW (ref >60)
Glucose, Bld: 96 mg/dL (ref 70–99)
Phosphorus: 3.2 mg/dL (ref 2.3–4.6)
Phosphorus: 5.4 mg/dL — ABNORMAL HIGH (ref 2.3–4.6)
Potassium: 4 mEq/L (ref 3.5–5.1)
Potassium: 4.2 mEq/L (ref 3.5–5.1)
Potassium: 4.3 mEq/L (ref 3.5–5.1)
Sodium: 139 mEq/L (ref 135–145)
Sodium: 140 mEq/L (ref 135–145)

## 2011-01-08 LAB — BASIC METABOLIC PANEL
GFR calc Af Amer: 18 mL/min — ABNORMAL LOW (ref >60)
GFR calc non Af Amer: 15 mL/min — ABNORMAL LOW (ref >60)
Potassium: 4.2 mEq/L (ref 3.5–5.1)
Sodium: 139 mEq/L (ref 135–145)

## 2011-01-08 LAB — CBC
HCT: 34.8 % — ABNORMAL LOW (ref 39.0–52.0)
Hemoglobin: 11.7 g/dL — ABNORMAL LOW (ref 13.0–17.0)
Hemoglobin: 12.2 g/dL — ABNORMAL LOW (ref 13.0–17.0)
MCV: 102.2 fL — ABNORMAL HIGH (ref 78.0–100.0)
Platelets: 285 10*3/uL (ref 150–400)
RBC: 3.14 MIL/uL — ABNORMAL LOW (ref 4.22–5.81)
RDW: 13.1 % (ref 11.5–15.5)
WBC: 8.3 10*3/uL (ref 4.0–10.5)
WBC: 9.3 10*3/uL (ref 4.0–10.5)

## 2011-01-08 LAB — PROTEIN ELECTROPHORESIS, SERUM
Gamma Globulin: 15.7 % (ref 11.1–18.8)
M-Spike, %: NOT DETECTED g/dL

## 2011-01-08 LAB — URINE CULTURE
Colony Count: NO GROWTH
Culture: NO GROWTH

## 2011-01-08 LAB — CREATININE, URINE, RANDOM: Creatinine, Urine: 62.6 mg/dL

## 2011-01-08 LAB — URINALYSIS, ROUTINE W REFLEX MICROSCOPIC
Glucose, UA: NEGATIVE mg/dL
Ketones, ur: NEGATIVE mg/dL
Protein, ur: NEGATIVE mg/dL
Urobilinogen, UA: 0.2 mg/dL (ref 0.0–1.0)

## 2011-01-08 LAB — TESTOSTERONE, FREE: Testosterone, Free: 147.4 pg/mL (ref 47.0–244.0)

## 2011-01-08 LAB — TESTOSTERONE: Testosterone: 443.38 ng/dL (ref 350–890)

## 2011-01-08 LAB — GLUCOSE, CAPILLARY

## 2011-01-08 LAB — ANA: Anti Nuclear Antibody(ANA): NEGATIVE

## 2011-01-08 LAB — FOLATE RBC: RBC Folate: 258 ng/mL (ref 180–600)

## 2011-01-12 ENCOUNTER — Ambulatory Visit (INDEPENDENT_AMBULATORY_CARE_PROVIDER_SITE_OTHER): Payer: Medicare Other | Admitting: Family Medicine

## 2011-01-12 ENCOUNTER — Encounter: Payer: Self-pay | Admitting: Family Medicine

## 2011-01-12 DIAGNOSIS — R5383 Other fatigue: Secondary | ICD-10-CM

## 2011-01-12 DIAGNOSIS — E291 Testicular hypofunction: Secondary | ICD-10-CM

## 2011-01-12 DIAGNOSIS — R5381 Other malaise: Secondary | ICD-10-CM

## 2011-01-12 LAB — TSH: TSH: 1.652 u[IU]/mL (ref 0.350–4.500)

## 2011-01-12 MED ORDER — TESTOSTERONE CYPIONATE 200 MG/ML IM SOLN: 300.0000 mg | INTRAMUSCULAR | Status: DC

## 2011-01-12 NOTE — Patient Instructions (Addendum)
Sleep Apnea     Sleep apnea is a common disorder. The main problem of this disorder is excessive daytime sleepiness and compromised quality of life. This includes social and emotional problems. There are two types of sleep apnea.    Obstructive sleep apnea is when breathing stops due to a blocked airway.        Central sleep apnea is a malfunction of the brain's normal signal to breathe.      SYMPTOMS OF SLEEP APNEA MAY INCLUDE:    Restless sleep.    Falling asleep while driving and/or during the day.   Loss of energy.   Irritability.   Mood or behavior changes.  Loud, heavy snoring.    Morning headaches.   Trouble concentrating.   Forgetfulness.   Anxiety or depression.   Decreased interest in sex.      Not all people with sleep apnea have all of these symptoms. However, people who have a few of these symptoms should visit their caregiver for an evaluation. Problems related to untreated sleep apnea include:   High blood pressure (hypertension).    Coronary artery disease.   Impotence.  Cognitive dysfunction.    Memory loss.       TREATMENT   For mild cases, treatment may include avoiding sleeping on one's back.    For people with nasal congestion, a decongestant may be prescribed.    Patients with obstructive and central apnea should avoid depressants. This includes alcohol, sedatives and narcotics. Weight loss and diet control are encouraged for overweight patients.    Many serious cases of obstructive sleep apnea can be relieved by a treatment called nasal continuous positive airway pressure (nasal CPAP). Nasal CPAP uses a mask-like device and pump that work together to keep the airway open. The pump delivers air pressure during each breath. Surgery may help some patients by stopping or reducing the narrowing of the airway due to anatomical defects.     PROGNOSIS   Removing the obstruction usually reverses hypertension and cardiac problems. Untreated, sleep apnea sufferers have a tendency to fall asleep during the day. This is can result in serious accident or loss of ones job.     RESEARCH BEING DONE  Sleep apnea is currently one of the most active areas of sleep research.      Document Released: 09/11/2002  Document Re-Released: 06/30/2008  [employer] Patient Information 2011 [employer].

## 2011-01-13 ENCOUNTER — Encounter: Payer: Self-pay | Admitting: Family Medicine

## 2011-01-13 DIAGNOSIS — R5383 Other fatigue: Secondary | ICD-10-CM | POA: Insufficient documentation

## 2011-01-13 NOTE — Assessment & Plan Note (Signed)
Will check TSH. Depression is being adequately treated per pt. Still am concerned about sleep apnea component of presentation given persistence of daytime somnolence. Will reorder split night in hopes this can be appropriately performed.

## 2011-01-13 NOTE — Progress Notes (Signed)
  Subjective:    Patient ID: Jeffrey [name], male    DOB: 15-May-1961, 50 y.o.   MRN: [id number]  HPI Pt here for follow up on testosterone level Pt with noted testosterone level 234.56  (Male Tanner stage 5 limits [id number]).   Fatigue- Pt does report persistence of fatigue. No depressive sxs per pt. Mood overall stable.  + Daytime somnolence and persistent decreased energy. No weight gain. + Family hx/o sleep apnea. Sleep study previously attempted, but was unable to complete secondary to insurance costs.     Review of Systems     Objective:   Physical Exam Gen: up in chair, NAD HEENT: NCAT, EOMI, TMs clear bilaterally CV: RRR, no murmurs auscultated PULM: CTAB, no wheezes, rales, rhoncii ABD: S/NT/+ bowel sounds  EXT: 2+ peripheral pulses      Assessment & Plan:  Testosterone Deficiency- Will increase testosterone to 300mg  IM q 2 weeks. Will recheck testosterone level in 3-6 months.    Fatigue- Will check TSH. Depression is being adequately treated per pt. Still am concerned about sleep apnea component of presentation given persistence of daytime somnolence. Will reorder split night in hopes this can be appropriately performed.

## 2011-01-13 NOTE — Assessment & Plan Note (Signed)
Will increase testosterone to 300mg  IM q 2 weeks. Will recheck testosterone level in 3-6 months.

## 2011-01-16 ENCOUNTER — Telehealth: Payer: Self-pay | Admitting: Family Medicine

## 2011-01-16 NOTE — Telephone Encounter (Signed)
Called to say that medicare approved Testosterone for 1 year.

## 2011-01-19 ENCOUNTER — Ambulatory Visit (INDEPENDENT_AMBULATORY_CARE_PROVIDER_SITE_OTHER): Payer: Medicare Other | Admitting: *Deleted

## 2011-01-19 DIAGNOSIS — E291 Testicular hypofunction: Secondary | ICD-10-CM

## 2011-01-19 DIAGNOSIS — E349 Endocrine disorder, unspecified: Secondary | ICD-10-CM

## 2011-01-19 MED ORDER — TESTOSTERONE CYPIONATE 200 MG/ML IM SOLN
300.0000 mg | INTRAMUSCULAR | Status: DC
  Administered 2011-01-19 – 2011-03-03 (×3): 300 mg via INTRAMUSCULAR

## 2011-01-26 ENCOUNTER — Telehealth: Payer: Self-pay | Admitting: Family Medicine

## 2011-01-26 NOTE — Telephone Encounter (Signed)
Pt asking for refill on pain medication, runs out today. Pt has an appt on 4/25.

## 2011-01-27 ENCOUNTER — Other Ambulatory Visit: Payer: Self-pay | Admitting: Family Medicine

## 2011-01-27 DIAGNOSIS — G8929 Other chronic pain: Secondary | ICD-10-CM

## 2011-01-27 MED ORDER — OXYCODONE-ACETAMINOPHEN 10-650 MG PO TABS: 1.0000 | ORAL_TABLET | Freq: Four times a day (QID) | ORAL | Status: DC | PRN

## 2011-01-27 MED ORDER — FENTANYL 25 MCG/HR TD PT72: 1.0000 | MEDICATED_PATCH | TRANSDERMAL | Status: DC

## 2011-01-27 NOTE — Telephone Encounter (Signed)
Pt calling back to ask when rx will be ready.  Will have to return to his parent's home today, but wanted to have rx before leaving.  Please call asap to let him know when ready.

## 2011-01-27 NOTE — Telephone Encounter (Signed)
Called and left VM for pt that medications are ready for pick-up. Doree Albee MD

## 2011-01-28 ENCOUNTER — Ambulatory Visit (INDEPENDENT_AMBULATORY_CARE_PROVIDER_SITE_OTHER): Payer: Medicare Other | Admitting: Family Medicine

## 2011-01-28 ENCOUNTER — Encounter: Payer: Self-pay | Admitting: Family Medicine

## 2011-01-28 VITALS — BP 131/81 | HR 81 | Ht 71.0 in | Wt 240.0 lb

## 2011-01-28 DIAGNOSIS — R5381 Other malaise: Secondary | ICD-10-CM

## 2011-01-28 DIAGNOSIS — R5383 Other fatigue: Secondary | ICD-10-CM

## 2011-01-28 DIAGNOSIS — F17200 Nicotine dependence, unspecified, uncomplicated: Secondary | ICD-10-CM

## 2011-01-28 DIAGNOSIS — F32A Depression, unspecified: Secondary | ICD-10-CM

## 2011-01-28 DIAGNOSIS — F3289 Other specified depressive episodes: Secondary | ICD-10-CM

## 2011-01-28 DIAGNOSIS — F329 Major depressive disorder, single episode, unspecified: Secondary | ICD-10-CM

## 2011-01-28 MED ORDER — BUPROPION HCL ER (SR) 150 MG PO TB12: 150.0000 mg | ORAL_TABLET | Freq: Two times a day (BID) | ORAL | Status: DC

## 2011-01-28 NOTE — Assessment & Plan Note (Signed)
Multifactorial. Likely contribution of testosterone deficiency, baseline depression, ?sleep apnea. TSH WNL at most recent visit. Discussed with pt that mood/fatigue may have some improvement over time as testosterone level normalizes. Readressed importance of sleep study. Will start pt on wellbutrin for concominant adjunctive antidepressant tx as well as for smoking cessation. Will followup in 1 month to reassess sxs.

## 2011-01-28 NOTE — Progress Notes (Signed)
  Subjective:    Patient ID: Jeffrey [name], male    DOB: 17-May-1961, 50 y.o.   MRN: [id number]  HPI Pt here for follow up on mood/fatigue.  Fatigue- Overall stable since increase in testosterone. Has had some depressed mood per pt. No HI/SI. Feels that celexa is no longer effective in mood management. Pt denies any recent stressful events that exacerbated mood. Still with intermittent daytime somnolence. Still pending sleep study per pt.  Smoking- Pt desires to quit smoking. Previously tried on wellbutrin. Pt took for approx 3 weeks before stopping use.   Review of Systems See HPI     Objective:   Physical Exam Gen: in chair, NAD CV: RRR, no murmurs auscultated  PULM: CTAB, no wheezes, rales, rhoncii  ABD: S/NT/+ bowel sounds  EXT: 2+ peripheral pulses Psych-appropriate insight and judgment, minimally dysthymic    Assessment & Plan:  Mood- Multifactorial. Likely contribution of testosterone deficiency, baseline depression, ?sleep apnea. TSH WNL at most recent visit. Discussed with pt that mood/fatigue may have some improvement over time as testosterone level normalizes. Readressed importance of sleep study. Will start pt on wellbutrin for concominant adjunctive antidepressant tx as well as for smoking cessation. Will followup in 1 month to reassess sxs.

## 2011-01-28 NOTE — Patient Instructions (Signed)
Smoking Cessation This document explains the best ways for you to quit smoking and new treatments to help. It lists new medicines that can double or triple your chances of quitting and quitting for good. It also considers ways to avoid relapses and concerns you may have about quitting, including weight gain. NICOTINE: A POWERFUL ADDICTION If you have tried to quit smoking, you know how hard it can be. It is hard because nicotine is a very addictive drug. For some people, it can be as addictive as heroin or cocaine. Usually, people make 2 or 3 tries, or more, before finally being able to quit. Each time you try to quit, you can learn about what helps and what hurts. Quitting takes hard work and a lot of effort, but you can quit smoking. QUITTING SMOKING IS ONE OF THE MOST IMPORTANT THINGS YOU WILL EVER DO:  You will live longer, feel better, and live better.   The impact on your body of quitting smoking is felt almost immediately:   Within 20 minutes, blood pressure decreases. Pulse returns to its normal level.   After 8 hours, carbon monoxide levels in the blood return to normal. Oxygen level increases.   After 24 hours, chance of heart attack starts to decrease. Breath, hair, and body stop smelling like smoke.   After 48 hours, damaged nerve endings begin to recover. Sense of taste and smell improve.   After 72 hours, the body is virtually free of nicotine. Bronchial tubes relax and breathing becomes easier.   After 2 to 12 weeks, lungs can hold more air. Exercise becomes easier and circulation improves.   Quitting will lower your chance of having a heart attack, stroke, cancer, or lung disease:   After 1 year, the risk of coronary heart disease is cut in half.   After 5 years, the risk of stroke falls to the same as a nonsmoker.   After 10 years, the risk of lung cancer is cut in half and the risk of other cancers decreases significantly.   After 15 years, the risk of coronary heart  disease drops, usually to the level of a nonsmoker.   If you are pregnant, quitting smoking will improve your chances of having a healthy baby.   The people you live with, especially your children, will be healthier.   You will have extra money to spend on things other than cigarettes.  FIVE KEYS TO QUITTING Studies have shown that these 5 steps will help you quit smoking and quit for good. You have the best chances of quitting if you use them together: 1. Get ready.  2. Get support and encouragement.  3. Learn new skills and behaviors.  4. Get medicine to reduce your nicotine addiction and use it correctly.  5. Be prepared for relapse or difficult situations. Be determined to continue trying to quit, even if you do not succeed at first.  1. GET READY  Set a quit date.   Change your environment.   Get rid of ALL cigarettes, ashtrays, matches, and lighters in your home, car, and place of work.   Do not let people smoke in your home.   Review your past attempts to quit. Think about what worked and what did not.   Once you quit, do not smoke. NOT EVEN A PUFF!  2. GET SUPPORT AND ENCOURAGEMENT Studies have shown that you have a better chance of being successful if you have help. You can get support in many ways.  Tell   your family, friends, and coworkers that you are going to quit and need their support. Ask them not to smoke around you.   Talk to your caregivers (doctor, dentist, nurse, pharmacist, psychologist, and/or smoking counselor).   Get individual, group, or telephone counseling and support. The more counseling you have, the better your chances are of quitting. Programs are available at local hospitals and health centers. Call your local health department for information about programs in your area.   Spiritual beliefs and practices may help some smokers quit.   Quit meters are small computer programs online or downloadable that keep track of quit statistics, such as amount  of "quit-time," cigarettes not smoked, and money saved.   Many smokers find one or more of the many self-help books available useful in helping them quit and stay off tobacco.  3. LEARN NEW SKILLS AND BEHAVIORS  Try to distract yourself from urges to smoke. Talk to someone, go for a walk, or occupy your time with a task.   When you first try to quit, change your routine. Take a different route to work. Drink tea instead of coffee. Eat breakfast in a different place.   Do something to reduce your stress. Take a hot bath, exercise, or read a book.   Plan something enjoyable to do every day. Reward yourself for not smoking.   Explore interactive web-based programs that specialize in helping you quit.  4. GET MEDICINE AND USE IT CORRECTLY Medicines can help you stop smoking and decrease the urge to smoke. Combining medicine with the above behavioral methods and support can quadruple your chances of successfully quitting smoking. The [place] Food and Drug Administration (FDA) has approved 7 medicines to help you quit smoking. These medicines fall into 3 categories.  Nicotine replacement therapy (delivers nicotine to your body without the negative effects and risks of smoking):   Nicotine gum: Available over-the-counter.   Nicotine lozenges: Available over-the-counter.   Nicotine inhaler: Available by prescription.   Nicotine nasal spray: Available by prescription.   Nicotine skin patches (transdermal): Available by prescription and over-the-counter.   Antidepressant medicine (helps people abstain from smoking, but how this works is unknown):   Bupropion sustained-release (SR) tablets: Available by prescription.   Nicotinic receptor partial agonist (simulates the effect of nicotine in your brain):   Varenicline tartrate tablets: Available by prescription.   Ask your caregiver for advice about which medicines to use and how to use them. Carefully read the information on the package.    Everyone who is trying to quit may benefit from using a medicine. If you are pregnant or trying to become pregnant, nursing an infant, you are under age 18, or you smoke fewer than 10 cigarettes per day, talk to your caregiver before taking any nicotine replacement medicines.   You should stop using a nicotine replacement product and call your caregiver if you experience nausea, dizziness, weakness, vomiting, fast or irregular heartbeat, mouth problems with the lozenge or gum, or redness or swelling of the skin around the patch that does not go away.   Do not use any other product containing nicotine while using a nicotine replacement product.   Talk to your caregiver before using these products if you have diabetes, heart disease, asthma, stomach ulcers, you had a recent heart attack, you have high blood pressure that is not controlled with medicine, a history of irregular heartbeat, or you have been prescribed medicine to help you quit smoking.  5. BE PREPARED FOR RELAPSE OR   DIFFICULT SITUATIONS  Most relapses occur within the first 3 months after quitting. Do not be discouraged if you start smoking again. Remember, most people try several times before they finally quit.   You may have symptoms of withdrawal because your body is used to nicotine. You may crave cigarettes, be irritable, feel very hungry, cough often, get headaches, or have difficulty concentrating.   The withdrawal symptoms are only temporary. They are strongest when you first quit, but they will go away within 10 to 14 days.  Here are some difficult situations to watch for:  Alcohol. Avoid drinking alcohol. Drinking lowers your chances of successfully quitting.   Caffeine. Try to reduce the amount of caffeine you consume. It also lowers your chances of successfully quitting.   Other smokers. Being around smoking can make you want to smoke. Avoid smokers.   Weight gain. Many smokers will gain weight when they quit, usually  less than 10 pounds. Eat a healthy diet and stay active. Do not let weight gain distract you from your main goal, quitting smoking. Some medicines that help you quit smoking may also help delay weight gain. You can always lose the weight gained after you quit.   Bad mood or depression. There are a lot of ways to improve your mood other than smoking.  If you are having problems with any of these situations, talk to your caregiver. SPECIAL SITUATIONS OR CONDITIONS Studies suggest that everyone can quit smoking. Your situation or condition can give you a special reason to quit.  Pregnant women/New mothers: By quitting, you protect your baby's health and your own.   Hospitalized patients: By quitting, you reduce health problems and help healing.   Heart attack patients: By quitting, you reduce your risk of a second heart attack.   Lung, head, and neck cancer patients: By quitting, you reduce your chance of a second cancer.   Parents of children and adolescents: By quitting, you protect your children from illnesses caused by secondhand smoke.  QUESTIONS TO THINK ABOUT Think about the following questions before you try to stop smoking. You may want to talk about your answers with your caregiver.  Why do you want to quit?   If you tried to quit in the past, what helped and what did not?   What will be the most difficult situations for you after you quit? How will you plan to handle them?   Who can help you through the tough times? Your family? Friends? Caregiver?   What pleasures do you get from smoking? What ways can you still get pleasure if you quit?  Here are some questions to ask your caregiver:  How can you help me to be successful at quitting?   What medicine do you think would be best for me and how should I take it?   What should I do if I need more help?   What is smoking withdrawal like? How can I get information on withdrawal?  Quitting takes hard work and a lot of effort,  but you can quit smoking. FOR MORE INFORMATION Smokefree.gov (http://www.smokefree.gov) provides free, accurate, evidence-based information and professional assistance to help support the immediate and long-term needs of people trying to quit smoking. Document Released: 09/15/2001 Document Re-Released: 03/11/2010 [employer] Patient Information 2011 [employer]. 

## 2011-01-29 NOTE — Assessment & Plan Note (Signed)
Multifactorial. Likely contribution of testosterone deficiency, baseline depression, ?sleep apnea. TSH WNL at most recent visit. Discussed with pt that mood/fatigue may have some improvement over time as testosterone level normalizes. Readressed importance of sleep study. Will start pt on wellbutrin for concominant adjunctive antidepressant tx as well as for smoking cessation. Will followup in 1 month to reassess sxs.   

## 2011-01-29 NOTE — Assessment & Plan Note (Signed)
Started on wellbutrin to help with mood and smoking cessation.

## 2011-01-30 ENCOUNTER — Ambulatory Visit (INDEPENDENT_AMBULATORY_CARE_PROVIDER_SITE_OTHER): Payer: Medicare Other | Admitting: *Deleted

## 2011-01-30 DIAGNOSIS — E349 Endocrine disorder, unspecified: Secondary | ICD-10-CM

## 2011-01-30 DIAGNOSIS — E291 Testicular hypofunction: Secondary | ICD-10-CM

## 2011-01-30 NOTE — Progress Notes (Signed)
Patient is 3 days early for testosterone injection . Consulted with Dr. Deirdre Priest and he advises ok to give today.

## 2011-02-11 ENCOUNTER — Other Ambulatory Visit: Payer: Self-pay | Admitting: Family Medicine

## 2011-02-11 NOTE — Telephone Encounter (Signed)
Refill request

## 2011-02-11 NOTE — Telephone Encounter (Signed)
Needs refill on citalopram (CELEXA) 20 MG tablet - but wants it changed to add hydrobromide - he is now out - took one of his sisters that had the hydrobromide in it and he felt so much better. [facility]- [address]

## 2011-02-17 ENCOUNTER — Encounter: Payer: Self-pay | Admitting: Sports Medicine

## 2011-02-17 ENCOUNTER — Ambulatory Visit (INDEPENDENT_AMBULATORY_CARE_PROVIDER_SITE_OTHER): Payer: Medicare Other | Admitting: *Deleted

## 2011-02-17 DIAGNOSIS — E291 Testicular hypofunction: Secondary | ICD-10-CM

## 2011-02-17 DIAGNOSIS — E349 Endocrine disorder, unspecified: Secondary | ICD-10-CM

## 2011-02-17 MED ORDER — TESTOSTERONE CYPIONATE 200 MG/ML IM SOLN
300.0000 mg | INTRAMUSCULAR | Status: DC
  Administered 2011-02-17 – 2012-03-30 (×5): 300 mg via INTRAMUSCULAR

## 2011-02-20 ENCOUNTER — Telehealth: Payer: Self-pay | Admitting: Family Medicine

## 2011-02-20 DIAGNOSIS — G8929 Other chronic pain: Secondary | ICD-10-CM

## 2011-02-20 NOTE — H&P (Signed)
NAME:  Jeffrey, HUE                          ACCOUNT NO.:  [id number]   MEDICAL RECORD NO.:  [id number]                   PATIENT TYPE:  EMS   LOCATION:  [facility]JO                                 FACILITY:  [facility]   PHYSICIAN:  Marlan Palau, M.D.               DATE OF BIRTH:  December 31, 1960   DATE OF ADMISSION:  03/30/2003  DATE OF DISCHARGE:                                HISTORY & PHYSICAL   HISTORY OF PRESENT ILLNESS:  Daphine Deutscher. Bozman is a 50 year old right-handed  white male, born 1961/01/28, with a history of tobacco abuse and alcohol  overuse who comes to the emergency room for an evaluation of onset of right  hand and arm numbness, speech disturbance that was noted upon awakening this  morning.  Patient denies any prior history of stroke.  The patient awakened  this morning, noting that his right arm was clumsy and numb, could not talk  properly.  Patient waited around all day long for his wife to return from  work and then got ready to go to the emergency room.  The CT scan of the  brain done through the emergency room shows evidence of a left parietal  small infarct without intracranial hemorrhage; no other abnormalities are  seen.  Neurology was called for further evaluation.  Patient denies any  visual field changes, headache.   PAST MEDICAL HISTORY:  1. History of hypertension.  2. Gastroesophageal reflux disease.  3. Depression.  4. Right knee arthroscopic surgery.  5. New-onset left brain stroke, as above.   MEDICATIONS PRIOR TO ADMISSION:  1. Diovan.  2. HCTZ 25/160 mg.  3. Nexium 40 mg per day.  4. Lexapro 20 mg per day.  5. Patient was not on aspirin or Coumadin.   ALLERGIES:  Has an allergy to PENICILLIN.   SOCIAL HISTORY:  Smokes one pack of cigarettes per day, drinks alcohol on a  regular basis daily with mixed drinks.  This patient is married, has no  children, works at [employer].   FAMILY MEDICAL HISTORY:  Notable that the mother is alive with  heart  disease.  Father alive with low back pain.  Patient has two sisters, both  with hypertension.   REVIEW OF SYSTEMS:  Notable for no fevers or chills.  Patient denies any  neck pain.  Denies shortness of breath, chest pain, nausea, vomiting,  problems controlling the bowels or bladder.  Denies any blackout episodes or  dizziness.   PHYSICAL EXAMINATION:  VITAL SIGNS:  Blood pressure is 113/68, heart rate is  87, respiratory rate 16, temperature afebrile.  GENERAL:  This patient is a moderately to minimally obese white male who is  alert, cooperative at the time of examination.  HEENT EXAMINATION:  Head is atraumatic.  Eyes, pupils are equal, round, and  reactive to light.  Discs, soft and flat bilaterally.  NECK:  Supple.  No carotid bruits are noted.  RESPIRATORY EXAMINATION:  Clear.  CARDIOVASCULAR EXAMINATION:  Reveals a regular rate and rhythm.  No obvious  murmurs or rubs are noted.  ABDOMEN:  Reveals positive bowel sounds.  No organomegaly or tenderness is  noted.  EXTREMITIES:  Without significant edema.  NEUROLOGIC EXAMINATION:  Cranial nerves as above.  Facial symmetry is  present.  Patient notes some decreased pinprick sensation of the right face  compared to the left.  Patient has full extraocular movements.  Visual  fields are full to double simultaneous stimulation.  Speech is hesitant,  deliberate.  Patient has difficulty with repetition, has fair naming.  Patient has excellent strength on all four extremities.  Good, symmetric  motor tone is noted throughout.  Sensory testing reveals decreased pinprick  sensation to forearm and particularly the hand on the right, normal on the  left.  Patient has normal sensation to lower extremities bilaterally.  Vibratory sensation is impaired in the right hand as compared to the left,  normal in both lower extremities.  Patient has fair finger-to-nose-finger,  toe-to-finger bilaterally.  Gait is not tested.  Deep tendon  reflexes are  symmetric and normal.  Toes are downgoing bilaterally.  No pronator drift is  seen.  Speech is also slightly dysarthric.   LABORATORY DATA:  CT scan of the head is as above.  Chest x-ray, EKG are  pending.   Blood work reveals a white count of 12.9, hemoglobin of 15.6, hematocrit of  43.2, platelets of 235, sodium of 138, potassium of 3.6, chloride of 101,  CO2 of 30, glucose of 133, BUN of 21, creatinine of 1.5, calcium of 9.7,  total protein of 6.5, albumin of 3.8, AST of 30, ALT of 74, alkaline  phosphatase of 68, total bili of 0.7, INR of 0.8.   IMPRESSION:  1. New-onset left brain stroke.  2. Hypertension.  3. Tobacco abuse.  This patient does have risk factors for stroke.  Patient     is relatively young; however, we will admit the patient for further     evaluation of the above problem.   PLAN:  1. Admission to [facility], a monitored bed.  2. IV fluid hydration.  3. Aspirin therapy.  4. MRI of the brain.  5. MRI angiogram.  6.     A 2-D echocardiogram.  7. Occupational therapy evaluation.   We will follow the patient's clinical course while in house.                                                 Marlan Palau, M.D.    CKW/MEDQ  D:  03/30/2003  T:  03/31/2003  Job:  [id number]   cc:   [facility]  [address]  [address]   [place] (?), M.D.    cc:   [facility]  [address]  [address]   [place] (?), M.D.

## 2011-02-20 NOTE — Telephone Encounter (Signed)
Patient checking status of rx for pain meds, says asked RN to send message when he was here last.

## 2011-02-20 NOTE — Group Therapy Note (Signed)
REASON FOR CONSULTATION:  Low back and bilateral leg pain.  Consult  requested by Jeffrey [name], M.D.   HISTORY:  [name] is a 50 year old male with past medical history  significant for left parietal embolic infarct due to patent foramen ovale in  June 2004.  He has had some back pain intermittently prior to that time;  however, after he went home, he fell following discharge from his stroke  stay and developed low back pain with lower extremity pain.  He was seen by  neurosurgery, and he had an MRI of his lumbar spine done.  This MRI  demonstrated significant lumbar degenerative changes, decreased disk, a  bulge at L3-4, spondylolisthesis, L4-5, central and left-sided disk  herniation and some canal stenosis, and L5-S1 spondylitic changes and right-  sided disk protrusion.  Surgical intervention was considered; however, the  patient declined.  He gives a history of his father having had six back  surgeries and still in bad shape.   He has been followed through the family practice center by Dr. Rodolph [name].  He has had left hip film in June 08, 2003, which was relatively normal.  Medication-wise, he has been tried on Vicodin and Ultram, neither of which  really helped, and the OxyContin 20 mg b.i.d. is helpful with a breakthrough  medicine of OxyIR, was 10/650 mg, reduced to 5/325 mg.  He has had some  problems with polydipsia, had some workup for diabetes but no follow-up to  that yet.  Other past medical history includes hypertension, tobacco abuse,  hyperhomocysteinemia, depression, right arthroscopic knee surgery and GERD.   CURRENT MEDICATIONS:  1.  Warfarin 5 mg daily.  2.  Prozac 20 mg p.o. daily.  3.  Diovan/hydrochlorothiazide 160/25 mg.  4.  OxyContin 20 mg q.12y.  5.  OxyIR 5 mg t.i.d.   OTHER THERAPIES TRIED:  He has never tried physical therapy and states that  the co-pay may be too high for him.   Other review of systems positive for insomnia.   His  functional status:  Disabled from work since his stroke March 30, 2003.  He is independent with all self-care and mobility.  He still drives.  He  does cut his grass with a self-propelled push mower.  He works with his  wife, who works full-time.   HABITS:  He states that he will smoke marijuana to fall asleep.   REVIEW OF SYSTEMS:  Positive shortness of breath, coughing, heart murmur,  weakness, numbness, stroke, anxiety, depression, poor sleep, agitation,  reflux, heartburn, easy bruising from Coumadin, and excess sweating.   PHYSICAL EXAMINATION:  A well-developed, mildly obese male in no acute  distress.  Mood and affect are appropriate.  He does have some grunting and  groaning with position changes, getting on and off the exam table.  He has  some hypersensitivity to touch over the L5-S1 area.  His lumbar range of  motion is 75% forward flexion, 25% extension.  His hip range of motion  reduced in the left hip to internal-external rotation accompanied by pain.  His right lower extremity range of motion is well-preserved at the knee and  ankle.  Left knee and ankle are also preserved.  His motor strength his 5/5  bilateral deltoid, biceps, triceps, grip, as well as hip flexor, knee  extension and ankle dorsiflexion.  He has decreased sensation in the right  upper extremity.   He has no evidence of ataxia in the bilateral upper extremities to  finger-  nose-finger, lower extremity heel-to-shin limited by hip range of motion.   His deep tendon reflexes are normal.  Faber's attempted but limited by pain.   IMPRESSION:  1.  Probable lumbar stenosis.  Neurosurgery did mention perhaps some      congenital stenosis.  Certainly may have progressed over the last two      years.  For that reason, would repeat imaging studies including MRI of      the lumbosacral spine and left hip film given extremely limited range of      motion of the left hip.  2.  Insomnia.  Would start some  trazodone.  3.  Reported illicit drug abuse.  He states he uses it for sleep but is      willing to quit and will submit urine drug screen today but also repeat      in three weeks if positive.  He cannot tell me exactly the last time      that he had smoked marijuana.  If other substances, i.e., cocaine, are      found, then would also insist on inpatient treatment program prior to      any type of more intensive pain management.  4.  Consider lumbar epidural steroid injections.  Would have to be okayed to      come off of Coumadin for approximately one week prior to injection.  5.  Would recommend physical therapy but will need to discuss further with      patient because he seems reluctant based on fears of excess co-pays.       AEK/MedQ  D:  04/27/2005 13:27:19  T:  04/27/2005 14:58:31  Job #:  [id number]

## 2011-02-20 NOTE — Discharge Summary (Signed)
NAME:  Jeffrey [name], Jeffrey [name]                          ACCOUNT NO.:  [id number]   MEDICAL RECORD NO.:  [id number]                   PATIENT TYPE:  INP   LOCATION:  3017                                 FACILITY:   PHYSICIAN:  Mariel Aloe                        DATE OF BIRTH:   DATE OF ADMISSION:  03/30/2003  DATE OF DISCHARGE:  04/06/2003                                 DISCHARGE SUMMARY   DISCHARGE DIAGNOSES:  1. Left parietal embolic infarction  secondary to probable patent foramen     ovale/atrial septal defect.  2. Positive microcavitation study on transesophageal echocardiogram.  3. Tobacco abuse.  4. Hypertension.  5. Obesity.  6. Hyperhomocystinemia.  7. Depression.  8. Right knee arthroscopic surgery.  9. Gastroesophageal reflux disease.   DISCHARGE MEDICATIONS:  1. Lexapro 20 mg daily.  2. Nexium 40 mg daily.  3. Diovan 60 mg daily.  4. Zocor 20 mg daily.  5. Foltx one daily.  6. Coumadin 12.5 mg daily, currently adjusting.  7. Nicotine patch 21 mg x1 day 14 mg x14 days, then 7 mg x14 days, then     discontinue.   DIET:  Low salt, low cholesterol.  Regular consistency, thin liquids.   STUDIES:  CT scan of the brain performed on admission revealed a small acute  left parietal lobe infarction.  No hemorrhage or mass effect.  MRI of the  brain showed an acute cortical infarct in the left parietal lobe, embolic  appearing.  MRA of the neck showed distal right vertebral artery, small but  patent. Otherwise negative.  MRA of the neck showed a hypoplastic distal  right vertebral artery with no significant carotid stenosis.  Carotid  Doppler which shows no ICA stenosis bilaterally.  Vertebral artery flow  antegrade.   A 2-D echocardiogram shows ejection fraction of 55-65% with no LV regional  wall motion abnormalities.  No diagnostic echocardiographic evidence of  cardiac source of embolus.  Transesophageal echocardiogram performed by Dr.  Olga Millers reveals ejection  fraction 55-65%, normal mitral valve, normal  left atrium, right ventricle and right atrium.  No left atrial appendage  thrombus identified.  Color Doppler - there was a right to left shunt by  saline, micro cavitation study. Typically suggested patent foramen ovale or  atrial septal defect.  EKG which shows normal sinus rhythm.   LABORATORY DATA:  INR at the time of discharge 1.4 with hemoglobin on the  last study 15.8, white blood cell count 6.1 and platelets 233.  White blood  cells had gone up to 12.9 during hospital stay.  Neutrophils elevated  slightly at 80 on admission.  Sodium 138, potassium 3.6, chloride 101, CO2  30, glucose 133, BUN 21, creatinine 1.5, calcium 9.7, total protein 6.5,  albumin 3.8, AST 38, ALT 74, ALP 68, total bilirubin 0.7, homocystine  elevated at 16.38, cholesterol 186, triglycerides  247, HDL 30, LDL 107.  Urine was positive for benzodiazepines; otherwise negative.  Serum drug  screen negative.  Antithrombin III 99, antiphospholipid negative, and lupus  anticoagulant negative.   HISTORY OF PRESENT ILLNESS:  [name] is a 50 year old right-handed  male with history of tobacco and alcohol abuse who comes to the emergency  room for evaluation of onset of right hand and arm numbness and speech  disturbance that was noticed upon awakening this morning.  He denies history  of smoke.  His right arm was clumsy and numb and he could not talk properly.  He waited around all day for his wife to return home from work and got ready  to go to the emergency room.  CT scan of the brain showed evidence of a left  parietal small infarct with no other abnormalities.  The patient was  admitted to the hospital for further stroke workup.  The patient was not a  tPA candidate due to time of onset.   HOSPITAL COURSE:  MRI did confirm acute prior infarction as embolic source.  Carotid Doppler and 2-D echo were negative for source.  Transesophageal echo  was done.  It was  performed by Dr. Olga Millers and was found to be  positive for a micro cavitation study suggestive of PFO or ASD.  The patient  was placed on Coumadin for long term therapy for secondary stroke  prevention.   Other risk factors identified during the hospitalization include  dyslipidemia with elevated LDLs and triglycerides.  Lowest LDL was less than  100.  Also homocystine elevated and was treated with Foltx and folic acid,  vitamin B complex, medication.   The patient with some right arm clumsiness.  Occupational therapy as an  outpatient recommended.  Did well with physical therapy, and has no physical  therapy needs.  Numbness issue did resolve during the hospitalization.  He  was not felt able to be followed up for that.  The patient is resistant to  heparin and Coumadin and it was decided to go ahead and let the patient go  home with subtherapeutic Coumadin and will monitor as an outpatient as INR  rises.  No need to concomitantly with IV heparin or Lovenox.  No studies  that this benefits the patient.   CONDITION ON DISCHARGE:  The patient alert and oriented x3.  Speech clear.  No aphasia.  He does seem to be functioning at a high level modalities and  he was on admission. His right upper extremity is slightly clumsy with poor  stereognosis in his right hand.   DISCHARGE INSTRUCTIONS:  1. Discharge home with wife.  2. Coumadin for secondary stroke prevention.  __________  neurologic, will     continue to adjust Coumadin until therapeutic.  Will get labs drawn in     Dr. Linda Hedges office on Saturday July 3.  Results to be called to M.D. on     call for prescription and will adjust dose as needed.  Would like Dr.     Ivory Broad to follow long term.  3. Zocor for elevated cholesterol.  Will need followup in four to six weeks.  4. Smoking cessation, currently on nicotine patch.  He is advised not to     smoke in addition. 5. Put no work for now.  The patient currently with a new job  at [employer]     and is anxious to get back to work.  So occupational therapy needed to  evaluate for safe return.  6. Follow up with __________  in three to four weeks.  Call for an     appointment.  7. Dr. Ivory Broad within the next month.     Annie Main, N.P.                         Mariel Aloe    SB/MEDQ  D:  04/06/2003  T:  04/08/2003  Job:  [id number]   cc:   Santina Evans A. Orlin Hilding, M.D.  [address]  [address]  [place]  [place] [address]  Fax: [phone]   Garth Schlatter, M.D.  [facility]    cc:   Gustavus Messing. Orlin Hilding, M.D.  [address]  [address]  [place]  [place] [address]  Fax: [phone]   Garth Schlatter, M.D.  [facility]

## 2011-02-23 MED ORDER — OXYCODONE-ACETAMINOPHEN 10-650 MG PO TABS: 1.0000 | ORAL_TABLET | Freq: Four times a day (QID) | ORAL | Status: DC | PRN

## 2011-02-23 MED ORDER — FENTANYL 25 MCG/HR TD PT72: 1.0000 | MEDICATED_PATCH | TRANSDERMAL | Status: DC

## 2011-02-23 NOTE — Telephone Encounter (Signed)
Meds filled. Given to red team nurse Arlyss Repress for patient contact.

## 2011-02-23 NOTE — Telephone Encounter (Signed)
Completed. See 02/23/11 documentation.

## 2011-02-23 NOTE — Telephone Encounter (Signed)
RX UP FRONT WITH ERIN. Arlyss Repress

## 2011-03-03 ENCOUNTER — Encounter: Payer: Self-pay | Admitting: Family Medicine

## 2011-03-03 ENCOUNTER — Ambulatory Visit (INDEPENDENT_AMBULATORY_CARE_PROVIDER_SITE_OTHER): Payer: Medicare Other | Admitting: Family Medicine

## 2011-03-03 VITALS — BP 163/109 | HR 68 | Wt 240.5 lb

## 2011-03-03 DIAGNOSIS — F329 Major depressive disorder, single episode, unspecified: Secondary | ICD-10-CM

## 2011-03-03 DIAGNOSIS — I10 Essential (primary) hypertension: Secondary | ICD-10-CM

## 2011-03-03 DIAGNOSIS — K089 Disorder of teeth and supporting structures, unspecified: Secondary | ICD-10-CM

## 2011-03-03 DIAGNOSIS — N138 Other obstructive and reflux uropathy: Secondary | ICD-10-CM

## 2011-03-03 DIAGNOSIS — K0889 Other specified disorders of teeth and supporting structures: Secondary | ICD-10-CM

## 2011-03-03 DIAGNOSIS — N401 Enlarged prostate with lower urinary tract symptoms: Secondary | ICD-10-CM

## 2011-03-03 DIAGNOSIS — F32A Depression, unspecified: Secondary | ICD-10-CM

## 2011-03-03 MED ORDER — CITALOPRAM HYDROBROMIDE 20 MG PO TABS: 40.0000 mg | ORAL_TABLET | Freq: Every day | ORAL | Status: DC

## 2011-03-03 NOTE — Patient Instructions (Signed)
It was good to see you today I am changing you to the generic form of celexa since this seems to help with your mood I am referring you to the dentist for your tooth pain  Come back to see me in 1 month to discuss your smoking, Otherwise, call with any questions, God Bless, Doree Albee MD

## 2011-03-08 DIAGNOSIS — K0889 Other specified disorders of teeth and supporting structures: Secondary | ICD-10-CM | POA: Insufficient documentation

## 2011-03-08 NOTE — Progress Notes (Signed)
  Subjective:    Patient ID: Jeffrey [name], male    DOB: July 19, 1961, 50 y.o.   MRN: [id number]  HPI Pt here for chronic problem follow up. Mood- Overall stable since last clinical visit. Pt states that he took generic version of celexa that his sister had and that focus and mood much improved since taking generic celexa. Has been compliant with wellbutrin and celexa.  HTN- Not checking blood pressures at home. No HA, CP, SOB per pt.  Tooth Pain: Pt reprots 1-2 weeks of severe tooth pain. This has been a recurrent issue for the patient. Pain acutely worse over last week. No fevers, swelling, drainage.  Review of Systems     Objective:   Physical Exam Gen: in chair, NAD  HEENT: NCAT, dental carries CV: RRR, no murmurs auscultated  PULM: CTAB, no wheezes, rales, rhoncii  ABD: S/NT/+ bowel sounds  EXT: 2+ peripheral pulses  Psych-appropriate insight and judgment, mood appropriate conversive.   Assessment & Plan:  Mood- Will transition to generic celexa ([employer] brand). Will continue with wellbutrin and celexa. HTN- Elevated secondary to tooth pain. Will continue with current regimen. Will reassess at next visit. Hx/o thiazide induced hypercalcemia. May consider addition of ACE.  Tooth Pain- Will refer to dentistry. No active signs of infection. On doxy chronically for hx/o septic hip.

## 2011-03-08 NOTE — Assessment & Plan Note (Signed)
Elevated secondary to tooth pain. Will continue with current regimen. Will reassess at next visit. Hx/o thiazide induced hypercalcemia. May consider addition of ACE.

## 2011-03-08 NOTE — Assessment & Plan Note (Signed)
Will transition to generic celexa ([employer] brand). Will continue with wellbutrin and celexa.

## 2011-03-08 NOTE — Assessment & Plan Note (Signed)
Will refer to dentistry. No active signs of infection. On doxy chronically for hx/o septic hip.

## 2011-03-17 ENCOUNTER — Ambulatory Visit: Payer: Medicare Other | Admitting: *Deleted

## 2011-03-18 ENCOUNTER — Ambulatory Visit (INDEPENDENT_AMBULATORY_CARE_PROVIDER_SITE_OTHER): Payer: Medicare Other | Admitting: *Deleted

## 2011-03-18 DIAGNOSIS — E349 Endocrine disorder, unspecified: Secondary | ICD-10-CM

## 2011-03-18 DIAGNOSIS — E291 Testicular hypofunction: Secondary | ICD-10-CM

## 2011-03-18 NOTE — Progress Notes (Signed)
Patient requests refill on pain meds and also has decided he wants to quit smoking and wants RX for Chantix. Will forward message to MD.

## 2011-03-24 ENCOUNTER — Telehealth: Payer: Self-pay | Admitting: Family Medicine

## 2011-03-24 NOTE — Telephone Encounter (Signed)
Pt checking status of rx for pain medication and chantix

## 2011-03-26 ENCOUNTER — Other Ambulatory Visit: Payer: Self-pay | Admitting: Family Medicine

## 2011-03-26 DIAGNOSIS — G8929 Other chronic pain: Secondary | ICD-10-CM

## 2011-03-26 MED ORDER — FENTANYL 25 MCG/HR TD PT72: 1.0000 | MEDICATED_PATCH | TRANSDERMAL | Status: DC

## 2011-03-26 MED ORDER — OXYCODONE-ACETAMINOPHEN 10-650 MG PO TABS: 1.0000 | ORAL_TABLET | Freq: Four times a day (QID) | ORAL | Status: DC | PRN

## 2011-03-31 ENCOUNTER — Ambulatory Visit: Payer: Medicare Other | Admitting: Family Medicine

## 2011-04-02 ENCOUNTER — Ambulatory Visit (INDEPENDENT_AMBULATORY_CARE_PROVIDER_SITE_OTHER): Payer: Medicare Other | Admitting: *Deleted

## 2011-04-02 DIAGNOSIS — E349 Endocrine disorder, unspecified: Secondary | ICD-10-CM

## 2011-04-02 DIAGNOSIS — E291 Testicular hypofunction: Secondary | ICD-10-CM

## 2011-04-15 ENCOUNTER — Other Ambulatory Visit: Payer: Self-pay | Admitting: *Deleted

## 2011-04-15 ENCOUNTER — Telehealth: Payer: Self-pay | Admitting: Family Medicine

## 2011-04-15 NOTE — Telephone Encounter (Signed)
Deny this rx and have it directed to PMD Alvester Morin).  Thanks.

## 2011-04-15 NOTE — Telephone Encounter (Signed)
Would like to know if Chantix will be prescribed.  It was discussed at the last appt and has called but never got an answer.

## 2011-04-16 ENCOUNTER — Ambulatory Visit (INDEPENDENT_AMBULATORY_CARE_PROVIDER_SITE_OTHER): Payer: Medicare Other | Admitting: *Deleted

## 2011-04-16 ENCOUNTER — Telehealth: Payer: Self-pay | Admitting: Family Medicine

## 2011-04-16 DIAGNOSIS — E291 Testicular hypofunction: Secondary | ICD-10-CM

## 2011-04-16 DIAGNOSIS — E349 Endocrine disorder, unspecified: Secondary | ICD-10-CM

## 2011-04-16 DIAGNOSIS — Z720 Tobacco use: Secondary | ICD-10-CM

## 2011-04-16 MED ORDER — TESTOSTERONE CYPIONATE 200 MG/ML IM SOLN: 300.0000 mg | Freq: Once | INTRAMUSCULAR | Status: DC

## 2011-04-16 MED ORDER — VARENICLINE TARTRATE 0.5 MG X 11 & 1 MG X 42 PO MISC: ORAL | Status: DC

## 2011-04-16 MED ORDER — VARENICLINE TARTRATE 0.5 MG X 11 & 1 MG X 42 PO MISC: ORAL | Status: AC

## 2011-04-16 NOTE — Telephone Encounter (Signed)
Patient in today to receive testosterone injection and asks about Chantix RX.  States he discussed with  MD at last visit. He planned to start [birth year] on [birth year]. He wants to know if med can be prescribed or does  MD not want him to have. Also will  need RX for pain meds by 04/25/2011. Letting MD know in advance.

## 2011-04-16 NOTE — Telephone Encounter (Signed)
Called and followed up with pt about chantix rx. Told pt that I would call this in. Also discussed suicidal red flags. Told pt that he would need to be seen before the 21st for any additional narcotic refills. Told pt that we would space out refills at this point so that he could be seen q2-75months with new [facility] narcotic policy. Pt agreeable.   Addendum: Only physical rx could be filled for chantix. Pt will have to pick up. Left VM for pt about this.

## 2011-04-20 ENCOUNTER — Encounter: Payer: Self-pay | Admitting: Family Medicine

## 2011-04-20 ENCOUNTER — Other Ambulatory Visit: Payer: Self-pay | Admitting: Family Medicine

## 2011-04-20 ENCOUNTER — Ambulatory Visit (INDEPENDENT_AMBULATORY_CARE_PROVIDER_SITE_OTHER): Payer: Medicare Other | Admitting: Family Medicine

## 2011-04-20 DIAGNOSIS — I10 Essential (primary) hypertension: Secondary | ICD-10-CM

## 2011-04-20 DIAGNOSIS — G8929 Other chronic pain: Secondary | ICD-10-CM

## 2011-04-20 MED ORDER — FENTANYL 25 MCG/HR TD PT72: 1.0000 | MEDICATED_PATCH | TRANSDERMAL | Status: DC

## 2011-04-20 MED ORDER — LISINOPRIL 10 MG PO TABS: 10.0000 mg | ORAL_TABLET | Freq: Every day | ORAL | Status: DC

## 2011-04-20 MED ORDER — OXYCODONE-ACETAMINOPHEN 10-650 MG PO TABS: 1.0000 | ORAL_TABLET | Freq: Four times a day (QID) | ORAL | Status: DC | PRN

## 2011-04-20 NOTE — Telephone Encounter (Signed)
Refill request

## 2011-04-20 NOTE — Assessment & Plan Note (Signed)
Will add on lisinopril for BP control. HR in low 70s/high 60s. Somewhat narrow window for increasing metoprolol currently. Will follow up in 1 month. Will check Cr and K at that time.

## 2011-04-20 NOTE — Progress Notes (Signed)
  Subjective:    Patient ID: Jeffrey [name], male    DOB: 12/06/1960, 50 y.o.   MRN: [id number]  HPI Pt here for follow up on HTN and pain: HTN: Currently on norvasc 10 and lopressor 100 BID. Has not been checking BPs daily. Denies HA, CP, SOB. Has been compliant on current regimen. Previously on HCTZ; however discontinued secondary to thiazide induced hypercalcemia.   Pain: Overall well controlled with 25 mcg fentanyl patch and percocet 10/650. Baseline hx/o avascular necrosis of the hip, disc herniation, and chronic back pain.  Feels that as his mood has improved (recently placed on different brand of celexa) and also has increased energy and focus. Feels that he has had some intermittent increase in pain secondary to increased energy, but overall feels that current regimen is adequate for pain control.   Review of Systems See HPI    Objective:   Physical Exam Gen: IN chair, NAD  CV: RRR, no murmurs auscultated  PULM: CTAB, no wheezes, rales, rhoncii  ABD: S/NT/+ bowel sounds  EXT: 2+ peripheral pulses  Assessment & Plan:  HTN: Will add on lisinopril for BP control. HR in low 70s/high 60s. Somewhat narrow window for increasing metoprolol currently. Will follow up in 1 month. Will check Cr and K at that time.  Pain: Will refill pain medications today. Monthly refill is on the 21st of every month. Will likely place on 3 month script regimen at next visit.

## 2011-04-20 NOTE — Patient Instructions (Signed)
It was good to see you today  I am placing you on lisinopril for your blood pressures I have also refilled your narcotic medications Come back to see me in 1 month to follow up on your blood pressure medications.  Call if any questions,  Doree Albee MD

## 2011-04-20 NOTE — Assessment & Plan Note (Signed)
Will refill pain medications today. Monthly refill is on the 21st of every month. Will likely place on 3 month script regimen at next visit.

## 2011-04-27 ENCOUNTER — Encounter: Payer: Self-pay | Admitting: Family Medicine

## 2011-04-30 ENCOUNTER — Ambulatory Visit (INDEPENDENT_AMBULATORY_CARE_PROVIDER_SITE_OTHER): Payer: Medicare Other | Admitting: *Deleted

## 2011-04-30 DIAGNOSIS — E349 Endocrine disorder, unspecified: Secondary | ICD-10-CM

## 2011-04-30 DIAGNOSIS — E291 Testicular hypofunction: Secondary | ICD-10-CM

## 2011-04-30 MED ORDER — TESTOSTERONE CYPIONATE 200 MG/ML IM SOLN
300.0000 mg | Freq: Once | INTRAMUSCULAR | Status: AC
  Administered 2011-04-30: 300 mg via INTRAMUSCULAR

## 2011-05-01 ENCOUNTER — Encounter: Payer: Self-pay | Admitting: Family Medicine

## 2011-05-08 ENCOUNTER — Ambulatory Visit: Payer: Medicare Other | Admitting: Family Medicine

## 2011-05-18 ENCOUNTER — Other Ambulatory Visit: Payer: Self-pay | Admitting: Family Medicine

## 2011-05-18 ENCOUNTER — Ambulatory Visit (INDEPENDENT_AMBULATORY_CARE_PROVIDER_SITE_OTHER): Payer: Medicare Other | Admitting: *Deleted

## 2011-05-18 DIAGNOSIS — R7989 Other specified abnormal findings of blood chemistry: Secondary | ICD-10-CM

## 2011-05-18 DIAGNOSIS — E291 Testicular hypofunction: Secondary | ICD-10-CM

## 2011-05-18 MED ORDER — TESTOSTERONE CYPIONATE 200 MG/ML IM SOLN
300.0000 mg | Freq: Once | INTRAMUSCULAR | Status: AC
  Administered 2011-05-18: 300 mg via INTRAMUSCULAR

## 2011-05-18 NOTE — Telephone Encounter (Signed)
Refill request

## 2011-05-21 ENCOUNTER — Ambulatory Visit (INDEPENDENT_AMBULATORY_CARE_PROVIDER_SITE_OTHER): Payer: Medicare Other | Admitting: Family Medicine

## 2011-05-21 ENCOUNTER — Encounter: Payer: Self-pay | Admitting: Family Medicine

## 2011-05-21 VITALS — BP 156/101 | HR 71 | Wt 235.2 lb

## 2011-05-21 DIAGNOSIS — M549 Dorsalgia, unspecified: Secondary | ICD-10-CM

## 2011-05-21 DIAGNOSIS — G8929 Other chronic pain: Secondary | ICD-10-CM

## 2011-05-21 DIAGNOSIS — I10 Essential (primary) hypertension: Secondary | ICD-10-CM

## 2011-05-21 LAB — BASIC METABOLIC PANEL
BUN: 14 mg/dL (ref 6–23)
CO2: 29 mEq/L (ref 19–32)
Calcium: 9.8 mg/dL (ref 8.4–10.5)
Chloride: 99 mEq/L (ref 96–112)
Creat: 1.31 mg/dL (ref 0.50–1.35)
Glucose, Bld: 97 mg/dL (ref 70–99)

## 2011-05-21 MED ORDER — FENTANYL 25 MCG/HR TD PT72: 1.0000 | MEDICATED_PATCH | TRANSDERMAL | Status: DC

## 2011-05-21 MED ORDER — CARVEDILOL 25 MG PO TABS: 25.0000 mg | ORAL_TABLET | Freq: Two times a day (BID) | ORAL | Status: DC

## 2011-05-21 MED ORDER — OXYCODONE-ACETAMINOPHEN 10-650 MG PO TABS: 1.0000 | ORAL_TABLET | Freq: Four times a day (QID) | ORAL | Status: DC | PRN

## 2011-05-21 NOTE — Assessment & Plan Note (Signed)
Fentanyl and percocet refilled today. One additional refill for 06/2011

## 2011-05-21 NOTE — Assessment & Plan Note (Addendum)
Will transition pt to coreg from metoprolol for both beta and alpha blockade. Pt already on CCB. Pt noted to already be on cardura for BPH.  Currently contraindications to ACEs and thiazides. Will follow up BP in 1 month. Will also check BMET.

## 2011-05-21 NOTE — Progress Notes (Signed)
  Subjective:    Patient ID: Larene Pickett., male    DOB: 09/01/1961, 50 y.o.   MRN: [id number]  Hypertension This is a chronic problem. The current episode started more than 1 year ago. The problem is uncontrolled. Pertinent negatives include no chest pain, headaches, orthopnea, palpitations or peripheral edema. Agents associated with hypertension include steroids (on testosterone injections for low testosterone ). Risk factors for coronary artery disease include smoking/tobacco exposure. Past treatments include calcium channel blockers, beta blockers and lifestyle changes. Compliance problems include medication side effects.  Hypertensive end-organ damage includes kidney disease.   Pt was recently placed on ACE at last visit. Pt reports trouble with urination with medication that improved with discontinuation as well as a self reported history of ACE induced renal failure. Pt states that urinary frequency and dysuria resolved after discontinuation of medication.     Review of Systems  Cardiovascular: Negative for chest pain, palpitations and orthopnea.  Neurological: Negative for headaches.       Objective:   Physical Exam  Constitutional: He is oriented to person, place, and time. He appears well-developed.  HENT:  Head: Normocephalic and atraumatic.  Neck: Normal range of motion. Neck supple.  Cardiovascular: Normal rate and regular rhythm.   Pulmonary/Chest: Effort normal and breath sounds normal.  Neurological: He is alert and oriented to person, place, and time.  Skin: Skin is warm.          Assessment & Plan:

## 2011-05-21 NOTE — Patient Instructions (Signed)
It was good to see you today  I have switched you from metoprolol to coreg for your BP  Come back in 1 week for a BP check Otherwise follow up with me in 2 months  Call with any questions,  God Bless, Doree Albee MD

## 2011-05-25 ENCOUNTER — Encounter: Payer: Self-pay | Admitting: Family Medicine

## 2011-05-26 ENCOUNTER — Other Ambulatory Visit: Payer: Self-pay | Admitting: Family Medicine

## 2011-05-26 NOTE — Telephone Encounter (Signed)
Refill request

## 2011-06-01 ENCOUNTER — Ambulatory Visit (INDEPENDENT_AMBULATORY_CARE_PROVIDER_SITE_OTHER): Payer: Medicare Other | Admitting: *Deleted

## 2011-06-01 VITALS — BP 136/98 | HR 84

## 2011-06-01 DIAGNOSIS — E349 Endocrine disorder, unspecified: Secondary | ICD-10-CM

## 2011-06-01 DIAGNOSIS — E291 Testicular hypofunction: Secondary | ICD-10-CM

## 2011-06-01 MED ORDER — TESTOSTERONE CYPIONATE 200 MG/ML IM SOLN
300.0000 mg | Freq: Once | INTRAMUSCULAR | Status: AC
  Administered 2011-06-01: 300 mg via INTRAMUSCULAR

## 2011-06-01 NOTE — Progress Notes (Signed)
Dr. Alvester Morin notified of BP readings today. Follow up in 2 weeks for labs and recheck BP.

## 2011-06-15 ENCOUNTER — Ambulatory Visit (INDEPENDENT_AMBULATORY_CARE_PROVIDER_SITE_OTHER): Payer: Medicare Other | Admitting: *Deleted

## 2011-06-15 ENCOUNTER — Other Ambulatory Visit: Payer: Medicare Other

## 2011-06-15 DIAGNOSIS — I10 Essential (primary) hypertension: Secondary | ICD-10-CM

## 2011-06-15 DIAGNOSIS — Z23 Encounter for immunization: Secondary | ICD-10-CM

## 2011-06-15 DIAGNOSIS — E349 Endocrine disorder, unspecified: Secondary | ICD-10-CM

## 2011-06-15 LAB — BASIC METABOLIC PANEL
BUN: 10 mg/dL (ref 6–23)
Creat: 1.34 mg/dL (ref 0.50–1.35)
Glucose, Bld: 104 mg/dL — ABNORMAL HIGH (ref 70–99)

## 2011-06-15 MED ORDER — TESTOSTERONE CYPIONATE 200 MG/ML IM SOLN
300.0000 mg | Freq: Once | INTRAMUSCULAR | Status: AC
  Administered 2011-06-15: 300 mg via INTRAMUSCULAR

## 2011-06-15 MED ORDER — INFLUENZA VIRUS VACC SPLIT PF IM SUSP
0.5000 mL | Freq: Once | INTRAMUSCULAR | Status: AC
  Administered 2011-06-15: 0.5 mL via INTRAMUSCULAR

## 2011-06-15 NOTE — Progress Notes (Signed)
BMP DONE TODAY Jeffrey [name] 

## 2011-06-15 NOTE — Progress Notes (Signed)
BP checked manually using large adult cuff. LA 140/100, RA 140/96 pulse 72. Labs drawn today as directed at last visit. Will send message to Dr. Alvester Morin about BP today.

## 2011-06-25 LAB — CBC
HCT: 48.7
Platelets: 283
RDW: 13.4

## 2011-06-25 LAB — POCT URINALYSIS DIP (DEVICE)
Glucose, UA: NEGATIVE
Nitrite: POSITIVE — AB
Operator id: 270961
Protein, ur: 100 — AB
Urobilinogen, UA: 0.2

## 2011-06-25 LAB — DIFFERENTIAL
Basophils Absolute: 0.2 — ABNORMAL HIGH
Eosinophils Absolute: 0.2
Eosinophils Relative: 1
Lymphocytes Relative: 16
Neutrophils Relative %: 73

## 2011-06-26 ENCOUNTER — Ambulatory Visit: Payer: Medicare Other | Admitting: Family Medicine

## 2011-06-29 ENCOUNTER — Ambulatory Visit: Payer: Medicare Other

## 2011-07-01 ENCOUNTER — Ambulatory Visit (INDEPENDENT_AMBULATORY_CARE_PROVIDER_SITE_OTHER): Payer: Medicare Other | Admitting: *Deleted

## 2011-07-01 DIAGNOSIS — E349 Endocrine disorder, unspecified: Secondary | ICD-10-CM

## 2011-07-01 DIAGNOSIS — E291 Testicular hypofunction: Secondary | ICD-10-CM

## 2011-07-01 MED ORDER — TESTOSTERONE CYPIONATE 200 MG/ML IM SOLN
300.0000 mg | Freq: Once | INTRAMUSCULAR | Status: AC
  Administered 2011-07-01: 300 mg via INTRAMUSCULAR

## 2011-07-15 ENCOUNTER — Other Ambulatory Visit: Payer: Self-pay | Admitting: Family Medicine

## 2011-07-15 NOTE — Telephone Encounter (Signed)
Refill request

## 2011-07-20 ENCOUNTER — Ambulatory Visit: Payer: Medicare Other | Admitting: Family Medicine

## 2011-07-23 ENCOUNTER — Encounter: Payer: Self-pay | Admitting: Family Medicine

## 2011-07-23 ENCOUNTER — Ambulatory Visit (INDEPENDENT_AMBULATORY_CARE_PROVIDER_SITE_OTHER): Payer: Medicare Other | Admitting: Family Medicine

## 2011-07-23 VITALS — BP 145/98 | HR 73 | Ht 71.0 in | Wt 240.0 lb

## 2011-07-23 DIAGNOSIS — E349 Endocrine disorder, unspecified: Secondary | ICD-10-CM

## 2011-07-23 DIAGNOSIS — I10 Essential (primary) hypertension: Secondary | ICD-10-CM

## 2011-07-23 DIAGNOSIS — G8929 Other chronic pain: Secondary | ICD-10-CM

## 2011-07-23 DIAGNOSIS — E291 Testicular hypofunction: Secondary | ICD-10-CM

## 2011-07-23 MED ORDER — FENTANYL 25 MCG/HR TD PT72: 1.0000 | MEDICATED_PATCH | TRANSDERMAL | Status: DC

## 2011-07-23 MED ORDER — OXYCODONE-ACETAMINOPHEN 10-650 MG PO TABS: 1.0000 | ORAL_TABLET | Freq: Four times a day (QID) | ORAL | Status: DC | PRN

## 2011-07-23 MED ORDER — TESTOSTERONE CYPIONATE 200 MG/ML IM SOLN
300.0000 mg | Freq: Once | INTRAMUSCULAR | Status: AC
  Administered 2011-07-23: 300 mg via INTRAMUSCULAR

## 2011-07-23 NOTE — Assessment & Plan Note (Signed)
Stable on current regimen. Refill x3 months.

## 2011-07-23 NOTE — Patient Instructions (Signed)
2 Gram Low Sodium Diet A 2 gram sodium diet restricts the amount of sodium in the diet to no more than 2 g or 2000 mg daily. Limiting the amount of sodium is often used to help lower blood pressure. It is important if you have heart, liver, or kidney problems. Many foods contain sodium for flavor and sometimes as a preservative. When the amount of sodium in a diet needs to be low, it is important to know what to look for when choosing foods and drinks. The following includes some information and guidelines to help make it easier for you to adapt to a low sodium diet. QUICK TIPS  Do not add salt to food.   Avoid convenience items and fast food.   Choose unsalted snack foods.   Buy lower sodium products, often labeled as "lower sodium" or "no salt added."   Check food labels to learn how much sodium is in 1 serving.   When eating at a restaurant, ask that your food be prepared with less salt or none, if possible.  READING FOOD LABELS FOR SODIUM INFORMATION The nutrition facts label is a good place to find how much sodium is in foods. Look for products with no more than 500 to 600 mg of sodium per meal and no more than 150 mg per serving. Remember that 2 g = 2000 mg. The food label may also list foods as:  Sodium-free: Less than 5 mg in a serving.   Very low sodium: 35 mg or less in a serving.   Low-sodium: 140 mg or less in a serving.   Light in sodium: 50% less sodium in a serving. For example, if a food that usually has 300 mg of sodium is changed to become light in sodium, it will have 150 mg of sodium.   Reduced sodium: 25% less sodium in a serving. For example, if a food that usually has 400 mg of sodium is changed to reduced sodium, it will have 300 mg of sodium.  CHOOSING FOODS Grains  Avoid: Salted crackers and snack items. Some cereals, including instant hot cereals. Bread stuffing and biscuit mixes. Seasoned rice or pasta mixes.   Choose: Unsalted snack items. Low-sodium  cereals, oats, puffed wheat and rice, shredded wheat. English muffins and bread. Pasta.  Meats  Avoid: Salted, canned, smoked, spiced, pickled meats, including fish and poultry. Bacon, ham, sausage, cold cuts, hot dogs, anchovies.   Choose: Low-sodium canned tuna and salmon. Fresh or frozen meat, poultry, and fish.  Dairy  Avoid: Processed cheese and spreads. Cottage cheese. Buttermilk and condensed milk. Regular cheese.   Choose: Milk. Low-sodium cottage cheese. Yogurt. Sour cream. Low-sodium cheese.  Fruits and Vegetables  Avoid: Regular canned vegetables. Regular canned tomato sauce and paste. Frozen vegetables in sauces. Olives. Pickles. Relishes. Sauerkraut.   Choose: Low-sodium canned vegetables. Low-sodium tomato sauce and paste. Frozen or fresh vegetables. Fresh and frozen fruit.  Condiments  Avoid: Canned and packaged gravies. Worcestershire sauce. Tartar sauce. Barbecue sauce. Soy sauce. Steak sauce. Ketchup. Onion, garlic, and table salt. Meat flavorings and tenderizers.   Choose: Fresh and dried herbs and spices. Low-sodium varieties of mustard and ketchup. Lemon juice. Tabasco sauce. Horseradish.  SAMPLE 2 GRAM SODIUM MEAL PLAN Breakfast / Sodium (mg)  1 cup low-fat milk / 143 mg   2 slices whole-wheat toast / 270 mg   1 tbs heart-healthy margarine / 153 mg   1 hard-boiled egg / 139 mg   1 small orange / 0   mg  Lunch / Sodium (mg)  1 cup raw carrots / 76 mg    cup hummus / 298 mg   1 cup low-fat milk / 143 mg    cup red grapes / 2 mg   1 whole-wheat pita bread / 356 mg  Dinner / Sodium (mg)  1 cup whole-wheat pasta / 2 mg   1 cup low-sodium tomato sauce / 73 mg   3 oz lean ground beef / 57 mg   1 small side salad (1 cup raw spinach leaves,  cup cucumber,  cup yellow bell pepper) with 1 tsp olive oil and 1 tsp red wine vinegar / 25 mg  Snack / Sodium (mg)  1 container low-fat vanilla yogurt / 107 mg   3 graham cracker squares / 127 mg  Nutrient  Analysis  Calories: 2033   Protein: 77 g   Carbohydrate: 282 g   Fat: 72 g   Sodium: 1971 mg  Document Released: 09/21/2005 Document Revised: 06/03/2011 Document Reviewed: 12/23/2009 Exit[employer] Patient Information 2012 [employer]. 

## 2011-07-23 NOTE — Progress Notes (Signed)
  Subjective:    Patient ID: Jeffrey [name]., male    DOB: [birth year], 50 y.o.   MRN: [id number]  HPI Pt is here for genral medical follow up: Overall no acute issues  HTN: Pt not checking BPs everyday. No HA, CP, SOB. Current regimen includes norvasc 10, metoprolol 100 bid, cardura (for BPH). Pt denies any fatigue, LE swelling. Pt does report high salt intake including bologna, hot dogs, and sausage.  Chronic Pain: hx/o herniated disks, AVN of hips bilaterally. Has been on regimen of fentanyl patch 25 and  Percocet 10/650 # 100 monthly. Pt has been on this regimen for > 2 years with no change in dose or demands.       Review of Systems See HPI     Objective:   Physical Exam Gen: up in chair, NAD HEENT: NCAT, EOMI, TMs clear bilaterally CV: RRR, no murmurs auscultated PULM: CTAB, no wheezes, rales, rhoncii ABD: S/NT/+ bowel sounds  EXT: 2+ peripheral pulses   Assessment & Plan:

## 2011-07-23 NOTE — Assessment & Plan Note (Signed)
Elevated today. Discussed low slat diet. Handout given on this. Will follow up in 1 month. May consider transitioning to more nonselective beta blocker in setting of cardura use if BP still elevated. Noted contraindications to ACE (renal failure( and HCTZ (hypercalcemia) in the past. May consider re-introducing ACE in future if necessary ( ACEs noted to cause 30% increase in Cr on initial onset of use).

## 2011-08-05 ENCOUNTER — Ambulatory Visit (INDEPENDENT_AMBULATORY_CARE_PROVIDER_SITE_OTHER): Payer: Medicare Other | Admitting: *Deleted

## 2011-08-05 DIAGNOSIS — E291 Testicular hypofunction: Secondary | ICD-10-CM

## 2011-08-05 DIAGNOSIS — E349 Endocrine disorder, unspecified: Secondary | ICD-10-CM

## 2011-08-05 MED ORDER — TESTOSTERONE CYPIONATE 200 MG/ML IM SOLN
300.0000 mg | Freq: Once | INTRAMUSCULAR | Status: AC
  Administered 2011-08-05: 300 mg via INTRAMUSCULAR

## 2011-08-11 ENCOUNTER — Other Ambulatory Visit: Payer: Self-pay | Admitting: Family Medicine

## 2011-08-11 NOTE — Telephone Encounter (Signed)
Refill request

## 2011-08-19 ENCOUNTER — Other Ambulatory Visit: Payer: Self-pay | Admitting: Family Medicine

## 2011-08-19 NOTE — Telephone Encounter (Signed)
Refill request

## 2011-09-07 ENCOUNTER — Ambulatory Visit (INDEPENDENT_AMBULATORY_CARE_PROVIDER_SITE_OTHER): Payer: Medicare Other | Admitting: *Deleted

## 2011-09-07 DIAGNOSIS — E291 Testicular hypofunction: Secondary | ICD-10-CM

## 2011-09-07 MED ORDER — TESTOSTERONE CYPIONATE 200 MG/ML IM SOLN: 300.0000 mg | INTRAMUSCULAR | Status: DC

## 2011-09-08 MED ORDER — TESTOSTERONE CYPIONATE 200 MG/ML IM SOLN
300.0000 mg | Freq: Once | INTRAMUSCULAR | Status: AC
  Administered 2011-09-07: 300 mg via INTRAMUSCULAR

## 2011-09-21 ENCOUNTER — Other Ambulatory Visit: Payer: Self-pay | Admitting: Family Medicine

## 2011-09-21 NOTE — Telephone Encounter (Signed)
Jeffrey [name] needs a refill on his Wellbutrin and his medication for depression to go to [facility].

## 2011-09-21 NOTE — Telephone Encounter (Signed)
Refill request

## 2011-09-25 ENCOUNTER — Ambulatory Visit (INDEPENDENT_AMBULATORY_CARE_PROVIDER_SITE_OTHER): Payer: Medicare Other | Admitting: *Deleted

## 2011-09-25 DIAGNOSIS — E291 Testicular hypofunction: Secondary | ICD-10-CM

## 2011-09-25 DIAGNOSIS — E349 Endocrine disorder, unspecified: Secondary | ICD-10-CM

## 2011-09-25 MED ORDER — TESTOSTERONE CYPIONATE 200 MG/ML IM SOLN
300.0000 mg | Freq: Once | INTRAMUSCULAR | Status: AC
  Administered 2011-09-25: 300 mg via INTRAMUSCULAR

## 2011-10-21 ENCOUNTER — Ambulatory Visit (INDEPENDENT_AMBULATORY_CARE_PROVIDER_SITE_OTHER): Payer: Medicare Other | Admitting: Family Medicine

## 2011-10-21 ENCOUNTER — Ambulatory Visit
Admission: RE | Admit: 2011-10-21 | Discharge: 2011-10-21 | Disposition: A | Discharge status: Home / Self Care | Payer: Medicare Other | Source: Ambulatory Visit | Attending: Family Medicine | Admitting: Family Medicine

## 2011-10-21 ENCOUNTER — Other Ambulatory Visit: Payer: Self-pay | Admitting: Family Medicine

## 2011-10-21 ENCOUNTER — Encounter: Payer: Self-pay | Admitting: Family Medicine

## 2011-10-21 DIAGNOSIS — R1032 Left lower quadrant pain: Secondary | ICD-10-CM

## 2011-10-21 DIAGNOSIS — G8929 Other chronic pain: Secondary | ICD-10-CM

## 2011-10-21 DIAGNOSIS — N182 Chronic kidney disease, stage 2 (mild): Secondary | ICD-10-CM

## 2011-10-21 DIAGNOSIS — E291 Testicular hypofunction: Secondary | ICD-10-CM

## 2011-10-21 DIAGNOSIS — I10 Essential (primary) hypertension: Secondary | ICD-10-CM

## 2011-10-21 LAB — COMPREHENSIVE METABOLIC PANEL
ALT: 27 U/L (ref 0–53)
AST: 27 U/L (ref 0–37)
Creat: 2.89 mg/dL — ABNORMAL HIGH (ref 0.50–1.35)
Total Bilirubin: 1.7 mg/dL — ABNORMAL HIGH (ref 0.3–1.2)

## 2011-10-21 LAB — POCT URINALYSIS DIPSTICK
Nitrite, UA: NEGATIVE
Protein, UA: >=300
pH, UA: 6

## 2011-10-21 LAB — POCT UA - MICROSCOPIC ONLY: Epithelial cells, urine per micros: <5

## 2011-10-21 MED ORDER — FENTANYL 25 MCG/HR TD PT72: 1.0000 | MEDICATED_PATCH | TRANSDERMAL | Status: DC

## 2011-10-21 MED ORDER — TESTOSTERONE CYPIONATE 200 MG/ML IM SOLN
300.0000 mg | Freq: Once | INTRAMUSCULAR | Status: AC
  Administered 2011-10-21: 300 mg via INTRAMUSCULAR

## 2011-10-21 MED ORDER — OXYCODONE-ACETAMINOPHEN 10-650 MG PO TABS: 1.0000 | ORAL_TABLET | Freq: Four times a day (QID) | ORAL | Status: DC | PRN

## 2011-10-21 MED ORDER — DOXYCYCLINE HYCLATE 100 MG PO CAPS: 100.0000 mg | ORAL_CAPSULE | Freq: Every day | ORAL | Status: DC

## 2011-10-21 NOTE — Assessment & Plan Note (Addendum)
Fentanyl and percocet refilled today. 2 month supply given. Next refill due at end of March (12/21/11).

## 2011-10-21 NOTE — Assessment & Plan Note (Signed)
Checking Cr and K today. Most recent 06/2011 GFR 60. If this declines, will refer to nephrology.

## 2011-10-21 NOTE — Assessment & Plan Note (Signed)
Improved on check today but elevated on home reads. Discussed low salt diet. WIll check BUN and Cr in setting of stage II CKD. Will continue with current regimen.

## 2011-10-21 NOTE — Progress Notes (Signed)
S:  Pt presents today for a general follow up visit with 2 complaints  LLQ Pain: Pt states that he has noticed LLQ pain for the last week. Pt describes pain as feeling of a knot type feeling in LLQ. Pain seems to be worse at night. Pain seems to be alleviated with ambulation in daytime. Pt states that he lifted a TV around this same time frame. No back pain. No hematuria, dysuria. Pt is noted to be on high dose narcotics. Pt states that BMs have been fine. No nausea or vomintig.   Funny smelling urine: Pt states that he has noticed that his urine has smelled "funny" for the last 3-4 months. Pt denies any recent medication changes. No abd pain, dysuria. Pt has had some mild polyuria, though, no polydypsia or polyphagia. Pt states that funny smelling urine has been present well before onset of LLQ pain.  No fevers, no hematuria.   HTN:  Checking BPS Daily ?:yes, once to twice weekly at fathers house  BP Range:SBP- 140s-170s Any HA, CP, SOB?:no Any Medication Side Effects:no Medication Compliance:yes Salt intake?:pt states that he has been working on his salt intake.  Exercise?:walking daily  Weight Loss?: no       O:  Current Outpatient Prescriptions  Medication Sig Dispense Refill  . amLODipine (NORVASC) 10 MG tablet TAKE 1 TABLET BY MOUTH EVERY DAY  30 tablet  6  . aspirin 325 MG tablet Take 325 mg by mouth daily.        [name] buPROPion (WELLBUTRIN SR) 150 MG 12 hr tablet TAKE 1 TABLET BY MOUTH TWICE DAILY  60 tablet  0  . carvedilol (COREG) 25 MG tablet Take 1 tablet (25 mg total) by mouth 2 (two) times daily.  60 tablet  11  . citalopram (CELEXA) 20 MG tablet TAKE 2 TABLETS BY MOUTH DAILY  60 tablet  0  . doxazosin (CARDURA) 2 MG tablet TAKE 1 TABLET EVERY DAY  30 tablet  3  . fentaNYL (DURAGESIC - DOSED MCG/HR) 25 MCG/HR Place 1 patch (25 mcg total) onto the skin every 3 (three) days.  10 patch  0  . gabapentin (NEURONTIN) 300 MG capsule Use 2 tabs daily and 3 tabs nightly  150 capsule   3  . OMEGA 3 1000 MG CAPS Take 2 capsules by mouth 2 (two) times daily.        [name] oxyCODONE-acetaminophen (PERCOCET) 10-650 MG per tablet Take 1 tablet by mouth 4 (four) times daily as needed.  100 tablet  0  . Pantoprazole Sodium (PROTONIX) 40 MG/20 ML PACK Take 40 mg by mouth daily.        [name] testosterone cypionate (DEPOTESTOTERONE CYPIONATE) 200 MG/ML injection Inject 1.5 mLs (300 mg total) into the muscle every 14 (fourteen) days.  10 mL  5  . DISCONTD: oxyCODONE-acetaminophen (PERCOCET) 10-650 MG per tablet Take 1 tablet by mouth 4 (four) times daily as needed.  100 tablet  0  . DISCONTD: oxyCODONE-acetaminophen (PERCOCET) 10-650 MG per tablet Take 1 tablet by mouth 4 (four) times daily as needed.  100 tablet  0  . DOXAZOSIN MESYLATE PO Take 2 mg by mouth daily.        [name] doxycycline (VIBRAMYCIN) 100 MG capsule Take 1 capsule (100 mg total) by mouth daily.  30 capsule  6  . fentaNYL (DURAGESIC - DOSED MCG/HR) 25 MCG/HR Place 1 patch (25 mcg total) onto the skin every 3 (three) days.  10 patch  0  . METOPROLOL  TARTRATE PO Take 100 mg by mouth 2 (two) times daily.       [name] omeprazole (PRILOSEC) 40 MG capsule Take 40 mg by mouth daily.        [name] PATADAY 0.2 % SOLN       . potassium chloride (KLOR-CON) 10 MEQ CR tablet daily please for hypokalemia       . DISCONTD: fentaNYL (DURAGESIC - DOSED MCG/HR) 25 MCG/HR Place 1 patch (25 mcg total) onto the skin every 3 (three) days.  10 patch  0  . DISCONTD: fentaNYL (DURAGESIC - DOSED MCG/HR) 25 MCG/HR Place 1 patch (25 mcg total) onto the skin every 3 (three) days.  10 patch  0   Current Facility-Administered Medications  Medication Dose Route Frequency Provider Last Rate Last Dose  . testosterone cypionate (DEPOTESTOTERONE CYPIONATE) injection 300 mg  300 mg Intramuscular Q14 Days Doree Albee, MD   300 mg at 04/16/11 1340    Wt Readings from Last 3 Encounters:  10/21/11 248 lb (112.492 kg)  07/23/11 240 lb (108.863 kg)  05/21/11 235 lb 3.2 oz  (106.686 kg)   Temp Readings from Last 3 Encounters:  01/12/11 98.2 F (36.8 C) Oral  12/26/10 98 F (36.7 C) Oral   BP Readings from Last 3 Encounters:  10/21/11 107/69  07/23/11 145/98  06/01/11 136/98   Pulse Readings from Last 3 Encounters:  10/21/11 92  07/23/11 73  06/01/11 84    General: alert and cooperative HEENT: PERRLA and extra ocular movement intact Heart: S1, S2 normal, no murmur, rub or gallop, regular rate and rhythm Lungs: clear to auscultation, no wheezes or rales and unlabored breathing Abdomen: abdomen is soft without significant tenderness, masses, organomegaly or guarding; no suprapubic tenderness Extremities: extremities normal, atraumatic, no cyanosis or edema Skin:no rashes Neurology: normal without focal findings, mental status, speech normal, alert and oriented x3, PERLA and reflexes normal and symmetric   A/P:

## 2011-10-21 NOTE — Telephone Encounter (Signed)
Refill request

## 2011-10-21 NOTE — Patient Instructions (Signed)
Muscle Strain A muscle strain, or pulled muscle, occurs when a muscle is over-stretched. A small number of muscle fibers may also be torn. This is especially common in athletes. This happens when a sudden violent force placed on a muscle pushes it past its capacity. Usually, recovery from a pulled muscle takes 1 to 2 weeks. But complete healing will take 5 to 6 weeks. There are millions of muscle fibers. Following injury, your body will usually return to normal quickly. HOME CARE INSTRUCTIONS   While awake, apply ice to the sore muscle for 15 to 20 minutes each hour for the first 2 days. Put ice in a plastic bag and place a towel between the bag of ice and your skin.   Do not use the pulled muscle for several days. Do not use the muscle if you have pain.   You may wrap the injured area with an elastic bandage for comfort. Be careful not to bind it too tightly. This may interfere with blood circulation.   Only take over-the-counter or prescription medicines for pain, discomfort, or fever as directed by your caregiver. Do not use aspirin as this will increase bleeding (bruising) at injury site.   Warming up before exercise helps prevent muscle strains.  SEEK MEDICAL CARE IF:  There is increased pain or swelling in the affected area. MAKE SURE YOU:   Understand these instructions.   Will watch your condition.   Will get help right away if you are not doing well or get worse.  Document Released: 09/21/2005 Document Revised: 06/03/2011 Document Reviewed: 04/20/2007 [employer] Patient Information 2012 [employer]. 

## 2011-10-21 NOTE — Assessment & Plan Note (Signed)
DDx includes constipation, muscle strain, kidney stones, UTI. Will check KUB as well as UA (in setting of ?smelly urine). Handout on MSK strain given. No red flags currently. Will treat pending work up.

## 2011-10-22 ENCOUNTER — Telehealth: Payer: Self-pay | Admitting: Family Medicine

## 2011-10-22 DIAGNOSIS — N390 Urinary tract infection, site not specified: Secondary | ICD-10-CM

## 2011-10-22 MED ORDER — CIPROFLOXACIN HCL 500 MG PO TABS: 500.0000 mg | ORAL_TABLET | Freq: Two times a day (BID) | ORAL | Status: AC

## 2011-10-22 NOTE — Telephone Encounter (Signed)
Called and left VM for pt that he likely has an infected kidney stone. Discussed with pt that he will need to come back in for formal urine culture as well as CBC w/ diff.   Will start pt on keflex for UTI tx AFTER urine cx and CBC are obtained.   Will leave rx for abx a front desk when he comes in for lab studies.   Appt has been made with [facility] for 10/23/11 @ 1PM. Pt will need to be there at 12:45.   Please attempt to contact pt as this is an urgent issue.  Thank you,

## 2011-10-22 NOTE — Telephone Encounter (Signed)
Called and left another VM for (phone went straight to VM) that pt has appt with [facility] tomorrow at 1 pm. Reitirated importance of urine and blood draws today. As well as need for abx.   Also called emergency contact and spoke with pt's mother. Told her to have pt call clinic ASAP to discuss lab results. Mom agreeable.   Correction: Abx changed from keflex to cipro in setting of PCN allergy (hives).

## 2011-10-22 NOTE — Telephone Encounter (Signed)
Waiting for call back. Left message to return call.  [name], Renato Battles

## 2011-10-23 ENCOUNTER — Telehealth: Payer: Self-pay | Admitting: Family Medicine

## 2011-10-23 ENCOUNTER — Other Ambulatory Visit: Payer: Medicare Other

## 2011-10-23 DIAGNOSIS — R1032 Left lower quadrant pain: Secondary | ICD-10-CM

## 2011-10-23 DIAGNOSIS — N182 Chronic kidney disease, stage 2 (mild): Secondary | ICD-10-CM

## 2011-10-23 LAB — CBC WITH DIFFERENTIAL/PLATELET
Basophils Absolute: 0 10*3/uL (ref 0.0–0.1)
HCT: 39.2 % (ref 39.0–52.0)
Hemoglobin: 13.5 g/dL (ref 13.0–17.0)
Lymphocytes Relative: 12 % (ref 12–46)
Monocytes Absolute: 1.1 10*3/uL — ABNORMAL HIGH (ref 0.1–1.0)
Monocytes Relative: 9 % (ref 3–12)
Neutro Abs: 8.9 10*3/uL — ABNORMAL HIGH (ref 1.7–7.7)
RBC: 3.99 MIL/uL — ABNORMAL LOW (ref 4.22–5.81)
WBC: 11.6 10*3/uL — ABNORMAL HIGH (ref 4.0–10.5)

## 2011-10-23 NOTE — Telephone Encounter (Signed)
Called and contacted pt about lab and imaging results. Told pt to come to clinic for lab draws as well as antibiotics. Discussed with pt importance of follow up.Pt states that he tried to call this am and could not get through. Instructed pt that we opened late because of snow. Told pt that he had an appt with urology at 1pm today. Told pt that I would contact urology to try an reschedule appt.. Discussed infectious red flags at length including fever, nausea, vomiting, and abdominal pain.    Addendum: Spoke with [name] from [facility] [phone]). She stated to have someone from our clinic contact urology once nurse visit is done to possibly set up appt. Pt would have to be possibly worked in.

## 2011-10-23 NOTE — Progress Notes (Signed)
CBC WITH DIFF AND URINE CX DONE TODAY Jeffrey [name]

## 2011-10-23 NOTE — Telephone Encounter (Signed)
Pt came in for labs. Jeffrey [name] called [facility]. [name] was unavailable. [name] Maduro were on the phone for over 15 minutes without any answer of working the pt in today. They said that they would call back. I gave pt information (printed) and asked him to drive over there and tell them, that Dr.Newton spoke with [facility] today and was told, that they would work him in. Rx for ABX given to pt. Fwd. To Dr.Newton for info. [name], Renato Battles

## 2011-10-25 LAB — CULTURE, URINE COMPREHENSIVE: Colony Count: 50000

## 2011-10-26 ENCOUNTER — Telehealth: Payer: Self-pay | Admitting: Family Medicine

## 2011-10-26 NOTE — Telephone Encounter (Signed)
Message copied by Floydene Flock on Mon Oct 26, 2011 10:32 AM ------      Message from: Denny Levy L      Created: Mon Oct 26, 2011  8:44 AM                   ----- Message -----         From: [facility] Interface         Sent: 10/23/2011  11:27 PM           To: Denny Levy, MD

## 2011-10-26 NOTE — Telephone Encounter (Signed)
Called to follow up with pt about treatment for infected kidney stone. Pt states that he was unable to see urology last week Friday. Pt states that he has an appt tomorrow. Pt states that he has been taking abx.  Pt states that he has had some nausea, and mild chills, though these were improved from last week. No fevers. Instructed pt to continue with abx and to call or go to ED if his sxs worsen. Pt agreeable.

## 2011-10-30 ENCOUNTER — Encounter: Payer: Self-pay | Admitting: Family Medicine

## 2011-10-30 DIAGNOSIS — N200 Calculus of kidney: Secondary | ICD-10-CM | POA: Insufficient documentation

## 2011-11-04 ENCOUNTER — Ambulatory Visit: Payer: Medicare Other

## 2011-11-04 ENCOUNTER — Ambulatory Visit: Payer: Medicare Other | Admitting: Family Medicine

## 2011-11-12 ENCOUNTER — Encounter (HOSPITAL_COMMUNITY): Payer: Self-pay | Admitting: *Deleted

## 2011-11-12 NOTE — Pre-Procedure Instructions (Signed)
Patient instructed to bring blue folder, picture ID, driver, insurance information to [facility] on day of litho. To follow laxative instructions in blue folder. To arrive in [facility] at 0645 am. To avoid taking any aspirin , Ibuprofen etc until after the litho, to be NPO after midnight.patient verbalized understanding of instructions.

## 2011-11-13 NOTE — Progress Notes (Signed)
Spoke with Macon Large at [facility] regarding pt's history of murmur and stroke with rt arm numbness residual.  Bjorn Loser stated we do not need an EKG on this pt.

## 2011-11-17 ENCOUNTER — Encounter (HOSPITAL_COMMUNITY): Payer: Self-pay | Admitting: Pharmacy Technician

## 2011-11-18 ENCOUNTER — Other Ambulatory Visit: Payer: Self-pay | Admitting: Urology

## 2011-11-18 ENCOUNTER — Other Ambulatory Visit: Payer: Self-pay | Admitting: Family Medicine

## 2011-11-18 NOTE — Telephone Encounter (Signed)
Refill request

## 2011-11-18 NOTE — H&P (Signed)
Chief Complaint  Seen today as a referral from Dr. Kathie Rhodes. [place] for evaluation of chronic LLQ pain.   Active Problems Problems  1. Abdominal Pain In The Left Lower Belly (LLQ) 789.04 2. Nephrolithiasis Of The Left Kidney 592.0 3. Urinary Tract Infection 599.0  History of Present Illness     51 YO male patient of Dr. Belva Crome seen today as a referral from Dr. Kathie Rhodes. [place] for evaluation of chronic LLQ pain and ? right renal calculus seen in [facility] 10/21/11. Has had several months of foul smelling urine and LLQ pain. Has h/o chronic pain that is controlled with Fentanyl 25 mcg patch and PRN oxycodone 10/650 mg 1 po QID PRN.   Interval HX: States that pain today is 3/10 with max pain several days ago 10/10. Feels that foul smelling urine is much improved with Cipro 500 mg 1 po BID. Denies fever/chills, or dysuria, but states that urine has been "brown" colored for several days.   Past Medical History Problems  1. History of  Abdominal Pain In The Right Lower Belly (RLQ) 789.03 2. History of  Anxiety (Symptom) 300.00 3. History of  Depression 311 4. History of  Esophageal Reflux 530.81 5. History of  Hypercholesterolemia 272.0 6. History of  Hypertension 401.9 7. History of  Lumbar Disc Degeneration 722.52 8. History of  Murmurs 785.2 9. History of  Patent Foramen Ovale 745.5 10. History of  Renal Failure 586 11. History of  Sleep Apnea 780.57 12. History of  Stroke Syndrome 436 13. History of  Venous Thrombosis V12.51  Surgical History Problems  1. History of  Interruption Inferior Vena Cava [place] Filter Placement 2. History of  Knee Surgery Right 3. History of  Total Hip Replacement  Current Meds 1. Aspirin 325 MG Oral Tablet; Therapy: (Recorded:26Oct2010) to 2. CeleXA 40 MG Oral Tablet; Therapy: (Recorded:26Oct2010) to 3. Doxazosin Mesylate 2 MG Oral Tablet; Therapy: (Recorded:26Oct2010) to 4. FentaNYL 25 MCG/HR Transdermal Patch 72 Hour; Therapy: (Recorded:26Oct2010) to 5. Fish  Oil CAPS; Therapy: (Recorded:26Oct2010) to 6. Metoprolol Succinate ER 100 MG Oral Tablet Extended Release 24 Hour; Therapy:  (Recorded:26Oct2010) to 7. Oxycodone-Acetaminophen 10-650 MG Oral Tablet; Therapy: (Recorded:26Oct2010) to 8. Protonix 40 MG Oral Packet; Therapy: (Recorded:26Oct2010) to 9. Testosterone Cypionate OIL; Therapy: (Recorded:26Oct2010) to  Allergies Medication  1. Penicillins  Family History Problems  1. Paternal history of  Family Health Status - Father's Age 48 yrs old 2. Maternal history of  Family Health Status - Mother's Age 70 yrs old 3. Family history of  Nephrolithiasis 4. Family history of  Prostate Cancer V16.42 5. Family history of  Renal Failure  Social History Problems  1. Caffeine Use 3 per day 2. Marital History - Separated 3. Physical Disability: 4. Tobacco Use V15.82 Smokes 1/2 ppd for 30 yrs Denied  5. History of  Alcohol Use  Review of Systems Genitourinary, constitutional, skin, eye, otolaryngeal, hematologic/lymphatic, cardiovascular, pulmonary, endocrine, musculoskeletal, gastrointestinal, neurological and psychiatric system(s) were reviewed and pertinent findings if present are noted.  Genitourinary: urinary frequency, nocturia and urinary stream starts and stops.  Gastrointestinal: abdominal pain and heartburn.  Constitutional: night sweats and feeling tired (fatigue).  Eyes: blurred vision.  ENT: sinus problems.  Hematologic/Lymphatic: a tendency to easily bruise.  Respiratory: shortness of breath and cough.  Endocrine: polydipsia.  Musculoskeletal: back pain and joint pain.  Psychiatric: anxiety and depression.    Vitals Vital Signs [Data Includes: Last 1 Day]  22Jan2013 09:52AM  BMI Calculated: 33.59 BSA Calculated: 2.34 Height: 6 ft  Weight: 248 lb  Blood Pressure: 111 / 69 Temperature: 97 F Heart Rate: 93  Physical Exam Constitutional: Well nourished and well developed . No acute distress. The patient appears  well hydrated.  ENT:. The ears and nose are normal in appearance.  Neck: The appearance of the neck is normal.  Pulmonary: No respiratory distress.  Cardiovascular: Heart rate and rhythm are normal.  Abdomen: The abdomen is rounded. The abdomen is soft and nontender. No suprapubic tenderness. No CVA tenderness. No hernias are palpable.  Rectal: Rectal exam demonstrates normal sphincter tone, the anus is normal on inspection. and no tenderness. Estimated prostate size is 2+. Normal rectal tone, no rectal masses, prostate is smooth, symmetric and non-tender. The prostate has no nodularity, is not indurated, is not tender and is not fluctuant. The perineum is normal on inspection, no perineal tenderness.  Genitourinary: Examination of the penis demonstrates a normal meatus. The penis is circumcised. The scrotum is normal in appearance. The right testis is palpably normal, not enlarged and non-tender. The left testis is normal, not enlarged and non-tender.  Lymphatics: The femoral and inguinal nodes are not enlarged or tender.  Skin: Normal skin turgor and normal skin color and pigmentation.  Neuro/Psych:. Mood and affect are appropriate.    Results/Data Urine [Data Includes: Last 1 Day]   22Jan2013  COLOR ORANGE   APPEARANCE CLEAR   SPECIFIC GRAVITY 1.020   pH 6.0   GLUCOSE NEG mg/dL  BILIRUBIN SMALL   KETONE NEG mg/dL  BLOOD TRACE   PROTEIN NEG mg/dL  UROBILINOGEN 1 mg/dL  NITRITE NEG   LEUKOCYTE ESTERASE TRACE   SQUAMOUS EPITHELIAL/HPF RARE   WBC 7-10 WBC/hpf  RBC NONE SEEN RBC/hpf  BACTERIA NONE SEEN   CRYSTALS NONE SEEN   CASTS NONE SEEN    Old records or history reviewed: Reviewed OV notes from referring MD.  The following images/tracing/specimen were independently visualized:  CTU: left nonobstructing 8 mm renal calculus. No hydronephrosis noted. Difficult to follow ureters to bladder due to artifact from bilateral hip replacements. Does have diverticula but no stranding along  colon.  The following clinical lab reports were reviewed:  UA C&S.  The following radiology reports were reviewed: Reviewed [facility] 10/21/11: Larina Bras is clearly seen in left renal pelvis. Selected Results  AU CT-STONE PROTOCOL 22Jan2013 12:00AM Jetta Lout   Test Name Result Flag Reference  ** RADIOLOGY REPORT BY [facility] ** ORIGINAL APPROVED BY: Donavan Burnet, M.D. ON: 10/27/2011 15:08:33   *RADIOLOGY REPORT*  Clinical Data: Renal calculus  CT OF THE ABDOMEN AND PELVIS WITHOUT CONTRAST (CT UROGRAM)  Technique: Multidetector CT imaging was performed through the abdomen and pelvis to include the urinary tract.  Comparison: None  Findings: 8 mm calculus in the left renal pelvis. Just inferior and lateral to the calculus, there is a 1.7 x 2.1 cm fluid density area representing a pelvic cysts or a dilated calix.  No evidence of right nephrolithiasis. No ureteral calculus.  There is severe atrophy of the left iliopsoas muscle.  Tiny bilateral pleural effusions.  The visualized portions of the gallbladder, spleen, pancreas, adrenal glands are within normal limits. Sub centimeter nonspecific hypodensity in the right lobe of the liver on image 10.  An infrarenal IVC filter is in place. Several legs protrude well beyond the lumen of the IVC and two of them are adjacent to the L2 vertebral body.  The bladder is obscured by streak artifact from the total hip arthroplasties.  Degenerative changes and spinal  stenosis in the lumbar spine.  No obvious abnormal adenopathy or free fluid.  IMPRESSION: Left nephrolithiasis.  Dilated calix versus pelvic cysts in the left kidney. Contrast enhanced study would be helpful.  Abnormal orientation of the legs of the infrarenal IVC filter as described above. Correlate with symptomatology.  Small bilateral pleural effusions.   Assessment Assessed  1. Abdominal Pain In The Left Lower Belly (LLQ) 789.04 2. Nephrolithiasis Of  The Left Kidney 592.0 3. Urinary Tract Infection 599.0  Plan  Nephrolithiasis Of Both Kidneys (592.0)  1. Ketorolac Tromethamine 60 MG/2ML Injection Solution; INJECT 60  MG Intramuscular; Done:  22Jan2013 10:32AM; Status: COMPLETE Urinary Tract Infection (599.0)  2. URINE CULTURE  Done: 22Jan2013         1. Culture urine. Will complete Cipro as RX'd 2. Ketorolac 60 mg IM  3. Discuss ESWL vs cystoureterscopy, L RPG, stone extraction. Wishes to proceed with (L) ESWL. Risks and benefits discussed. Risks: infection, hematoma, or stone may fracture into several small fragments to pass. He understands that he may also need 2nd procedure if stones are not passable and he develops acute pain or fever/chills. Voices understanding. Wishes to proceed. All questions addressed and answered to pt's satisification.  AU CT-STONE PROTOCOL  Status: Resulted - Requires Verification  Done: 01Jan0001 12:00AM Ordered Today; For: Nephrolithiasis Of Both Kidneys (592.0); Ordered By: Jetta Lout  Due: 24Jan2013 Marked Important; Last Updated By: Teddy Spike

## 2011-11-19 ENCOUNTER — Ambulatory Visit (HOSPITAL_COMMUNITY): Payer: Medicare Other

## 2011-11-19 ENCOUNTER — Ambulatory Visit (HOSPITAL_COMMUNITY)
Admission: RE | Admit: 2011-11-19 | Discharge: 2011-11-19 | Payer: Medicare Other | Source: Ambulatory Visit | Attending: Urology | Admitting: Urology

## 2011-11-19 ENCOUNTER — Ambulatory Visit (HOSPITAL_BASED_OUTPATIENT_CLINIC_OR_DEPARTMENT_OTHER): Payer: Medicare Other

## 2011-11-19 ENCOUNTER — Ambulatory Visit: Admit: 2011-11-19 | Payer: Self-pay | Admitting: Urology

## 2011-11-19 ENCOUNTER — Encounter (HOSPITAL_COMMUNITY)
Admission: RE | Disposition: A | Discharge status: Other Acute Inpatient Hospital | Payer: Self-pay | Source: Ambulatory Visit | Attending: Urology

## 2011-11-19 ENCOUNTER — Encounter (HOSPITAL_COMMUNITY): Payer: Self-pay | Admitting: *Deleted

## 2011-11-19 ENCOUNTER — Other Ambulatory Visit: Payer: Self-pay | Admitting: Urology

## 2011-11-19 ENCOUNTER — Other Ambulatory Visit: Payer: Self-pay

## 2011-11-19 ENCOUNTER — Encounter (HOSPITAL_BASED_OUTPATIENT_CLINIC_OR_DEPARTMENT_OTHER)
Admission: RE | Disposition: A | Discharge status: Home / Self Care | Payer: Self-pay | Source: Ambulatory Visit | Attending: Urology

## 2011-11-19 ENCOUNTER — Ambulatory Visit (HOSPITAL_BASED_OUTPATIENT_CLINIC_OR_DEPARTMENT_OTHER): Admission: RE | Admit: 2011-11-19 | Payer: Medicare Other | Source: Ambulatory Visit | Admitting: Urology

## 2011-11-19 ENCOUNTER — Encounter (HOSPITAL_BASED_OUTPATIENT_CLINIC_OR_DEPARTMENT_OTHER): Payer: Self-pay

## 2011-11-19 ENCOUNTER — Ambulatory Visit (HOSPITAL_BASED_OUTPATIENT_CLINIC_OR_DEPARTMENT_OTHER)
Admission: RE | Admit: 2011-11-19 | Discharge: 2011-11-19 | Disposition: A | Discharge status: Home / Self Care | Payer: Medicare Other | Source: Ambulatory Visit | Attending: Urology | Admitting: Urology

## 2011-11-19 DIAGNOSIS — M51379 Other intervertebral disc degeneration, lumbosacral region without mention of lumbar back pain or lower extremity pain: Secondary | ICD-10-CM | POA: Insufficient documentation

## 2011-11-19 DIAGNOSIS — M5137 Other intervertebral disc degeneration, lumbosacral region: Secondary | ICD-10-CM | POA: Insufficient documentation

## 2011-11-19 DIAGNOSIS — N201 Calculus of ureter: Secondary | ICD-10-CM | POA: Insufficient documentation

## 2011-11-19 DIAGNOSIS — Z01812 Encounter for preprocedural laboratory examination: Secondary | ICD-10-CM | POA: Insufficient documentation

## 2011-11-19 DIAGNOSIS — Q2111 Secundum atrial septal defect: Secondary | ICD-10-CM | POA: Insufficient documentation

## 2011-11-19 DIAGNOSIS — R1032 Left lower quadrant pain: Secondary | ICD-10-CM | POA: Insufficient documentation

## 2011-11-19 DIAGNOSIS — Z96649 Presence of unspecified artificial hip joint: Secondary | ICD-10-CM | POA: Insufficient documentation

## 2011-11-19 DIAGNOSIS — Z79899 Other long term (current) drug therapy: Secondary | ICD-10-CM | POA: Insufficient documentation

## 2011-11-19 DIAGNOSIS — Z86718 Personal history of other venous thrombosis and embolism: Secondary | ICD-10-CM | POA: Insufficient documentation

## 2011-11-19 DIAGNOSIS — Q211 Atrial septal defect: Secondary | ICD-10-CM | POA: Insufficient documentation

## 2011-11-19 DIAGNOSIS — F329 Major depressive disorder, single episode, unspecified: Secondary | ICD-10-CM | POA: Insufficient documentation

## 2011-11-19 DIAGNOSIS — Z5309 Procedure and treatment not carried out because of other contraindication: Secondary | ICD-10-CM | POA: Insufficient documentation

## 2011-11-19 DIAGNOSIS — N390 Urinary tract infection, site not specified: Secondary | ICD-10-CM | POA: Insufficient documentation

## 2011-11-19 DIAGNOSIS — R011 Cardiac murmur, unspecified: Secondary | ICD-10-CM | POA: Insufficient documentation

## 2011-11-19 DIAGNOSIS — K219 Gastro-esophageal reflux disease without esophagitis: Secondary | ICD-10-CM | POA: Insufficient documentation

## 2011-11-19 DIAGNOSIS — N200 Calculus of kidney: Secondary | ICD-10-CM | POA: Insufficient documentation

## 2011-11-19 DIAGNOSIS — I10 Essential (primary) hypertension: Secondary | ICD-10-CM | POA: Insufficient documentation

## 2011-11-19 DIAGNOSIS — F411 Generalized anxiety disorder: Secondary | ICD-10-CM | POA: Insufficient documentation

## 2011-11-19 DIAGNOSIS — Z01818 Encounter for other preprocedural examination: Secondary | ICD-10-CM | POA: Insufficient documentation

## 2011-11-19 DIAGNOSIS — E7800 Pure hypercholesterolemia, unspecified: Secondary | ICD-10-CM | POA: Insufficient documentation

## 2011-11-19 DIAGNOSIS — Z7982 Long term (current) use of aspirin: Secondary | ICD-10-CM | POA: Insufficient documentation

## 2011-11-19 DIAGNOSIS — Z8673 Personal history of transient ischemic attack (TIA), and cerebral infarction without residual deficits: Secondary | ICD-10-CM | POA: Insufficient documentation

## 2011-11-19 DIAGNOSIS — N35919 Unspecified urethral stricture, male, unspecified site: Secondary | ICD-10-CM | POA: Insufficient documentation

## 2011-11-19 DIAGNOSIS — N12 Tubulo-interstitial nephritis, not specified as acute or chronic: Secondary | ICD-10-CM | POA: Insufficient documentation

## 2011-11-19 DIAGNOSIS — F3289 Other specified depressive episodes: Secondary | ICD-10-CM | POA: Insufficient documentation

## 2011-11-19 DIAGNOSIS — Z01810 Encounter for preprocedural cardiovascular examination: Secondary | ICD-10-CM | POA: Insufficient documentation

## 2011-11-19 HISTORY — DX: Depression, unspecified: F32.A

## 2011-11-19 HISTORY — DX: Gastro-esophageal reflux disease without esophagitis: K21.9

## 2011-11-19 HISTORY — DX: Sleep apnea, unspecified: G47.30

## 2011-11-19 HISTORY — DX: Cardiac murmur, unspecified: R01.1

## 2011-11-19 HISTORY — DX: Calculus of kidney: N20.0

## 2011-11-19 HISTORY — DX: Major depressive disorder, single episode, unspecified: F32.9

## 2011-11-19 HISTORY — DX: Essential (primary) hypertension: I10

## 2011-11-19 HISTORY — DX: Cerebral infarction, unspecified: I63.9

## 2011-11-19 LAB — URINE MICROSCOPIC-ADD ON

## 2011-11-19 LAB — URINALYSIS, ROUTINE W REFLEX MICROSCOPIC
Hgb urine dipstick: NEGATIVE
Nitrite: NEGATIVE
Specific Gravity, Urine: 1.016 (ref 1.005–1.030)
Urobilinogen, UA: 0.2 mg/dL (ref 0.0–1.0)

## 2011-11-19 LAB — POCT I-STAT 4, (NA,K, GLUC, HGB,HCT)
Glucose, Bld: 104 mg/dL — ABNORMAL HIGH (ref 70–99)
Potassium: 4.6 mEq/L (ref 3.5–5.1)
Sodium: 136 mEq/L (ref 135–145)

## 2011-11-19 SURGERY — CYSTOSCOPY, WITH STENT INSERTION
Anesthesia: General | Laterality: Left

## 2011-11-19 SURGERY — LITHOTRIPSY, ESWL
Anesthesia: LOCAL | Laterality: Left

## 2011-11-19 SURGERY — CYSTOURETEROSCOPY, WITH RETROGRADE PYELOGRAM AND STENT INSERTION
Anesthesia: General | Site: Ureter | Laterality: Left | Wound class: Clean Contaminated

## 2011-11-19 MED ORDER — DIAZEPAM 5 MG PO TABS
ORAL_TABLET | ORAL | Status: AC
  Administered 2011-11-19: 10 mg via ORAL
  Filled 2011-11-19: qty 2

## 2011-11-19 MED ORDER — FENTANYL CITRATE 0.05 MG/ML IJ SOLN: 25.0000 ug | INTRAMUSCULAR | Status: DC | PRN

## 2011-11-19 MED ORDER — SODIUM CHLORIDE 0.9 % IR SOLN
Status: DC | PRN
  Administered 2011-11-19: 3000 mL

## 2011-11-19 MED ORDER — OXYCODONE-ACETAMINOPHEN 10-650 MG PO TABS: 1.0000 | ORAL_TABLET | Freq: Four times a day (QID) | ORAL | Status: DC | PRN

## 2011-11-19 MED ORDER — DIPHENHYDRAMINE HCL 25 MG PO CAPS
25.0000 mg | ORAL_CAPSULE | ORAL | Status: AC
  Administered 2011-11-19: 25 mg via ORAL

## 2011-11-19 MED ORDER — ACETAMINOPHEN 325 MG PO TABS: 650.0000 mg | ORAL_TABLET | ORAL | Status: DC | PRN

## 2011-11-19 MED ORDER — PHENAZOPYRIDINE HCL 200 MG PO TABS
200.0000 mg | ORAL_TABLET | Freq: Once | ORAL | Status: AC
  Administered 2011-11-19: 100 mg via ORAL

## 2011-11-19 MED ORDER — ONDANSETRON HCL 4 MG/2ML IJ SOLN
INTRAMUSCULAR | Status: DC | PRN
  Administered 2011-11-19: 4 mg via INTRAVENOUS

## 2011-11-19 MED ORDER — KETOROLAC TROMETHAMINE 30 MG/ML IJ SOLN
INTRAMUSCULAR | Status: DC | PRN
  Administered 2011-11-19: 30 mg via INTRAVENOUS

## 2011-11-19 MED ORDER — PROMETHAZINE HCL 25 MG/ML IJ SOLN: 12.5000 mg | Freq: Four times a day (QID) | INTRAMUSCULAR | Status: DC | PRN

## 2011-11-19 MED ORDER — SODIUM CHLORIDE 0.9 % IJ SOLN: 3.0000 mL | INTRAMUSCULAR | Status: DC | PRN

## 2011-11-19 MED ORDER — SODIUM CHLORIDE 0.9 % IV SOLN: 250.0000 mL | INTRAVENOUS | Status: DC | PRN

## 2011-11-19 MED ORDER — PROMETHAZINE HCL 25 MG/ML IJ SOLN: 6.2500 mg | INTRAMUSCULAR | Status: DC | PRN

## 2011-11-19 MED ORDER — ACETAMINOPHEN 650 MG RE SUPP: 650.0000 mg | RECTAL | Status: DC | PRN

## 2011-11-19 MED ORDER — FENTANYL CITRATE 0.05 MG/ML IJ SOLN
INTRAMUSCULAR | Status: DC | PRN
  Administered 2011-11-19 (×2): 50 ug via INTRAVENOUS

## 2011-11-19 MED ORDER — DEXTROSE-NACL 5-0.45 % IV SOLN
INTRAVENOUS | Status: DC
  Administered 2011-11-19: 07:00:00 via INTRAVENOUS

## 2011-11-19 MED ORDER — OXYCODONE HCL 5 MG PO TABS: 5.0000 mg | ORAL_TABLET | ORAL | Status: DC | PRN

## 2011-11-19 MED ORDER — MIDAZOLAM HCL 5 MG/5ML IJ SOLN
INTRAMUSCULAR | Status: DC | PRN
  Administered 2011-11-19: 2 mg via INTRAVENOUS

## 2011-11-19 MED ORDER — SODIUM CHLORIDE 0.9 % IJ SOLN: 3.0000 mL | Freq: Two times a day (BID) | INTRAMUSCULAR | Status: DC

## 2011-11-19 MED ORDER — PROPOFOL 10 MG/ML IV EMUL
INTRAVENOUS | Status: DC | PRN
  Administered 2011-11-19: 250 mg via INTRAVENOUS

## 2011-11-19 MED ORDER — CIPROFLOXACIN HCL 500 MG PO TABS
ORAL_TABLET | ORAL | Status: AC
  Administered 2011-11-19: 500 mg via ORAL
  Filled 2011-11-19: qty 1

## 2011-11-19 MED ORDER — ONDANSETRON HCL 4 MG/2ML IJ SOLN: 4.0000 mg | Freq: Four times a day (QID) | INTRAMUSCULAR | Status: DC | PRN

## 2011-11-19 MED ORDER — PHENAZOPYRIDINE HCL 200 MG PO TABS: 200.0000 mg | ORAL_TABLET | Freq: Three times a day (TID) | ORAL | Status: AC | PRN

## 2011-11-19 MED ORDER — LIDOCAINE HCL 2 % EX GEL
CUTANEOUS | Status: DC | PRN
  Administered 2011-11-19: 1

## 2011-11-19 MED ORDER — LACTATED RINGERS IV SOLN
INTRAVENOUS | Status: DC
  Administered 2011-11-19: 10:00:00 via INTRAVENOUS

## 2011-11-19 MED ORDER — DIAZEPAM 5 MG PO TABS
10.0000 mg | ORAL_TABLET | ORAL | Status: AC
  Administered 2011-11-19: 10 mg via ORAL

## 2011-11-19 MED ORDER — DIPHENHYDRAMINE HCL 25 MG PO CAPS
ORAL_CAPSULE | ORAL | Status: AC
  Administered 2011-11-19: 25 mg via ORAL
  Filled 2011-11-19: qty 1

## 2011-11-19 MED ORDER — BELLADONNA ALKALOIDS-OPIUM 16.2-60 MG RE SUPP
RECTAL | Status: DC | PRN
  Administered 2011-11-19: 1 via RECTAL

## 2011-11-19 MED ORDER — URIBEL 118 MG PO CAPS: 118.0000 mg | ORAL_CAPSULE | Freq: Four times a day (QID) | ORAL | Status: DC | PRN

## 2011-11-19 MED ORDER — CIPROFLOXACIN HCL 250 MG PO TABS: 500.0000 mg | ORAL_TABLET | Freq: Two times a day (BID) | ORAL | Status: AC

## 2011-11-19 MED ORDER — DEXAMETHASONE SODIUM PHOSPHATE 4 MG/ML IJ SOLN
INTRAMUSCULAR | Status: DC | PRN
  Administered 2011-11-19: 10 mg via INTRAVENOUS

## 2011-11-19 MED ORDER — LIDOCAINE HCL (CARDIAC) 20 MG/ML IV SOLN
INTRAVENOUS | Status: DC | PRN
  Administered 2011-11-19: 100 mg via INTRAVENOUS

## 2011-11-19 MED ORDER — CIPROFLOXACIN HCL 500 MG PO TABS
500.0000 mg | ORAL_TABLET | ORAL | Status: AC
  Administered 2011-11-19: 500 mg via ORAL

## 2011-11-19 SURGICAL SUPPLY — 15 items
CLOTH BEACON ORANGE TIMEOUT ST (SAFETY) ×2 IMPLANT
DRAPE CAMERA CLOSED 9X96 (DRAPES) ×2 IMPLANT
GLOVE BIOGEL PI IND STRL 6.5 (GLOVE) ×1 IMPLANT
GLOVE BIOGEL PI IND STRL 8 (GLOVE) ×1 IMPLANT
GLOVE BIOGEL PI INDICATOR 6.5 (GLOVE) ×1
GLOVE BIOGEL PI INDICATOR 8 (GLOVE) ×1
GLOVE SS BIOGEL STRL SZ 8 (GLOVE) ×1 IMPLANT
GLOVE SUPERSENSE BIOGEL SZ 8 (GLOVE) ×1
GOWN PREVENTION PLUS XLARGE (GOWN DISPOSABLE) ×2 IMPLANT
GOWN PREVENTION PLUS XXLARGE (GOWN DISPOSABLE) ×2 IMPLANT
GOWN STRL NON-REIN LRG LVL3 (GOWN DISPOSABLE) ×2 IMPLANT
GUIDEWIRE STR DUAL SENSOR (WIRE) ×2 IMPLANT
IV NS IRRIG 3000ML ARTHROMATIC (IV SOLUTION) ×2 IMPLANT
PACK CYSTOSCOPY (CUSTOM PROCEDURE TRAY) ×2 IMPLANT
STENT URET 6FRX26 CONTOUR (STENTS) ×2 IMPLANT

## 2011-11-19 NOTE — Progress Notes (Signed)
Dr. Annabell Howells stated he wants to make sure urine culture is done on patient's urine from this am. Urine was sent prior to p.o. Antibiotic given. Spoke to Friant in lab at 0900 and she stated the urine culture has been ordered and is being run. Patient being taken to [facility] now for cysto. MD has been to speak to patient and patient verbalized understanding.

## 2011-11-19 NOTE — Discharge Instructions (Addendum)
Ureteral Stent A ureteral stent is a soft plastic tube with multiple holes. The stent is inserted into a ureter to help drain urine from the kidney into the bladder. The tube has a coil on each end to keep it from falling out. One end stays in the kidney. The other end stays in the bladder. A stent cannot be seen from the outside. Usually it does not keep you from going about normal routines. A ureteral stent is used to bypass a blockage in your kidney or ureter. This blockage can be caused by kidney stones, scar tissue, pregnancy, or other causes. It can also be used during treatment to remove a kidney stone or to let a ureter heal after surgery. The stent allows urine to drain from the kidney into the bladder. It is most often taken out after the blockage has been removed or the ureter has healed. If a stent is needed for a long time, it will be changed every few months. INSERTING THE STENT Your stent is put in by a urologist. This is a medical doctor trained for treating genitourinary (kidney, ureter and bladder) problems. Before your stent is put in, your caregiver may order x-rays or other imaging tests of your kidneys and ureters. The stent is inserted in a hospital or same day surgical center. You can anticipate going home the same day. PROCEDURE  A special x-ray machine called a fluoroscope is used to guide the insertion of your stent. This allows your doctor to make sure the stent is in the correct place.   First you are given anesthesia to keep you comfortable.   Then your doctor inserts a special lighted instrument called a cystoscope into your bladder. This allows your doctor to see the opening to the ureter.   A thin wire is carefully threaded into the bladder and up the ureter. The stent is inserted over the wire and the wire is then removed.  HOME CARE INSTRUCTIONS   While the stent is in place, you may feel some discomfort. Certain movements may trigger pain or a feeling that you need  to urinate. Your caregiver may give you pain medication. Only take over-the-counter or prescription medicines for pain, discomfort, or fever as directed by your caregiver. Do not take aspirin as this can make bleeding worse.   You may be given medications to prevent infection or bladder spasms. Be sure to take all medications as directed.   Drink plenty of fluids.   You may have small amounts of bleeding causing your urine to be slightly red. This is nothing to be concerned about.  REMOVAL OF THE STENT Your stent is left in until the blockage is resolved. This may take two weeks or longer. Before the stent is removed, you may have an x-ray make sure the ureter is open. The stent can be removed by your caregiver in the office. Medications may be given for comfort. Be sure to keep all follow-up appointments so your caregiver can check that you are healing properly. SEEK IMMEDIATE MEDICAL CARE IF:   Your urine is dark red or has blood clots.   You are incontinent (leaking urine).   You have an oral temperature above 102 F (38.9 C), chills, nausea (feeling sick to your stomach), or vomiting.   Your pain is not relieved by pain medication. Do not take aspirin as this can make bleeding worse.   The end of the stent comes out of the urethra.  Document Released: 09/18/2000 Document Revised:   06/03/2011 Document Reviewed: 09/17/2008 Exit[employer] Patient Information 2012 [employer]. 

## 2011-11-19 NOTE — Telephone Encounter (Signed)
Refill request

## 2011-11-19 NOTE — Transfer of Care (Signed)
Immediate Anesthesia Transfer of Care Note  Patient: Jeffrey [name]  Procedure(s) Performed: Procedure(s) (LRB): CYSTOSCOPY WITH RETROGRADE PYELOGRAM, URETEROSCOPY AND STENT PLACEMENT (Left)  Patient Location: PACU  Anesthesia Type: General  Level of Consciousness: awake, alert  and oriented  Airway & Oxygen Therapy: Patient Spontanous Breathing and Patient connected to face mask oxygen  Post-op Assessment: Report given to PACU RN and Post -op Vital signs reviewed and stable  Post vital signs: Reviewed and stable  Complications: No apparent anesthesia complications

## 2011-11-19 NOTE — Brief Op Note (Signed)
11/19/2011  10:59 AM  PATIENT:  Jeffrey [name]  51 y.o. male  PRE-OPERATIVE DIAGNOSIS:  left proximal ureteral stone with UTI.  POST-OPERATIVE DIAGNOSIS:  Meatal stenosis and Left proximal stone with pyonephrosis.  PROCEDURE:  Procedure(s) (LRB): CYSTOSCOPY WITH URETHRAL DILATION AND STENT PLACEMENT (Left)  SURGEON:  Surgeon(s) and Role:    * Anner Crete, MD - Primary  PHYSICIAN ASSISTANT:   ASSISTANTS: none   ANESTHESIA:   general  EBL:     BLOOD ADMINISTERED:none  DRAINS: 6x26 left JJ stent.   LOCAL MEDICATIONS USED:  LIDOCAINE   SPECIMEN:  No Specimen  DISPOSITION OF SPECIMEN:  N/A  COUNTS:  YES  TOURNIQUET:  * No tourniquets in log *  DICTATION: .Other Dictation: Dictation Number [id number]  PLAN OF CARE: Discharge to home after PACU  PATIENT DISPOSITION:  PACU - hemodynamically stable.   Delay start of Pharmacological VTE agent (>24hrs) due to surgical blood loss or risk of bleeding: no

## 2011-11-19 NOTE — Anesthesia Preprocedure Evaluation (Signed)
Anesthesia Evaluation  Patient identified by MRN, date of birth, ID band Patient awake  General Assessment Comment:No air in IV  Reviewed: Allergy & Precautions, H&P , NPO status , Patient's Chart, lab work & pertinent test results, reviewed documented beta blocker date and time   Airway Mallampati: II TM Distance: >3 FB Neck ROM: Full    Dental  (+) Dental Advisory Given   Pulmonary sleep apnea , Current Smoker,  OSA from hx only clear to auscultation        Cardiovascular hypertension, Pt. on medications Regular Normal "hole in my heart", pt doesn't know if ASD or VSD   Neuro/Psych CVA 2004 RUE, sensory deficits CVA, Residual Symptoms Negative Psych ROS   GI/Hepatic negative GI ROS, Neg liver ROS,   Endo/Other  Negative Endocrine ROS  Renal/GU Kidney stone  Genitourinary negative   Musculoskeletal negative musculoskeletal ROS (+)   Abdominal   Peds negative pediatric ROS (+)  Hematology negative hematology ROS (+)   Anesthesia Other Findings   Reproductive/Obstetrics negative OB ROS                           Anesthesia Physical Anesthesia Plan  ASA: III  Anesthesia Plan: General   Post-op Pain Management:    Induction: Intravenous  Airway Management Planned: LMA  Additional Equipment:   Intra-op Plan:   Post-operative Plan: Extubation in OR  Informed Consent:   Dental advisory given  Plan Discussed with: CRNA and Surgeon  Anesthesia Plan Comments:         Anesthesia Quick Evaluation

## 2011-11-19 NOTE — Anesthesia Postprocedure Evaluation (Signed)
  Anesthesia Post-op Note  Patient: Jeffrey [name]  Procedure(s) Performed: Procedure(s) (LRB): CYSTOSCOPY WITH RETROGRADE PYELOGRAM, URETEROSCOPY AND STENT PLACEMENT (Left)  Patient Location: PACU  Anesthesia Type: General  Level of Consciousness: oriented and sedated  Airway and Oxygen Therapy: Patient Spontanous Breathing  Post-op Pain: mild  Post-op Assessment: Post-op Vital signs reviewed, Patient's Cardiovascular Status Stable, Respiratory Function Stable and Patent Airway  Post-op Vital Signs: stable  Complications: No apparent anesthesia complications

## 2011-11-19 NOTE — Interval H&P Note (Signed)
History and Physical Interval Note:  11/19/2011 7:26 AM  Jeffrey [name]  has presented today for surgery, with the diagnosis of Left Renal Calculus  The various methods of treatment have been discussed with the patient and family. After consideration of risks, benefits and other options for treatment, the patient has consented to  Procedure(s) (LRB): EXTRACORPOREAL SHOCK WAVE LITHOTRIPSY (ESWL) (Left) as a surgical intervention .  The patients' history has been reviewed, patient examined, no change in status, stable for surgery.  I have reviewed the patients' chart and labs.  Questions were answered to the patient's satisfaction.     [name] J

## 2011-11-19 NOTE — Anesthesia Procedure Notes (Signed)
Procedure Name: LMA Insertion Date/Time: 11/19/2011 10:38 AM Performed by: Huel Coventry Pre-anesthesia Checklist: Patient identified, Emergency Drugs available, Suction available and Patient being monitored Patient Re-evaluated:Patient Re-evaluated prior to inductionOxygen Delivery Method: Circle System Utilized Preoxygenation: Pre-oxygenation with 100% oxygen Intubation Type: IV induction Ventilation: Mask ventilation without difficulty LMA: LMA inserted LMA Size: 4.0 Number of attempts: 1 Airway Equipment and Method: bite block Placement Confirmation: positive ETCO2 Tube secured with: Tape Dental Injury: Teeth and Oropharynx as per pre-operative assessment

## 2011-11-20 LAB — URINE CULTURE
Colony Count: NO GROWTH
Culture: NO GROWTH

## 2011-11-20 NOTE — Op Note (Signed)
Jeffrey [name], Jeffrey [name]                ACCOUNT NO.:  [id number]  MEDICAL RECORD NO.:  [id number]  LOCATION:  [facility]                         FACILITY:  [facility]  PHYSICIAN:  Excell Seltzer. Annabell Howells, M.D.    DATE OF BIRTH:  November 29, 1960  DATE OF PROCEDURE:  11/19/2011 DATE OF DISCHARGE:  11/19/2011                              OPERATIVE REPORT   PROCEDURE:  Cystoscopy with placement of left double-J stent.  PREOPERATIVE DIAGNOSIS:  Left proximal ureteral stone with urinary tract infection.  POSTOPERATIVE DIAGNOSIS:  Left proximal ureteral stone with pyonephrosis.  SURGEON:  Excell Seltzer. Annabell Howells, M.D.  ANESTHESIA:  General.  SPECIMEN:  None.  DRAINS:  A 6-French x 26 cm double-J stent.  COMPLICATIONS:  None.  INDICATIONS:  Jeffrey [name] is a 51 year old white male, who was diagnosed with an approximately 8 mm left proximal ureteral stone back in January. He was seen in our office on October 27, 2011, and had been on Cipro for presumed UTI.  Urine in our office had 7 to 10 white cells and a negative culture, and the patient was scheduled for lithotripsy.  On review of his chart yesterday, I felt that a urinalysis prior to surgery was indicated.  Today, that revealed a 21 to 50 white cells, with many bacteria, and the patient was reporting increased irritative symptoms. So at this point, I felt cancellation of lithotripsy was indicated with placement of a double-J stent to relieve obstruction and then treat the infection.  FINDINGS AND PROCEDURE:  He had been given p.o. Cipro this morning.  He was taken to the operating room, where general anesthetic was induced. PASs were fitted.  He was placed in lithotomy position.  His perineum and genitalia were prepped with Betadine solution.  He was draped in usual sterile fashion.  His urethral meatus was somewhat stenotic and required dilation to 26-French with [name] sounds in order to place the cystoscope.  The 22-French cystoscope was used with a 12  degree lens.  This revealed an otherwise normal urethra.  The external sphincter was intact.  The prostatic urethra was short, without obstruction.  Examination of bladder revealed mild trabeculation.  No tumor, stones, or inflammation were noted.  Ureteral orifices were unremarkable.  The left ureteral orifice was cannulated with a guidewire, which was passed to the stone and then easily bypassed the stone to the kidney. A 6-French 26 cm double-J stent without string was then passed over the wire to the kidney under fluoroscopic guidance.  Once in position, the wire was removed, leaving good coil in the kidney, a good coil in the bladder.  Immediately following placement of the stent, he was noted to have purulent material efflux from the stent holds in the bladder confirmed the presence of a pyelonephritis.  At this point, the patient's bladder was drained.  His urethra was instilled with 10 mL of 2% lidocaine jelly.  A B and O suppository had been placed prior to the procedure.  He was taken down from lithotomy position.  His anesthetic was reversed.  He was moved to the recovery room in stable condition.  He will be scheduled for lithotripsy next week and will be sent  home on Cipro, pending his urine culture.     Excell Seltzer. Annabell Howells, M.D.     JJW/MEDQ  D:  11/19/2011  T:  11/20/2011  Job:  [id number]

## 2011-11-23 NOTE — Progress Notes (Signed)
Updated pt's chart. Pt presented for ESWL on 11/19/11; however, had stent placement instead. Was rescheduled for 11/26/11. NPO for solids after midnight. Clear liquids until 9 AM that day. Reviewed need for laxative day before procedure between 3-6 PM. To bring responsible driver and blue folder. Pt verbalizes understanding.

## 2011-11-25 ENCOUNTER — Encounter (HOSPITAL_COMMUNITY): Payer: Self-pay | Admitting: Pharmacy Technician

## 2011-11-26 ENCOUNTER — Encounter (HOSPITAL_COMMUNITY)
Admission: RE | Disposition: A | Discharge status: Home / Self Care | Payer: Self-pay | Source: Ambulatory Visit | Attending: Urology

## 2011-11-26 ENCOUNTER — Ambulatory Visit (HOSPITAL_COMMUNITY)
Admission: RE | Admit: 2011-11-26 | Discharge: 2011-11-26 | Disposition: A | Discharge status: Home / Self Care | Payer: Medicare Other | Source: Ambulatory Visit | Attending: Urology | Admitting: Urology

## 2011-11-26 ENCOUNTER — Ambulatory Visit (HOSPITAL_COMMUNITY): Payer: Medicare Other | Attending: Urology

## 2011-11-26 DIAGNOSIS — N201 Calculus of ureter: Secondary | ICD-10-CM | POA: Insufficient documentation

## 2011-11-26 SURGERY — LITHOTRIPSY, ESWL
Anesthesia: LOCAL | Laterality: Left

## 2011-11-26 MED ORDER — SODIUM CHLORIDE 0.9 % IV SOLN: 250.0000 mL | INTRAVENOUS | Status: DC | PRN

## 2011-11-26 MED ORDER — DIPHENHYDRAMINE HCL 25 MG PO CAPS
25.0000 mg | ORAL_CAPSULE | ORAL | Status: AC
  Administered 2011-11-26: 25 mg via ORAL

## 2011-11-26 MED ORDER — DIAZEPAM 5 MG PO TABS
ORAL_TABLET | ORAL | Status: AC
  Administered 2011-11-26: 10 mg via ORAL
  Filled 2011-11-26: qty 2

## 2011-11-26 MED ORDER — SODIUM CHLORIDE 0.9 % IJ SOLN: 3.0000 mL | INTRAMUSCULAR | Status: DC | PRN

## 2011-11-26 MED ORDER — DEXTROSE-NACL 5-0.45 % IV SOLN
INTRAVENOUS | Status: DC
  Administered 2011-11-26: 16:00:00 via INTRAVENOUS

## 2011-11-26 MED ORDER — PROMETHAZINE HCL 25 MG/ML IJ SOLN: 12.5000 mg | Freq: Four times a day (QID) | INTRAMUSCULAR | Status: DC | PRN

## 2011-11-26 MED ORDER — OXYCODONE-ACETAMINOPHEN 10-650 MG PO TABS: 1.0000 | ORAL_TABLET | Freq: Four times a day (QID) | ORAL | Status: DC | PRN

## 2011-11-26 MED ORDER — OXYCODONE HCL 5 MG PO TABS
ORAL_TABLET | ORAL | Status: AC
  Administered 2011-11-26: 5 mg via ORAL
  Filled 2011-11-26: qty 1

## 2011-11-26 MED ORDER — MORPHINE SULFATE 10 MG/ML IJ SOLN: 2.0000 mg | INTRAMUSCULAR | Status: DC | PRN

## 2011-11-26 MED ORDER — DIAZEPAM 5 MG PO TABS
10.0000 mg | ORAL_TABLET | ORAL | Status: AC
  Administered 2011-11-26: 10 mg via ORAL

## 2011-11-26 MED ORDER — CIPROFLOXACIN HCL 500 MG PO TABS
ORAL_TABLET | ORAL | Status: AC
  Administered 2011-11-26: 500 mg via ORAL
  Filled 2011-11-26: qty 1

## 2011-11-26 MED ORDER — ONDANSETRON HCL 4 MG/2ML IJ SOLN: 4.0000 mg | Freq: Four times a day (QID) | INTRAMUSCULAR | Status: DC | PRN

## 2011-11-26 MED ORDER — ACETAMINOPHEN 325 MG PO TABS: 650.0000 mg | ORAL_TABLET | ORAL | Status: DC | PRN

## 2011-11-26 MED ORDER — DIPHENHYDRAMINE HCL 25 MG PO CAPS
ORAL_CAPSULE | ORAL | Status: AC
  Administered 2011-11-26: 25 mg via ORAL
  Filled 2011-11-26: qty 1

## 2011-11-26 MED ORDER — SODIUM CHLORIDE 0.9 % IJ SOLN: 3.0000 mL | Freq: Two times a day (BID) | INTRAMUSCULAR | Status: DC

## 2011-11-26 MED ORDER — OXYCODONE HCL 5 MG PO TABS
5.0000 mg | ORAL_TABLET | ORAL | Status: DC | PRN
  Administered 2011-11-26: 5 mg via ORAL

## 2011-11-26 MED ORDER — CIPROFLOXACIN HCL 500 MG PO TABS
500.0000 mg | ORAL_TABLET | ORAL | Status: AC
  Administered 2011-11-26: 500 mg via ORAL

## 2011-11-26 MED ORDER — ACETAMINOPHEN 650 MG RE SUPP
650.0000 mg | RECTAL | Status: DC | PRN
  Filled 2011-11-26: qty 1

## 2011-11-26 MED ORDER — ASPIRIN EC 325 MG PO TBEC: 325.0000 mg | DELAYED_RELEASE_TABLET | Freq: Every day | ORAL | Status: DC

## 2011-11-26 NOTE — Discharge Instructions (Signed)
Lithotripsy Care After Refer to this sheet for the next few weeks. These discharge instructions provide you with general information on caring for yourself after you leave the hospital. Your caregiver may also give you specific instructions. Your treatment has been planned according to the most current medical practices available, but unavoidable complications sometimes occur. If you have any problems or questions after discharge, please call your caregiver. AFTER THE PROCEDURE   The recovery time will vary with the procedure done.   You will be taken to the recovery area. A nurse will watch and check your progress. Once you are awake, stable, and taking fluids well, you will be allowed to go home as long as there are no problems.   Your urine may have a red tinge for a few days after treatment. Blood loss is usually minimal.   You may have soreness in the back or flank area. This usually goes away after a few days. The procedure can cause blotches or bruises on the back where the pressure wave enters the skin. These marks usually cause only minimal discomfort and should disappear in a short time.   Stone fragments should begin to pass within 24 hours of treatment. However, a delayed passage is not unusual.   You may have pain, discomfort, and feel sick to your stomach (nauseous) when the crushed (pulverized) fragments of stone are passed down the tube from the kidney to the bladder. Stone fragments can pass soon after the procedure and may last for up to 4 to 8 weeks.   A small number of patients may have severe pain when stone fragments are not able to pass, which leads to an obstruction.   If your stone is greater than 1 inch/2.5 centimeters in diameter or if you have multiple stones that have a combined diameter greater than 1 inch/2.5 centimeters, you may require more than 1 treatment.   You must have someone drive you home.  HOME CARE INSTRUCTIONS   Rest at home until you feel your  energy improving.   Only take over-the-counter or prescription medicines for pain, discomfort, or fever as directed by your caregiver. Depending on the type of lithotripsy, you may need to take medicines (antibiotics) that kill germs and anti-inflammatory medicines for a few days.   Drink enough water and fluids to keep your urine clear or pale yellow. This helps "flush" your kidneys. It helps pass any remaining pieces of stone and prevents stones from coming back.   Most people can resume daily activities within 1 or 2 days after standard lithotripsy. It can take longer to recover from laser and percutaneous lithotripsy.   If the stones are in your urinary system, you may be asked to strain your urine at home to look for stones. Any stones that are found can be sent to a medical lab for examination.   Visit your caregiver for a follow-up appointment in a few weeks. Your doctor may remove your stent if you have one. Your caregiver will also check to see whether stone particles still remain.  SEEK MEDICAL CARE IF:   You have an oral temperature above 102 F (38.9 C).   Your pain is not relieved by medicine.   You have a lasting nauseous feeling.   You feel there is too much blood in the urine.   You develop persistent problems with frequent and/or painful urination that does not at least partially improve after 2 days following the procedure.   You have a congested   cough.  °· You feel lightheaded.  °· You develop a rash or any other signs that might suggest an allergic problem.  °· You develop any reaction or side effects to your medicine(s).  °SEEK IMMEDIATE MEDICAL CARE IF:  °· You experience severe back and/or flank pain.  °· You see nothing but blood when you urinate.  °· You cannot pass any urine at all.  °· You have an oral temperature above 102° F (38.9° C), not controlled by medicine.  °· You develop shortness of breath, difficulty breathing, or chest pain.  °· You develop vomiting  that will not stop after 6 to 8 hours.  °· You have a fainting episode.  °MAKE SURE YOU:  °· Understand these instructions.  °· Will watch your condition.  °· Will get help right away if you are not doing well or get worse.  °Document Released: 10/11/2007 Document Revised: 05/20/2011 Document Reviewed: 10/11/2007 °Exit[employer]® Patient Information ©2012 [employer]. °

## 2011-11-26 NOTE — Op Note (Signed)
See [facility] Op note scanned in chart.

## 2011-11-26 NOTE — Interval H&P Note (Signed)
History and Physical Interval Note:  11/26/2011 7:45 AM  Jeffrey [name]  has presented today for surgery, with the diagnosis of Left Proximal Stone  The various methods of treatment have been discussed with the patient and family. After consideration of risks, benefits and other options for treatment, the patient has consented to  Procedure(s) (LRB): EXTRACORPOREAL SHOCK WAVE LITHOTRIPSY (ESWL) (Left) as a surgical intervention .  The patients' history has been reviewed, patient examined, no change in status, stable for surgery.  I have reviewed the patients' chart and labs.  Questions were answered to the patient's satisfaction.     [name]

## 2011-11-26 NOTE — Progress Notes (Signed)
Flank area softball sized pink area

## 2011-11-26 NOTE — H&P (View-Only) (Signed)
Chief Complaint  Seen today as a referral from Dr. S. Newton for evaluation of chronic LLQ pain.   Active Problems Problems  1. Abdominal Pain In The Left Lower Belly (LLQ) 789.04 2. Nephrolithiasis Of The Left Kidney 592.0 3. Urinary Tract Infection 599.0  History of Present Illness     51 YO male Jeffrey [name] of Dr. Myrna Vonseggern's seen today as a referral from Dr. S. Newton for evaluation of chronic LLQ pain and ? right renal calculus seen in [facility] 10/21/11. Has had several months of foul smelling urine and LLQ pain. Has h/o chronic pain that is controlled with Fentanyl 25 mcg patch and PRN oxycodone 10/650 mg 1 po QID PRN.   Interval HX: States that pain today is 3/10 with max pain several days ago 10/10. Feels that foul smelling urine is much improved with Cipro 500 mg 1 po BID. Denies fever/chills, or dysuria, but states that urine has been "brown" colored for several days.   Past Medical History Problems  1. History of  Abdominal Pain In The Right Lower Belly (RLQ) 789.03 2. History of  Anxiety (Symptom) 300.00 3. History of  Depression 311 4. History of  Esophageal Reflux 530.81 5. History of  Hypercholesterolemia 272.0 6. History of  Hypertension 401.9 7. History of  Lumbar Disc Degeneration 722.52 8. History of  Murmurs 785.2 9. History of  Patent Foramen Ovale 745.5 10. History of  Renal Failure 586 11. History of  Sleep Apnea 780.57 12. History of  Stroke Syndrome 436 13. History of  Venous Thrombosis V12.51  Surgical History Problems  1. History of  Interruption Inferior Vena Cava [place] Filter Placement 2. History of  Knee Surgery Right 3. History of  Total Hip Replacement  Current Meds 1. Aspirin 325 MG Oral Tablet; Therapy: (Recorded:26Oct2010) to 2. CeleXA 40 MG Oral Tablet; Therapy: (Recorded:26Oct2010) to 3. Doxazosin Mesylate 2 MG Oral Tablet; Therapy: (Recorded:26Oct2010) to 4. FentaNYL 25 MCG/HR Transdermal Patch 72 Hour; Therapy: (Recorded:26Oct2010) to 5. Fish  Oil CAPS; Therapy: (Recorded:26Oct2010) to 6. Metoprolol Succinate ER 100 MG Oral Tablet Extended Release 24 Hour; Therapy:  (Recorded:26Oct2010) to 7. Oxycodone-Acetaminophen 10-650 MG Oral Tablet; Therapy: (Recorded:26Oct2010) to 8. Protonix 40 MG Oral Packet; Therapy: (Recorded:26Oct2010) to 9. Testosterone Cypionate OIL; Therapy: (Recorded:26Oct2010) to  Allergies Medication  1. Penicillins  Family History Problems  1. Paternal history of  Family Health Status - Father's Age 74 yrs old 2. Maternal history of  Family Health Status - Mother's Age 71 yrs old 3. Family history of  Nephrolithiasis 4. Family history of  Prostate Cancer V16.42 5. Family history of  Renal Failure  Social History Problems  1. Caffeine Use 3 per day 2. Marital History - Separated 3. Physical Disability: 4. Tobacco Use V15.82 Smokes 1/2 ppd for 30 yrs Denied  5. History of  Alcohol Use  Review of Systems Genitourinary, constitutional, skin, eye, otolaryngeal, hematologic/lymphatic, cardiovascular, pulmonary, endocrine, musculoskeletal, gastrointestinal, neurological and psychiatric system(s) were reviewed and pertinent findings if present are noted.  Genitourinary: urinary frequency, nocturia and urinary stream starts and stops.  Gastrointestinal: abdominal pain and heartburn.  Constitutional: night sweats and feeling tired (fatigue).  Eyes: blurred vision.  ENT: sinus problems.  Hematologic/Lymphatic: a tendency to easily bruise.  Respiratory: shortness of breath and cough.  Endocrine: polydipsia.  Musculoskeletal: back pain and joint pain.  Psychiatric: anxiety and depression.    Vitals Vital Signs [Data Includes: Last 1 Day]  22Jan2013 09:52AM  BMI Calculated: 33.59 BSA Calculated: 2.34 Height: 6 ft    Weight: 248 lb  Blood Pressure: 111 / 69 Temperature: 97 F Heart Rate: 93  Physical Exam Constitutional: Well nourished and well developed . No acute distress. The Jeffrey [name] appears  well hydrated.  ENT:. The ears and nose are normal in appearance.  Neck: The appearance of the neck is normal.  Pulmonary: No respiratory distress.  Cardiovascular: Heart rate and rhythm are normal.  Abdomen: The abdomen is rounded. The abdomen is soft and nontender. No suprapubic tenderness. No CVA tenderness. No hernias are palpable.  Rectal: Rectal exam demonstrates normal sphincter tone, the anus is normal on inspection. and no tenderness. Estimated prostate size is 2+. Normal rectal tone, no rectal masses, prostate is smooth, symmetric and non-tender. The prostate has no nodularity, is not indurated, is not tender and is not fluctuant. The perineum is normal on inspection, no perineal tenderness.  Genitourinary: Examination of the penis demonstrates a normal meatus. The penis is circumcised. The scrotum is normal in appearance. The right testis is palpably normal, not enlarged and non-tender. The left testis is normal, not enlarged and non-tender.  Lymphatics: The femoral and inguinal nodes are not enlarged or tender.  Skin: Normal skin turgor and normal skin color and pigmentation.  Neuro/Psych:. Mood and affect are appropriate.    Results/Data Urine [Data Includes: Last 1 Day]   22Jan2013  COLOR ORANGE   APPEARANCE CLEAR   SPECIFIC GRAVITY 1.020   pH 6.0   GLUCOSE NEG mg/dL  BILIRUBIN SMALL   KETONE NEG mg/dL  BLOOD TRACE   PROTEIN NEG mg/dL  UROBILINOGEN 1 mg/dL  NITRITE NEG   LEUKOCYTE ESTERASE TRACE   SQUAMOUS EPITHELIAL/HPF RARE   WBC 7-10 WBC/hpf  RBC NONE SEEN RBC/hpf  BACTERIA NONE SEEN   CRYSTALS NONE SEEN   CASTS NONE SEEN    Old records or history reviewed: Reviewed OV notes from referring MD.  The following images/tracing/specimen were independently visualized:  CTU: left nonobstructing 8 mm renal calculus. No hydronephrosis noted. Difficult to follow ureters to bladder due to artifact from bilateral hip replacements. Does have diverticula but no stranding along  colon.  The following clinical lab reports were reviewed:  UA C&S.  The following radiology reports were reviewed: Reviewed [facility] 10/21/11: Stone is clearly seen in left renal pelvis. Selected Results  AU CT-STONE PROTOCOL 22Jan2013 12:00AM Warden, Diane   Test Name Result Flag Reference  ** RADIOLOGY REPORT BY [facility] ** ORIGINAL APPROVED BY: ARTHUR A. HOSS, M.D. ON: 10/27/2011 15:08:33   *RADIOLOGY REPORT*  Clinical Data: Renal calculus  CT OF THE ABDOMEN AND PELVIS WITHOUT CONTRAST (CT UROGRAM)  Technique: Multidetector CT imaging was performed through the abdomen and pelvis to include the urinary tract.  Comparison: None  Findings: 8 mm calculus in the left renal pelvis. Just inferior and lateral to the calculus, there is a 1.7 x 2.1 cm fluid density area representing a pelvic cysts or a dilated calix.  No evidence of right nephrolithiasis. No ureteral calculus.  There is severe atrophy of the left iliopsoas muscle.  Tiny bilateral pleural effusions.  The visualized portions of the gallbladder, spleen, pancreas, adrenal glands are within normal limits. Sub centimeter nonspecific hypodensity in the right lobe of the liver on image 10.  An infrarenal IVC filter is in place. Several legs protrude well beyond the lumen of the IVC and two of them are adjacent to the L2 vertebral body.  The bladder is obscured by streak artifact from the total hip arthroplasties.  Degenerative changes and spinal   stenosis in the lumbar spine.  No obvious abnormal adenopathy or free fluid.  IMPRESSION: Left nephrolithiasis.  Dilated calix versus pelvic cysts in the left kidney. Contrast enhanced study would be helpful.  Abnormal orientation of the legs of the infrarenal IVC filter as described above. Correlate with symptomatology.  Small bilateral pleural effusions.   Assessment Assessed  1. Abdominal Pain In The Left Lower Belly (LLQ) 789.04 2. Nephrolithiasis Of  The Left Kidney 592.0 3. Urinary Tract Infection 599.0  Plan  Nephrolithiasis Of Both Kidneys (592.0)  1. Ketorolac Tromethamine 60 MG/2ML Injection Solution; INJECT 60  MG Intramuscular; Done:  22Jan2013 10:32AM; Status: COMPLETE Urinary Tract Infection (599.0)  2. URINE CULTURE  Done: 22Jan2013         1. Culture urine. Will complete Cipro as RX'd 2. Ketorolac 60 mg IM  3. Discuss ESWL vs cystoureterscopy, L RPG, stone extraction. Wishes to proceed with (L) ESWL. Risks and benefits discussed. Risks: infection, hematoma, or stone may fracture into several small fragments to pass. He understands that he may also need 2nd procedure if stones are not passable and he develops acute pain or fever/chills. Voices understanding. Wishes to proceed. All questions addressed and answered to pt's satisification.  AU CT-STONE PROTOCOL  Status: Resulted - Requires Verification  Done: 01Jan0001 12:00AM Ordered Today; For: Nephrolithiasis Of Both Kidneys (592.0); Ordered By: Warden, Diane  Due: 24Jan2013 Marked Important; Last Updated By: Thore, Debbie   

## 2011-11-26 NOTE — Interval H&P Note (Signed)
History and Physical Interval Note:  11/26/2011 5:44 PM  Jeffrey [name]  has presented today for surgery, with the diagnosis of Left Proximal Stone  The various methods of treatment have been discussed with the patient and family. After consideration of risks, benefits and other options for treatment, the patient has consented to  Procedure(s) (LRB): EXTRACORPOREAL SHOCK WAVE LITHOTRIPSY (ESWL) (Left) as a surgical intervention .  The patients' history has been reviewed, patient examined, no change in status, stable for surgery.  I have reviewed the patients' chart and labs.  Questions were answered to the patient's satisfaction.     [name]

## 2011-11-27 ENCOUNTER — Encounter (HOSPITAL_COMMUNITY): Payer: Self-pay

## 2011-12-16 ENCOUNTER — Ambulatory Visit (INDEPENDENT_AMBULATORY_CARE_PROVIDER_SITE_OTHER): Payer: Medicare Other | Admitting: Family Medicine

## 2011-12-16 ENCOUNTER — Encounter: Payer: Self-pay | Admitting: Family Medicine

## 2011-12-16 VITALS — BP 129/79 | HR 92 | Temp 97.9°F | Ht 72.0 in | Wt 220.0 lb

## 2011-12-16 DIAGNOSIS — N182 Chronic kidney disease, stage 2 (mild): Secondary | ICD-10-CM

## 2011-12-16 DIAGNOSIS — G8929 Other chronic pain: Secondary | ICD-10-CM

## 2011-12-16 DIAGNOSIS — E781 Pure hyperglyceridemia: Secondary | ICD-10-CM

## 2011-12-16 DIAGNOSIS — I10 Essential (primary) hypertension: Secondary | ICD-10-CM

## 2011-12-16 DIAGNOSIS — E291 Testicular hypofunction: Secondary | ICD-10-CM

## 2011-12-16 DIAGNOSIS — N200 Calculus of kidney: Secondary | ICD-10-CM

## 2011-12-16 LAB — LDL CHOLESTEROL, DIRECT: Direct LDL: 93 mg/dL

## 2011-12-16 LAB — COMPREHENSIVE METABOLIC PANEL
ALT: 14 U/L (ref 0–53)
AST: 18 U/L (ref 0–37)
Albumin: 4.3 g/dL (ref 3.5–5.2)
Calcium: 9.7 mg/dL (ref 8.4–10.5)
Glucose, Bld: 105 mg/dL — ABNORMAL HIGH (ref 70–99)
Sodium: 138 mEq/L (ref 135–145)

## 2011-12-16 MED ORDER — CITALOPRAM HYDROBROMIDE 20 MG PO TABS: 20.0000 mg | ORAL_TABLET | Freq: Two times a day (BID) | ORAL | Status: DC

## 2011-12-16 MED ORDER — FENTANYL 25 MCG/HR TD PT72: 1.0000 | MEDICATED_PATCH | TRANSDERMAL | Status: DC | PRN

## 2011-12-16 MED ORDER — TESTOSTERONE CYPIONATE 200 MG/ML IM SOLN
300.0000 mg | Freq: Once | INTRAMUSCULAR | Status: AC
  Administered 2011-12-16: 300 mg via INTRAMUSCULAR

## 2011-12-16 MED ORDER — BUPROPION HCL ER (SR) 150 MG PO TB12: 150.0000 mg | ORAL_TABLET | Freq: Two times a day (BID) | ORAL | Status: DC

## 2011-12-16 MED ORDER — OXYCODONE-ACETAMINOPHEN 10-650 MG PO TABS: 1.0000 | ORAL_TABLET | Freq: Four times a day (QID) | ORAL | Status: DC | PRN

## 2011-12-16 NOTE — Progress Notes (Signed)
S:  Pt is here today for general follow up visit.   Infected Kidney Stone: Pt is noted to have recently had an infected kidney stone that was treated by [facility]. Per pt, a lithotripsy was initially scheduled on  2/21, however, the procedure converted to ureteral stent placement. From a symptomatic standpoint, pt states that LLQ pain has resolved. Pt states that he has had some small kidney stone fragments intermittently with his urine. No fever, nausea, vomiting. Pt states that urostomy tube is due for removal 12/17/11.  From a pain standpoint, pain has been overall well controlled on current regimen of percocet and fentanyl. Per pt, there was a question of whether the urologist wanted him to stop taking the fentanyl patch. Pt states that he has not taken one over the last 2 days.   HTN:  Checking BPS Daily ?:no; intermittently  BP Range:140s-160s Any HA, CP, SOB?:no Any Medication Side Effects:no Medication Compliance:yes Salt intake?:stable Exercise?:3-4 times per week prior to kidney stone  Weight Loss?:stable            O:  Current Outpatient Prescriptions  Medication Sig Dispense Refill  . amLODipine (NORVASC) 10 MG tablet Take 10 mg by mouth daily at 12 noon.       [name] aspirin EC 325 MG tablet Take 1 tablet (325 mg total) by mouth daily at 12 noon.  30 tablet  0  . buPROPion (WELLBUTRIN SR) 150 MG 12 hr tablet TAKE 1 TABLET BY MOUTH TWICE DAILY  60 tablet  0  . carvedilol (COREG) 25 MG tablet Take 1 tablet (25 mg total) by mouth 2 (two) times daily.  60 tablet  11  . citalopram (CELEXA) 20 MG tablet Take 20 mg by mouth 2 (two) times daily.      [name] DOXAZOSIN MESYLATE PO Take 2 mg by mouth daily before breakfast.       . doxycycline (VIBRAMYCIN) 100 MG capsule Take 1 capsule (100 mg total) by mouth daily.  30 capsule  6  . fentaNYL (DURAGESIC - DOSED MCG/HR) 25 MCG/HR Place 1 patch onto the skin every 3 (three) days as needed. Pain      . gabapentin (NEURONTIN) 300 MG  capsule Take 600-900 mg by mouth 2 (two) times daily as needed. Use 2 tabs in the morning and 3 tabs nightly. Pain       . Meth-Hyo-M Bl-Na Phos-Ph Sal (URIBEL) 118 MG CAPS Take 1 capsule (118 mg total) by mouth 4 (four) times daily as needed (bladder spasm).  24 capsule  11  . naphazoline (CLEAR EYES) 0.012 % ophthalmic solution Place 2 drops into both eyes daily.      . OMEGA 3 1000 MG CAPS Take 2 capsules by mouth 2 (two) times daily.       [name] oxyCODONE-acetaminophen (PERCOCET) 10-650 MG per tablet Take 1 tablet by mouth every 6 (six) hours as needed for pain. Pain  20 tablet  0  . pantoprazole (PROTONIX) 40 MG tablet TAKE 1 TABLET BY MOUTH EVERY DAY  32 tablet  5  . testosterone cypionate (DEPOTESTOTERONE CYPIONATE) 200 MG/ML injection Inject 1.5 mLs (300 mg total) into the muscle every 14 (fourteen) days.  10 mL  5   Current Facility-Administered Medications  Medication Dose Route Frequency Provider Last Rate Last Dose  . testosterone cypionate (DEPOTESTOTERONE CYPIONATE) injection 300 mg  300 mg Intramuscular Q14 Days Floydene Flock, MD   300 mg at 04/16/11 1340    Wt Readings from Last  3 Encounters:  12/16/11 220 lb (99.791 kg)  11/26/11 223 lb 6 oz (101.322 kg)  11/26/11 223 lb 6 oz (101.322 kg)   Temp Readings from Last 3 Encounters:  12/16/11 97.9 F (36.6 C) Oral  11/26/11 97.6 F (36.4 C)   11/26/11 97.6 F (36.4 C)    BP Readings from Last 3 Encounters:  12/16/11 129/79  11/26/11 120/70  11/26/11 120/70   Pulse Readings from Last 3 Encounters:  12/16/11 92  11/26/11 84  11/26/11 84    General: alert and cooperative HEENT: PERRLA and extra ocular movement intact Heart: S1, S2 normal, no murmur, rub or gallop, regular rate and rhythm Lungs: clear to auscultation, no wheezes or rales and unlabored breathing Abdomen: abdomen is soft without significant tenderness, masses, organomegaly or guarding; No LLQ pain or tenderness Extremities: extremities normal,  atraumatic, no cyanosis or edema Skin:no rashes Neurology: mental status, speech normal, alert and oriented x3   A/P:

## 2011-12-16 NOTE — Patient Instructions (Signed)
It was good to see you today I am glad that you are feeling better I am checking some blood work today  Come back to see me in 2-3 months I will refill your fentanyl patch, but hold on using it until I talk with urology Call if any questions,  God Bless, Doree Albee MD

## 2011-12-25 NOTE — Assessment & Plan Note (Signed)
Fentanyl 25.mcg (#10)  and percocet 10/650 (#100) refilled today. Extra script given on both of these meds for 2 months worth of medication. Has been stable on this regimen for > 1.5 years with no early script requests.  Will follow up with urology about question of fentanyl patch.

## 2011-12-25 NOTE — Assessment & Plan Note (Signed)
Checking LDL today.  

## 2011-12-25 NOTE — Assessment & Plan Note (Signed)
Still making urine. Checking Cr and K today. If GFR drops below 60, may consider renal referral.

## 2011-12-25 NOTE — Assessment & Plan Note (Signed)
Clinically improving with stone fragment passing daily. Afebrile. Planned follow up with urology within the week. Infectious red flags discussed at length which pt was aware of. Will follow PRN.

## 2011-12-25 NOTE — Assessment & Plan Note (Signed)
Overall stable. Will check CR and K. Continue with current regimen.

## 2012-01-13 ENCOUNTER — Ambulatory Visit (INDEPENDENT_AMBULATORY_CARE_PROVIDER_SITE_OTHER): Payer: Medicare Other | Admitting: *Deleted

## 2012-01-13 ENCOUNTER — Telehealth: Payer: Self-pay | Admitting: *Deleted

## 2012-01-13 DIAGNOSIS — E291 Testicular hypofunction: Secondary | ICD-10-CM

## 2012-01-13 DIAGNOSIS — E349 Endocrine disorder, unspecified: Secondary | ICD-10-CM

## 2012-01-13 MED ORDER — TESTOSTERONE CYPIONATE 200 MG/ML IM SOLN
300.0000 mg | Freq: Once | INTRAMUSCULAR | Status: AC
  Administered 2012-01-13: 300 mg via INTRAMUSCULAR

## 2012-01-13 NOTE — Telephone Encounter (Signed)
Patient requests refill on testosterone cypionate  for injection.  Will route message to Dr. Denyse Amass in Dr. [place] Blas absence.

## 2012-01-13 NOTE — Progress Notes (Signed)
In office for testosterone injection.

## 2012-01-14 ENCOUNTER — Telehealth: Payer: Self-pay | Admitting: Family Medicine

## 2012-01-14 DIAGNOSIS — E291 Testicular hypofunction: Secondary | ICD-10-CM

## 2012-01-14 MED ORDER — TESTOSTERONE CYPIONATE 200 MG/ML IM SOLN: 300.0000 mg | INTRAMUSCULAR | Status: DC

## 2012-01-14 NOTE — Telephone Encounter (Signed)
Refill testosterone

## 2012-01-14 NOTE — Telephone Encounter (Signed)
Testosterone cannot be refilled over the phone as it is a controlled substance. He will require a physician visit with Dr. Alvester Morin for the refill this medication

## 2012-01-14 NOTE — Telephone Encounter (Signed)
RX for testosterone called to pharmacy.

## 2012-01-27 ENCOUNTER — Ambulatory Visit (INDEPENDENT_AMBULATORY_CARE_PROVIDER_SITE_OTHER): Payer: Medicare Other | Admitting: *Deleted

## 2012-01-27 DIAGNOSIS — E349 Endocrine disorder, unspecified: Secondary | ICD-10-CM

## 2012-01-27 DIAGNOSIS — E291 Testicular hypofunction: Secondary | ICD-10-CM

## 2012-01-27 MED ORDER — TESTOSTERONE CYPIONATE 200 MG/ML IM SOLN
300.0000 mg | Freq: Once | INTRAMUSCULAR | Status: AC
  Administered 2012-01-27: 300 mg via INTRAMUSCULAR

## 2012-02-10 ENCOUNTER — Other Ambulatory Visit: Payer: Self-pay | Admitting: Family Medicine

## 2012-02-11 ENCOUNTER — Ambulatory Visit (INDEPENDENT_AMBULATORY_CARE_PROVIDER_SITE_OTHER): Payer: Medicare Other | Admitting: Family Medicine

## 2012-02-11 ENCOUNTER — Encounter: Payer: Self-pay | Admitting: Family Medicine

## 2012-02-11 VITALS — BP 157/101 | HR 83 | Ht 72.0 in | Wt 229.0 lb

## 2012-02-11 DIAGNOSIS — G8929 Other chronic pain: Secondary | ICD-10-CM

## 2012-02-11 DIAGNOSIS — I10 Essential (primary) hypertension: Secondary | ICD-10-CM

## 2012-02-11 DIAGNOSIS — E291 Testicular hypofunction: Secondary | ICD-10-CM

## 2012-02-11 MED ORDER — FENTANYL 25 MCG/HR TD PT72: 1.0000 | MEDICATED_PATCH | TRANSDERMAL | Status: DC | PRN

## 2012-02-11 MED ORDER — DOXAZOSIN MESYLATE 4 MG PO TABS: 4.0000 mg | ORAL_TABLET | Freq: Every day | ORAL | Status: DC

## 2012-02-11 MED ORDER — OXYCODONE-ACETAMINOPHEN 10-650 MG PO TABS: 1.0000 | ORAL_TABLET | Freq: Four times a day (QID) | ORAL | Status: DC | PRN

## 2012-02-11 NOTE — Patient Instructions (Signed)
It was good to see today I have increased your Cardura to help your blood pressures Come back early next week for blood pressure recheck I'm checking some blood work today Call if any questions

## 2012-02-11 NOTE — Progress Notes (Signed)
Subjective:   Chronic Pain: Baseline hx/o chronic low back pain. S/p AVN of the femoral head. L THR x 2. R THR x1.  Pt on fentanyl patch q3 days and percocet 10-650 # 100 monthly. Has been on this regimen for 4-5 years. Has not had early refill request during this time period.  Worsening pain over last 2-3 months. Pain keeping pt up at night. R Hip pain has progressively worsened. Unsure if R hip needs to be replaced (? Recalled hip). Has appt with [facility] on 5/29 (Dr. Andrey Campanile).   Kidney Stone: Resolved per pt.   Hypertension:  Checking BPS Daily ?:no BP Range:130s-150s Any HA, CP, SOB?:no Any Medication Side Effects:no Medication Compliance:yes Salt intake?:low Exercise?:walking    Review of Systems - Negative except as noted abov in HPI   Objective:  Current Outpatient Prescriptions  Medication Sig Dispense Refill  . amLODipine (NORVASC) 10 MG tablet Take 10 mg by mouth daily at 12 noon.       [name] aspirin EC 325 MG tablet Take 1 tablet (325 mg total) by mouth daily at 12 noon.  30 tablet  0  . buPROPion (WELLBUTRIN SR) 150 MG 12 hr tablet Take 1 tablet (150 mg total) by mouth 2 (two) times daily.  60 tablet  11  . carvedilol (COREG) 25 MG tablet Take 1 tablet (25 mg total) by mouth 2 (two) times daily.  60 tablet  11  . ciprofloxacin (CIPRO) 250 MG tablet       . citalopram (CELEXA) 20 MG tablet Take 1 tablet (20 mg total) by mouth 2 (two) times daily.  60 tablet  11  . doxazosin (CARDURA) 2 MG tablet       . doxazosin (CARDURA) 4 MG tablet Take 1 tablet (4 mg total) by mouth at bedtime.  30 tablet  3  . DOXAZOSIN MESYLATE PO Take 2 mg by mouth daily before breakfast.       . doxycycline (VIBRAMYCIN) 100 MG capsule Take 1 capsule (100 mg total) by mouth daily.  30 capsule  6  . fentaNYL (DURAGESIC - DOSED MCG/HR) 25 MCG/HR Place 1 patch (25 mcg total) onto the skin every 3 (three) days as needed. Pain  10 patch  0  . gabapentin (NEURONTIN) 300 MG capsule Take 600-900 mg by  mouth 2 (two) times daily as needed. Use 2 tabs in the morning and 3 tabs nightly. Pain       . Meth-Hyo-M Bl-Na Phos-Ph Sal (URIBEL) 118 MG CAPS Take 1 capsule (118 mg total) by mouth 4 (four) times daily as needed (bladder spasm).  24 capsule  11  . naphazoline (CLEAR EYES) 0.012 % ophthalmic solution Place 2 drops into both eyes daily.      . OMEGA 3 1000 MG CAPS Take 2 capsules by mouth 2 (two) times daily.       [name] oxyCODONE-acetaminophen (PERCOCET) 10-650 MG per tablet Take 1 tablet by mouth every 6 (six) hours as needed for pain. Pain  10 tablet  0  . pantoprazole (PROTONIX) 40 MG tablet TAKE 1 TABLET BY MOUTH EVERY DAY  32 tablet  5  . testosterone cypionate (DEPOTESTOTERONE CYPIONATE) 200 MG/ML injection Inject 1.5 mLs (300 mg total) into the muscle every 14 (fourteen) days.  10 mL  1  . DISCONTD: doxazosin (CARDURA) 2 MG tablet TAKE 1 TABLET EVERY DAY  30 tablet  3  . DISCONTD: fentaNYL (DURAGESIC - DOSED MCG/HR) 25 MCG/HR Place 1 patch (25 mcg total)  onto the skin every 3 (three) days as needed. Pain  10 patch  0  . DISCONTD: oxyCODONE-acetaminophen (PERCOCET) 10-650 MG per tablet Take 1 tablet by mouth every 6 (six) hours as needed for pain. Pain  100 tablet  0  . DISCONTD: oxyCODONE-acetaminophen (PERCOCET) 10-650 MG per tablet Take 1 tablet by mouth every 6 (six) hours as needed for pain. Pain  100 tablet  0   Current Facility-Administered Medications  Medication Dose Route Frequency Provider Last Rate Last Dose  . testosterone cypionate (DEPOTESTOTERONE CYPIONATE) injection 300 mg  300 mg Intramuscular Q14 Days Floydene Flock, MD   300 mg at 04/16/11 1340    Wt Readings from Last 3 Encounters:  02/11/12 229 lb (103.874 kg)  12/16/11 220 lb (99.791 kg)  11/26/11 223 lb 6 oz (101.322 kg)   Temp Readings from Last 3 Encounters:  12/16/11 97.9 F (36.6 C) Oral  11/26/11 97.6 F (36.4 C)   11/26/11 97.6 F (36.4 C)    BP Readings from Last 3 Encounters:  02/11/12 158/106    12/16/11 129/79  11/26/11 120/70   Pulse Readings from Last 3 Encounters:  02/11/12 83  12/16/11 92  11/26/11 84    General: alert and cooperative  HEENT: PERRLA and extra ocular movement intact  Heart: S1, S2 normal, no murmur, rub or gallop, regular rate and rhythm  Lungs: clear to auscultation, no wheezes or rales and unlabored breathing  Abdomen: abdomen is soft without significant tenderness, masses, organomegaly or guarding; No LLQ pain or tenderness  Extremities: extremities normal, atraumatic, no cyanosis or edema; + pain with hip internal and external rotation.  Skin:no rashes  Neurology: mental status, speech normal, alert and oriented x3    Assessment/Plan:

## 2012-02-12 LAB — TESTOSTERONE, FREE, TOTAL, SHBG
Sex Hormone Binding: 17 nmol/L (ref 13–71)
Testosterone, Free: 55.2 pg/mL (ref 47.0–244.0)

## 2012-02-12 LAB — COMPREHENSIVE METABOLIC PANEL
AST: 32 U/L (ref 0–37)
Albumin: 4.5 g/dL (ref 3.5–5.2)
BUN: 17 mg/dL (ref 6–23)
Calcium: 10.1 mg/dL (ref 8.4–10.5)
Chloride: 102 mEq/L (ref 96–112)
Creat: 1.26 mg/dL (ref 0.50–1.35)
Glucose, Bld: 89 mg/dL (ref 70–99)
Potassium: 3.8 mEq/L (ref 3.5–5.3)

## 2012-02-12 MED ORDER — TESTOSTERONE CYPIONATE 200 MG/ML IM SOLN
300.0000 mg | Freq: Once | INTRAMUSCULAR | Status: AC
  Administered 2012-02-11: 300 mg via INTRAMUSCULAR

## 2012-02-15 ENCOUNTER — Other Ambulatory Visit: Payer: Self-pay | Admitting: Family Medicine

## 2012-02-15 ENCOUNTER — Encounter: Payer: Self-pay | Admitting: Family Medicine

## 2012-02-15 DIAGNOSIS — E291 Testicular hypofunction: Secondary | ICD-10-CM

## 2012-02-17 NOTE — Assessment & Plan Note (Signed)
Will increase cardura to help with BP goals. Pain also likely contributing to pressures.

## 2012-02-17 NOTE — Assessment & Plan Note (Signed)
Flare above baseline today 2/2 to R hip. An additional percocet 10/650 # 10 given in addition to monthly refill of fentanyl #10 monthly and percocet 10/650 #100 monthly. Broached issue that if pain demands increase, pain management referral may be warranted.

## 2012-02-17 NOTE — Assessment & Plan Note (Signed)
Rechecking testosterone level today.

## 2012-02-23 ENCOUNTER — Ambulatory Visit (INDEPENDENT_AMBULATORY_CARE_PROVIDER_SITE_OTHER): Payer: Medicare Other | Admitting: *Deleted

## 2012-02-23 ENCOUNTER — Other Ambulatory Visit: Payer: Self-pay | Admitting: Family Medicine

## 2012-02-23 VITALS — BP 140/102 | HR 80 | Wt 231.3 lb

## 2012-02-23 DIAGNOSIS — I10 Essential (primary) hypertension: Secondary | ICD-10-CM

## 2012-02-23 DIAGNOSIS — E291 Testicular hypofunction: Secondary | ICD-10-CM

## 2012-02-23 MED ORDER — TESTOSTERONE CYPIONATE 200 MG/ML IM SOLN: 400.0000 mg | INTRAMUSCULAR | Status: DC

## 2012-02-23 MED ORDER — HYDRALAZINE HCL 25 MG PO TABS: ORAL_TABLET | ORAL | Status: DC

## 2012-02-23 NOTE — Progress Notes (Signed)
Patient in for BP check today  BP checked manually using regular adult cuff. BP LA 140 100 and RA 140/102. Pulse 80. Patent states he is taking doxazosin 2 mg in AM and 4 mg at bedtime.  Patient states he has gained weight over past few months. Weight today 231.3 lb    Also patient received letter from Dr. Alvester Morin advising to increase testosterone from 200 mg to 300 mg. However patient has been on 300 mg since  01/12/2011. Will forward to Dr. Alvester Morin to please advise.

## 2012-02-23 NOTE — Progress Notes (Addendum)
Call voicemail for patient about testosterone. Told patient that  We would be making a dosing change. So patient will be changing from 300 mg of testosterone to 400 mg of testosterone. Also discussed with patient that I will be starting him on a new blood pressure medication (hydralazine). Instructed patient to come back in for follow visit in one week. Told to call if there were any particular questions.

## 2012-02-24 ENCOUNTER — Telehealth: Payer: Self-pay | Admitting: *Deleted

## 2012-02-24 NOTE — Telephone Encounter (Signed)
PA required for testosterone cypionate. Form placed in MD box.

## 2012-02-26 ENCOUNTER — Telehealth: Payer: Self-pay | Admitting: Family Medicine

## 2012-02-26 NOTE — Telephone Encounter (Signed)
Patient is calling about the new Rx for BP.  That med was the kind that he was taken off of before since he had a reaction to it.  This new med is HCTZ.  He would like to know what will be prescribed in its place.

## 2012-03-02 DIAGNOSIS — M25559 Pain in unspecified hip: Secondary | ICD-10-CM | POA: Insufficient documentation

## 2012-03-04 NOTE — Telephone Encounter (Signed)
Spoke with Joelene Millin at medicare Part D prior authorization call center. Prior authorization has already been completed. Pharmacy will need to run medication through again.

## 2012-03-04 NOTE — Telephone Encounter (Signed)
Pharmacy notified.

## 2012-03-04 NOTE — Telephone Encounter (Signed)
Called and left VM for pt about medication (hydralazine) that it is differerent from HCTZ, so this should not cause a reaction. Told pt to call if he had any other questions.

## 2012-03-16 ENCOUNTER — Ambulatory Visit (INDEPENDENT_AMBULATORY_CARE_PROVIDER_SITE_OTHER): Payer: Medicare Other | Admitting: Family Medicine

## 2012-03-16 ENCOUNTER — Encounter: Payer: Self-pay | Admitting: Family Medicine

## 2012-03-16 VITALS — BP 132/77 | HR 82 | Ht 72.0 in | Wt 225.0 lb

## 2012-03-16 DIAGNOSIS — E291 Testicular hypofunction: Secondary | ICD-10-CM

## 2012-03-16 DIAGNOSIS — M25569 Pain in unspecified knee: Secondary | ICD-10-CM

## 2012-03-16 DIAGNOSIS — G8929 Other chronic pain: Secondary | ICD-10-CM

## 2012-03-16 DIAGNOSIS — I10 Essential (primary) hypertension: Secondary | ICD-10-CM

## 2012-03-16 DIAGNOSIS — M25562 Pain in left knee: Secondary | ICD-10-CM | POA: Insufficient documentation

## 2012-03-16 MED ORDER — OXYCODONE-ACETAMINOPHEN 10-650 MG PO TABS: 1.0000 | ORAL_TABLET | Freq: Four times a day (QID) | ORAL | Status: DC | PRN

## 2012-03-16 MED ORDER — FENTANYL 25 MCG/HR TD PT72: 1.0000 | MEDICATED_PATCH | TRANSDERMAL | Status: DC | PRN

## 2012-03-16 MED ORDER — TESTOSTERONE CYPIONATE 200 MG/ML IM SOLN
400.0000 mg | Freq: Once | INTRAMUSCULAR | Status: AC
  Administered 2012-03-16: 400 mg via INTRAMUSCULAR

## 2012-03-16 NOTE — Patient Instructions (Signed)
We're getting knee x-rays as well as an ultrasound of the knee If her pain worsens or if he notes any worsening swelling please call us Call if any questions Knee Pain The knee is the complex joint between your thigh and your lower leg. It is made up of bones, tendons, ligaments, and cartilage. The bones that make up the knee are:  The femur in the thigh.   The tibia and fibula in the lower leg.   The patella or kneecap riding in the groove on the lower femur.  CAUSES  Knee pain is a common complaint with many causes. A few of these causes are:  Injury, such as:   A ruptured ligament or tendon injury.   Torn cartilage.   Medical conditions, such as:   Gout   Arthritis   Infections   Overuse, over training or overdoing a physical activity.  Knee pain can be minor or severe. Knee pain can accompany debilitating injury. Minor knee problems often respond well to self-care measures or get well on their own. More serious injuries may need medical intervention or even surgery. SYMPTOMS The knee is complex. Symptoms of knee problems can vary widely. Some of the problems are:  Pain with movement and weight bearing.   Swelling and tenderness.   Buckling of the knee.   Inability to straighten or extend your knee.   Your knee locks and you cannot straighten it.   Warmth and redness with pain and fever.   Deformity or dislocation of the kneecap.  DIAGNOSIS  Determining what is wrong may be very straight forward such as when there is an injury. It can also be challenging because of the complexity of the knee. Tests to make a diagnosis may include:  Your caregiver taking a history and doing a physical exam.   Routine X-rays can be used to rule out other problems. X-rays will not reveal a cartilage tear. Some injuries of the knee can be diagnosed by:   Arthroscopy a surgical technique by which a small video camera is inserted through tiny incisions on the sides of the knee. This  procedure is used to examine and repair internal knee joint problems. Tiny instruments can be used during arthroscopy to repair the torn knee cartilage (meniscus).   Arthrography is a radiology technique. A contrast liquid is directly injected into the knee joint. Internal structures of the knee joint then become visible on X-ray film.   An MRI scan is a non x-ray radiology procedure in which magnetic fields and a computer produce two- or three-dimensional images of the inside of the knee. Cartilage tears are often visible using an MRI scanner. MRI scans have largely replaced arthrography in diagnosing cartilage tears of the knee.   Blood work.   Examination of the fluid that helps to lubricate the knee joint (synovial fluid). This is done by taking a sample out using a needle and a syringe.  TREATMENT The treatment of knee problems depends on the cause. Some of these treatments are:  Depending on the injury, proper casting, splinting, surgery or physical therapy care will be needed.   Give yourself adequate recovery time. Do not overuse your joints. If you begin to get sore during workout routines, back off. Slow down or do fewer repetitions.   For repetitive activities such as cycling or running, maintain your strength and nutrition.   Alternate muscle groups. For example if you are a weight lifter, work the upper body on one day and the  lower body the next.   Either tight or weak muscles do not give the proper support for your knee. Tight or weak muscles do not absorb the stress placed on the knee joint. Keep the muscles surrounding the knee strong.   Take care of mechanical problems.   If you have flat feet, orthotics or special shoes may help. See your caregiver if you need help.   Arch supports, sometimes with wedges on the inner or outer aspect of the heel, can help. These can shift pressure away from the side of the knee most bothered by osteoarthritis.   A brace called an  "unloader" brace also may be used to help ease the pressure on the most arthritic side of the knee.   If your caregiver has prescribed crutches, braces, wraps or ice, use as directed. The acronym for this is PRICE. This means protection, rest, ice, compression and elevation.   Nonsteroidal anti-inflammatory drugs (NSAID's), can help relieve pain. But if taken immediately after an injury, they may actually increase swelling. Take NSAID's with food in your stomach. Stop them if you develop stomach problems. Do not take these if you have a history of ulcers, stomach pain or bleeding from the bowel. Do not take without your caregiver's approval if you have problems with fluid retention, heart failure, or kidney problems.   For ongoing knee problems, physical therapy may be helpful.   Glucosamine and chondroitin are over-the-counter dietary supplements. Both may help relieve the pain of osteoarthritis in the knee. These medicines are different from the usual anti-inflammatory drugs. Glucosamine may decrease the rate of cartilage destruction.   Injections of a corticosteroid drug into your knee joint may help reduce the symptoms of an arthritis flare-up. They may provide pain relief that lasts a few months. You may have to wait a few months between injections. The injections do have a small increased risk of infection, water retention and elevated blood sugar levels.   Hyaluronic acid injected into damaged joints may ease pain and provide lubrication. These injections may work by reducing inflammation. A series of shots may give relief for as long as 6 months.   Topical painkillers. Applying certain ointments to your skin may help relieve the pain and stiffness of osteoarthritis. Ask your pharmacist for suggestions. Many over the-counter products are approved for temporary relief of arthritis pain.   In some countries, doctors often prescribe topical NSAID's for relief of chronic conditions such as  arthritis and tendinitis. A review of treatment with NSAID creams found that they worked as well as oral medications but without the serious side effects.  PREVENTION  Maintain a healthy weight. Extra pounds put more strain on your joints.   Get strong, stay limber. Weak muscles are a common cause of knee injuries. Stretching is important. Include flexibility exercises in your workouts.   Be smart about exercise. If you have osteoarthritis, chronic knee pain or recurring injuries, you may need to change the way you exercise. This does not mean you have to stop being active. If your knees ache after jogging or playing basketball, consider switching to swimming, water aerobics or other low-impact activities, at least for a few days a week. Sometimes limiting high-impact activities will provide relief.   Make sure your shoes fit well. Choose footwear that is right for your sport.   Protect your knees. Use the proper gear for knee-sensitive activities. Use kneepads when playing volleyball or laying carpet. Buckle your seat belt every time you drive. Most  shattered kneecaps occur in car accidents.   Rest when you are tired.  SEEK MEDICAL CARE IF:  You have knee pain that is continual and does not seem to be getting better.  SEEK IMMEDIATE MEDICAL CARE IF:  Your knee joint feels hot to the touch and you have a high fever. MAKE SURE YOU:   Understand these instructions.   Will watch your condition.   Will get help right away if you are not doing well or get worse.  Document Released: 07/19/2007 Document Revised: 09/10/2011 Document Reviewed: 07/19/2007 [facility] Patient Information 2012 [place], [place].

## 2012-03-16 NOTE — Assessment & Plan Note (Signed)
BPs improved with hydralazine. Continue with current regimen.

## 2012-03-16 NOTE — Assessment & Plan Note (Addendum)
Symptoms most consistent with meniscal injury. Also in differential includes degenerative arthritis versus Baker's cyst versus DVT. Wells score of 1-2.  Calves are symmetric which is reassuring. Will obtain x-rays to assess for meniscal/degenerative pathology. Lower extremity ultrasound to evaluate for ruptured Baker's cyst versus DVT. Therapeutic knee injection at bedside  After consent was obtained, using sterile technique the knee was injected via the medial infrapatellar approach. The knee joint was entered and 40mg  methylprednisolone and 4 ccs 1% lidocaine was then injected into the knee.  The procedure was well tolerated.  The patient is asked to continue to rest the knee for a few more days before resuming regular activities.  It may be more painful for the first 1-2 days.  Watch for fever, or increased swelling or persistent pain in knee. Call or return to clinic prn if such symptoms occur or the knee fails to improve as anticipated.

## 2012-03-16 NOTE — Progress Notes (Signed)
  Subjective:    Patient ID: Jeffrey [name], male    DOB: [birth year], 51 y.o.   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 Patient states that once he got up he had significant left knee pain. Pain predominantly in the posterior-medial knee. Mild swelling. Was unable to bear weight on knee for 2 days. Has not had any significant issues with knees for several years. Mild knee locking giving away since this point.   Review of Systems See HPI, otherwise ROS negative    Objective:   Physical Exam Gen: up in chair, NAD HEENT: NCAT, EOMI CV: RRR, no murmurs auscultated PULM: CTAB, no wheezes, rales, rhoncii ABD: S/NT/+ bowel sounds  MSK: Knee: Normal to inspection with no erythema or effusion or obvious bony abnormalities. + TTP aong medial and posterior knee Calves symmetric + Pain with knee flexion and extension Ligaments with solid consistent endpoints including ACL, PCL, LCL, MCL. + mcmurrays, thessaly and apley Non painful patellar compression. EXT: 2+ peripheral pulses         Assessment & Plan:

## 2012-03-16 NOTE — Assessment & Plan Note (Signed)
Filled fentanyl 18mcg/hr #10 monthly and percocet 10-650 # 100 monthly. 0 refills.

## 2012-03-17 ENCOUNTER — Telehealth: Payer: Self-pay | Admitting: *Deleted

## 2012-03-17 NOTE — Telephone Encounter (Signed)
PT CALLED back and I informed him of his appt. [name], Renato Battles

## 2012-03-17 NOTE — Telephone Encounter (Signed)
Left message again to return call. Please tell pt about his appt. [name], Renato Battles

## 2012-03-17 NOTE — Telephone Encounter (Signed)
Left message for patient to return call. Please tell him he has an appointment at [facility] tomorrow 03/18/12 at 2:00 pm for venous doppler. He needs to arrive at 1:45 at 1st floor admitting. Jeffrey [name] also has an order for x-ray which he may do either before or after the doppler.[name], [name]

## 2012-03-18 ENCOUNTER — Ambulatory Visit (HOSPITAL_COMMUNITY)
Admission: RE | Admit: 2012-03-18 | Discharge: 2012-03-18 | Disposition: A | Discharge status: Home / Self Care | Payer: Medicare Other | Source: Ambulatory Visit | Attending: Family Medicine | Admitting: Family Medicine

## 2012-03-18 DIAGNOSIS — M7989 Other specified soft tissue disorders: Secondary | ICD-10-CM

## 2012-03-18 DIAGNOSIS — M25569 Pain in unspecified knee: Secondary | ICD-10-CM | POA: Insufficient documentation

## 2012-03-18 DIAGNOSIS — M79609 Pain in unspecified limb: Secondary | ICD-10-CM | POA: Insufficient documentation

## 2012-03-18 DIAGNOSIS — Q2111 Secundum atrial septal defect: Secondary | ICD-10-CM | POA: Insufficient documentation

## 2012-03-18 DIAGNOSIS — Q211 Atrial septal defect: Secondary | ICD-10-CM | POA: Insufficient documentation

## 2012-03-18 DIAGNOSIS — M25562 Pain in left knee: Secondary | ICD-10-CM

## 2012-03-18 DIAGNOSIS — Z8673 Personal history of transient ischemic attack (TIA), and cerebral infarction without residual deficits: Secondary | ICD-10-CM | POA: Insufficient documentation

## 2012-03-18 NOTE — Progress Notes (Signed)
VASCULAR LAB PRELIMINARY  PRELIMINARY  PRELIMINARY  PRELIMINARY  Left lower extremity venous duplex completed.    Preliminary report:  Left:  No evidence of DVT, or superficial thrombosis. There is an area of mixed echoes extending from the popliteal fossa 4 cm  into the proximal calf consistent with a Baker's cyst which possibly may be ruptured.  Reported called to [facility]  family practice office.   Yaqueline Gutter D, RVS 03/18/2012, 2:43 PM

## 2012-03-24 ENCOUNTER — Encounter: Payer: Self-pay | Admitting: Family Medicine

## 2012-03-24 ENCOUNTER — Telehealth: Payer: Self-pay | Admitting: Family Medicine

## 2012-03-24 NOTE — Telephone Encounter (Signed)
Called to follow up with pt about results of LE venous duplex. Phone number was disconnected. Will send letter.

## 2012-03-25 ENCOUNTER — Telehealth: Payer: Self-pay | Admitting: Family Medicine

## 2012-03-25 NOTE — Telephone Encounter (Signed)
Patient is calling because he wants to know what Dr. Mauro Kaufmann suggests to be done about the knee after having an ultrasound.

## 2012-03-25 NOTE — Telephone Encounter (Signed)
Fwd. To Dr.Newton for advise. [name], Renato Battles

## 2012-03-30 ENCOUNTER — Ambulatory Visit (INDEPENDENT_AMBULATORY_CARE_PROVIDER_SITE_OTHER): Payer: Medicare Other | Admitting: *Deleted

## 2012-03-30 DIAGNOSIS — E291 Testicular hypofunction: Secondary | ICD-10-CM

## 2012-04-05 NOTE — Telephone Encounter (Signed)
Pt needs to see new PCP for f/up.Jeffrey [name]

## 2012-04-05 NOTE — Telephone Encounter (Signed)
Has this been addressed?

## 2012-04-11 ENCOUNTER — Encounter: Payer: Self-pay | Admitting: Family Medicine

## 2012-04-11 ENCOUNTER — Other Ambulatory Visit: Payer: Self-pay | Admitting: Family Medicine

## 2012-04-11 DIAGNOSIS — I639 Cerebral infarction, unspecified: Secondary | ICD-10-CM | POA: Insufficient documentation

## 2012-04-14 ENCOUNTER — Encounter: Payer: Self-pay | Admitting: Family Medicine

## 2012-04-14 ENCOUNTER — Ambulatory Visit (INDEPENDENT_AMBULATORY_CARE_PROVIDER_SITE_OTHER): Payer: Medicare Other | Admitting: Family Medicine

## 2012-04-14 VITALS — BP 126/86 | HR 86 | Temp 97.7°F | Wt 225.5 lb

## 2012-04-14 DIAGNOSIS — E349 Endocrine disorder, unspecified: Secondary | ICD-10-CM

## 2012-04-14 DIAGNOSIS — M549 Dorsalgia, unspecified: Secondary | ICD-10-CM

## 2012-04-14 DIAGNOSIS — E291 Testicular hypofunction: Secondary | ICD-10-CM

## 2012-04-14 DIAGNOSIS — I10 Essential (primary) hypertension: Secondary | ICD-10-CM

## 2012-04-14 MED ORDER — FENTANYL 25 MCG/HR TD PT72: 1.0000 | MEDICATED_PATCH | TRANSDERMAL | Status: DC | PRN

## 2012-04-14 MED ORDER — OXYCODONE-ACETAMINOPHEN 10-650 MG PO TABS: 1.0000 | ORAL_TABLET | Freq: Four times a day (QID) | ORAL | Status: DC | PRN

## 2012-04-14 MED ORDER — AMLODIPINE BESYLATE 10 MG PO TABS: 10.0000 mg | ORAL_TABLET | Freq: Every day | ORAL | Status: DC

## 2012-04-14 MED ORDER — TESTOSTERONE CYPIONATE 200 MG/ML IM SOLN
400.0000 mg | Freq: Once | INTRAMUSCULAR | Status: AC
  Administered 2012-04-14: 400 mg via INTRAMUSCULAR

## 2012-04-14 NOTE — Assessment & Plan Note (Signed)
126/86 today, denies red flags. Better control since adding hydralazine.   Refilled Amlodipine Currently on :Hydralazine 25 TID, Amlodipine 10 qd, doxazosin 2mg  at breakfast and 4 mg at night

## 2012-04-14 NOTE — Patient Instructions (Signed)
Thanks for coming in today.   Good job taking all of your medicines. Please come back next month for follow up.

## 2012-04-14 NOTE — Assessment & Plan Note (Signed)
Chronic back pain stable, patient states that meds are helping at current doses. Dr. Alvester Morin previously communicated that if the need for more pain meds arise that a pain management referral may be warranted. Denies red flags.  Refilled Fentanyl patches x 10, and Percocet 10/625 x 100.  Mistakenly printed off Rx for both with wrong instructions to phamacy, I shredded it personally and gave patient a correct Rx.

## 2012-04-14 NOTE — Progress Notes (Signed)
  Subjective:    Patient ID: Jeffrey [name], male    DOB: [birth year], 51 y.o.   MRN: [id number]  HPI HYPERTENSION States that he doesn't take BP meas at home, denies chest pain, palpitations, and dyspnea. Has som L ankle dependent edema which he states developed after an old accident. States that he tries to keep his sodium intake down.   Chronic Back and hip pain  Back pain mostly at baseline. S/p avascular necrosis and replacement of both hips with revision in L.  Back pain gets worse throughout the day, and with activity. Controlled with current pain med regimen.   Pt on fentanyl patch q3 days and percocet 10-650 # 100 monthly.  Has been on this regimen for 4-5 years. Has not had early refill request during this time period.  R hip pain getting worse, saw Ortho ([facility] [place]) on July 3. Was offered revision of R hip which he said he has declined for now.   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    PMH Smoking Status noted , 1/2 ppd since age of 65      Review of Systems Constitutional: No fever, chills CV: No chest pain, palpitations Resp: No hemoptysis, dyspnea (rest, exertional, paroxysmal nocturnal),  GI: No melena/hematochezia, abdominal pain GU: No dysuria, hematuria, voiding/incontinence  Issues MSK: L ankle swelling, chronic R/L Hip pain, Chronic low back pain, L knee pain improved from last visit.     Objective:   Physical Exam  Gen: NAD, alert, cooperative with exam HEENT: NCAT, EOMI, PERRL CV: RRR, no murmur Resp: CTA, no wheezes, non-labored Ext: Edema of L ankle compared to R Msk: Low back with no bony tenderness, full ROM, + tender to palpation of paraspinal muscles bilaterally.       Assessment & Plan:

## 2012-05-02 ENCOUNTER — Ambulatory Visit (INDEPENDENT_AMBULATORY_CARE_PROVIDER_SITE_OTHER): Payer: Medicare Other | Admitting: *Deleted

## 2012-05-02 DIAGNOSIS — E291 Testicular hypofunction: Secondary | ICD-10-CM

## 2012-05-02 DIAGNOSIS — E349 Endocrine disorder, unspecified: Secondary | ICD-10-CM

## 2012-05-02 MED ORDER — TESTOSTERONE CYPIONATE 200 MG/ML IM SOLN
400.0000 mg | Freq: Once | INTRAMUSCULAR | Status: AC
  Administered 2012-05-02: 400 mg via INTRAMUSCULAR

## 2012-05-18 ENCOUNTER — Other Ambulatory Visit: Payer: Self-pay | Admitting: Family Medicine

## 2012-05-18 DIAGNOSIS — K219 Gastro-esophageal reflux disease without esophagitis: Secondary | ICD-10-CM

## 2012-05-23 ENCOUNTER — Ambulatory Visit (INDEPENDENT_AMBULATORY_CARE_PROVIDER_SITE_OTHER): Payer: Medicare Other | Admitting: *Deleted

## 2012-05-23 DIAGNOSIS — E349 Endocrine disorder, unspecified: Secondary | ICD-10-CM

## 2012-05-23 DIAGNOSIS — E291 Testicular hypofunction: Secondary | ICD-10-CM

## 2012-05-23 NOTE — Progress Notes (Signed)
Patient in office for testosterone injection and needs appointment with PCP for refill on pain medication. States he has been out of town . Current RX will run out in two days.  Dr. Felipa Emory first available appointment is 09/05 and this is scheduled . Will send message to Dr. Ermalinda Memos to ask if he will refill enough until that appointment.

## 2012-05-24 ENCOUNTER — Other Ambulatory Visit: Payer: Self-pay | Admitting: Family Medicine

## 2012-05-24 ENCOUNTER — Encounter: Payer: Self-pay | Admitting: Family Medicine

## 2012-05-24 ENCOUNTER — Telehealth: Payer: Self-pay | Admitting: *Deleted

## 2012-05-24 ENCOUNTER — Ambulatory Visit (INDEPENDENT_AMBULATORY_CARE_PROVIDER_SITE_OTHER): Payer: Medicare Other | Admitting: Family Medicine

## 2012-05-24 VITALS — BP 125/76 | HR 88 | Temp 98.6°F | Ht 72.0 in | Wt 231.0 lb

## 2012-05-24 DIAGNOSIS — M25572 Pain in left ankle and joints of left foot: Secondary | ICD-10-CM

## 2012-05-24 DIAGNOSIS — M25579 Pain in unspecified ankle and joints of unspecified foot: Secondary | ICD-10-CM

## 2012-05-24 DIAGNOSIS — M549 Dorsalgia, unspecified: Secondary | ICD-10-CM

## 2012-05-24 MED ORDER — TESTOSTERONE CYPIONATE 200 MG/ML IM SOLN
400.0000 mg | Freq: Once | INTRAMUSCULAR | Status: AC
  Administered 2012-05-23: 400 mg via INTRAMUSCULAR

## 2012-05-24 MED ORDER — OXYCODONE-ACETAMINOPHEN 10-650 MG PO TABS: 1.0000 | ORAL_TABLET | Freq: Four times a day (QID) | ORAL | Status: DC | PRN

## 2012-05-24 MED ORDER — DICLOFENAC SODIUM 3 % TD GEL: TRANSDERMAL | Status: DC

## 2012-05-24 MED ORDER — FENTANYL 25 MCG/HR TD PT72: 1.0000 | MEDICATED_PATCH | TRANSDERMAL | Status: DC | PRN

## 2012-05-24 MED ORDER — DICLOFENAC SODIUM 1 % TD GEL: 2.0000 g | Freq: Three times a day (TID) | TRANSDERMAL | Status: DC

## 2012-05-24 NOTE — Progress Notes (Signed)
Subjective:     Patient ID: Jeffrey [name], male   DOB: Feb 03, 1961, 51 y.o.   MRN: [id number]  HPI Patient is a 51 yo M with multiple sites of pain presenting for worsening left ankle pain. He states he had an unknown injury to his ankle 6 years ago. He had intermittent pain and was diagnosed with posterior malleolar fracture a while after the injury. At that point it was healing well and no intervention was made. He states for the last month he has had worsening pain and swelling of the ankle. He is on percocet and fentanyl patch chronically for pain which does not help his pain. He also uses ice qhs on the ankle. He states the pain does interfere with his daily life as he cannot put much weight on the ankle. He states his medial ankle is TTP and feels like it has a "big bruise." Denies any new injury. No discoloration of ankle, although has some redness and increased swelling. No fevers, chills, calf pain. He does state he mostly wears tennis shoes. He occasionally will wear flat sandals but not for long.   In the past he has seen ortho in [facility] for his hips, but he states that MD was a "hip specialist" and he would like to see an ortho specialist in town for his ankle.  History reviewed: Nonsmoker  Review of Systems See HPI above    Objective:   Physical Exam  Constitutional: He appears well-developed and well-nourished. No distress.  HENT:  Head: Normocephalic and atraumatic.  Musculoskeletal:       Left ankle: He exhibits decreased range of motion and swelling. He exhibits no ecchymosis, no deformity and no laceration. tenderness. Medial malleolus tenderness found. Achilles tendon normal.       Pain with plantarflexion and dorsiflexion. Pain with inversion, not as bad with eversion. No tenderness to with palpation to Achilles. Negative ankle anterior drawer test.  Lymphadenopathy:    He has no cervical adenopathy.      Assessment:     51 yo M with left ankle pain progressively  worsening    Plan:     See problem list

## 2012-05-24 NOTE — Assessment & Plan Note (Signed)
Progressively worsening ankle pain with known old injury, but no new injury. He is on chronic pain medication from Dr. Ermalinda Memos, and he was given paper Rx from Dr. Ermalinda Memos today. For now, patient should keep ankle elevated. Continue using ice as needed. Will put in referral for ortho for evaluation. Also, will send Rx for Diclofenac 1% gel to use as needed locally for ankle pain. Patient is not a candidate for PO NSAID given his chronic renal insuffiencey. Patient agrees with this plan. Will follow up with Dr. Ermalinda Memos as scheduled in 2 weeks.

## 2012-05-24 NOTE — Telephone Encounter (Signed)
PA required for Voltaren gel . Form completed by Dr. Mikel Cella and faxed to insurance.

## 2012-05-24 NOTE — Patient Instructions (Addendum)
I have placed a referral for Ortho. They will give you a call when your appointment has been made. In the meantime, continue pain medication and ice to the area. I have also sent in a gel to use on your skin for symptomatic relief.   Take care! Please follow up with Dr. Ermalinda Memos as scheduled. Deania Siguenza M. Kabrina Christiano, M.D.

## 2012-05-24 NOTE — Progress Notes (Signed)
Dr. Ermalinda Memos printed RX . RX given to patient.

## 2012-05-24 NOTE — Telephone Encounter (Signed)
Pt came to clinic for testeoerone injection and said he needed refill on pain meds. He has been out of town and is now scheduled to see me on 9/5. This is on his normal schedule of refilling around the 20th.   Refilled percocet 10/650 #100, and fentanyl patch 25 mcg/hr #10 Plan to refill next month's pain meds at next visit with Do Not Refill until 06/24/2012 modifier

## 2012-05-26 NOTE — Telephone Encounter (Signed)
Approval received and pharmacy notified. 

## 2012-06-09 ENCOUNTER — Encounter: Payer: Self-pay | Admitting: Family Medicine

## 2012-06-09 ENCOUNTER — Ambulatory Visit (INDEPENDENT_AMBULATORY_CARE_PROVIDER_SITE_OTHER): Payer: Medicare Other | Admitting: Family Medicine

## 2012-06-09 VITALS — BP 100/68 | HR 77 | Ht 72.0 in | Wt 226.0 lb

## 2012-06-09 DIAGNOSIS — F17200 Nicotine dependence, unspecified, uncomplicated: Secondary | ICD-10-CM

## 2012-06-09 DIAGNOSIS — E291 Testicular hypofunction: Secondary | ICD-10-CM

## 2012-06-09 DIAGNOSIS — M549 Dorsalgia, unspecified: Secondary | ICD-10-CM

## 2012-06-09 MED ORDER — TESTOSTERONE CYPIONATE 200 MG/ML IM SOLN
400.0000 mg | Freq: Once | INTRAMUSCULAR | Status: AC
  Administered 2012-06-09: 400 mg via INTRAMUSCULAR

## 2012-06-09 MED ORDER — OXYCODONE-ACETAMINOPHEN 10-650 MG PO TABS: 1.0000 | ORAL_TABLET | Freq: Four times a day (QID) | ORAL | Status: DC | PRN

## 2012-06-09 MED ORDER — FENTANYL 25 MCG/HR TD PT72: 1.0000 | MEDICATED_PATCH | TRANSDERMAL | Status: DC | PRN

## 2012-06-09 NOTE — Patient Instructions (Signed)
Thanks for coming in today  I have refilled your pain medications at your normal doses Be sure to keep your orthopedic appointment on Oct 9.

## 2012-06-09 NOTE — Progress Notes (Signed)
  Subjective:    Patient ID: Jeffrey [name], male    DOB: [birth year], 51 y.o.   MRN: [id number]  HPI Here for follow up of chronic pain and smoking cessation  Chronic pain is in his low back and BL hips, has had BL hip replacement and revision of the L. Takes avg of 3 pills a day, one at breakfast, one after lunch and one at night. States that he has pain each time prompting him to take his medication. Not really open to increasing fentanyl patch and decreasing number of percocet as fentanyl patches fall off of him sometimes and theyre wasted. Has pain and stiffness when he wakes up in the Am. Denies constipation but states that he was constipated after his surgery when they gave him morphine. Asked him if he would be willing to take a drug test today and he refused.   Smoking. Has tried to quit multiple times and had the best results with chantix. Has tried patch, gum, inhaler. Says he's 50/50 now on quitting. Benefits are calming his nerves, cons are coughing and risk of getting sick.   L ankle pain continued from visit earlier in the month. No fluctuation in selling, voltaren gel and ice not helping. Has an ortho appt this month.   Review of Systems Constitutional: No fever/chills CV: No chest pain,  Resp: No  dyspnea  GI: No nausea vomiting diarrhea MSK: L ankle pain and swelling.     Objective:   Physical Exam  Gen: NAD, alert, cooperative with exam, walks with a cane HEENT: NCAT CV: RRR, no murmur Resp: CTABL, no wheezes, non-labored Ext: L ankle grossly larger than the right, limited ROM of L ankle with flexion and extension, full ROM, no erythema or warmth Neuro: Alert and oriented, no gross deficits    Assessment & Plan:

## 2012-06-09 NOTE — Assessment & Plan Note (Signed)
Not ready to quit. Offered assistance if he changes his mind. Plan to consult Paulino Rily if he wants to quit tin the future.

## 2012-06-09 NOTE — Assessment & Plan Note (Signed)
Stable and still controlled with current regimen. Refused urine drug test, plan to acquire contract and urine drug screen at next visit. Wants more than one months Rx at a time, I explained I was wasn't comfortable with that.   Rx: Percocet 10/325 number 100 Fentanyl transdermal 58mcg/hr number 10

## 2012-06-13 ENCOUNTER — Other Ambulatory Visit: Payer: Self-pay | Admitting: Family Medicine

## 2012-06-13 MED ORDER — CARVEDILOL 25 MG PO TABS: 25.0000 mg | ORAL_TABLET | Freq: Two times a day (BID) | ORAL | Status: DC

## 2012-06-16 ENCOUNTER — Other Ambulatory Visit: Payer: Self-pay | Admitting: Family Medicine

## 2012-06-28 ENCOUNTER — Ambulatory Visit (INDEPENDENT_AMBULATORY_CARE_PROVIDER_SITE_OTHER): Payer: Medicare Other | Admitting: *Deleted

## 2012-06-28 DIAGNOSIS — E349 Endocrine disorder, unspecified: Secondary | ICD-10-CM

## 2012-06-28 DIAGNOSIS — E291 Testicular hypofunction: Secondary | ICD-10-CM

## 2012-06-28 MED ORDER — TESTOSTERONE CYPIONATE 200 MG/ML IM SOLN
400.0000 mg | Freq: Once | INTRAMUSCULAR | Status: AC
  Administered 2012-06-28: 400 mg via INTRAMUSCULAR

## 2012-06-29 ENCOUNTER — Ambulatory Visit: Payer: Medicare Other

## 2012-07-14 ENCOUNTER — Other Ambulatory Visit: Payer: Self-pay | Admitting: Family Medicine

## 2012-07-15 ENCOUNTER — Other Ambulatory Visit: Payer: Self-pay | Admitting: Family Medicine

## 2012-07-15 ENCOUNTER — Ambulatory Visit: Payer: Medicare Other | Admitting: Family Medicine

## 2012-07-15 MED ORDER — DOXAZOSIN MESYLATE 4 MG PO TABS: 4.0000 mg | ORAL_TABLET | Freq: Every day | ORAL | Status: DC

## 2012-08-04 ENCOUNTER — Encounter: Payer: Self-pay | Admitting: Family Medicine

## 2012-08-04 ENCOUNTER — Ambulatory Visit (INDEPENDENT_AMBULATORY_CARE_PROVIDER_SITE_OTHER): Payer: Medicare Other | Admitting: Family Medicine

## 2012-08-04 VITALS — BP 119/76 | HR 86 | Ht 72.0 in | Wt 224.0 lb

## 2012-08-04 DIAGNOSIS — I10 Essential (primary) hypertension: Secondary | ICD-10-CM

## 2012-08-04 DIAGNOSIS — M549 Dorsalgia, unspecified: Secondary | ICD-10-CM

## 2012-08-04 DIAGNOSIS — E291 Testicular hypofunction: Secondary | ICD-10-CM

## 2012-08-04 MED ORDER — TESTOSTERONE CYPIONATE 200 MG/ML IM SOLN
400.0000 mg | Freq: Once | INTRAMUSCULAR | Status: AC
  Administered 2012-08-04: 400 mg via INTRAMUSCULAR

## 2012-08-04 NOTE — Assessment & Plan Note (Addendum)
Controlled on current medications.  Headaches for the last month very suspicious for medication overuse vs migraine. I do not think that they are related to his HTN because it is so well controlled today in the office, lack of other symptoms, and because he states he is compliant with all of his medications.   No chest pain, dyspnea, blurred vision, or dizziness.   Continue current meds: amlodipine, coreg, doxazosin, and hydralazine.

## 2012-08-04 NOTE — Progress Notes (Signed)
  Subjective:    Patient ID: Jeffrey [name], male    DOB: [birth year], 51 y.o.   MRN: [id number]  HPI Pt here to f/u for chronic pain and blood pressure.  Blood pressure: Taking meds as prescribed Having daily headaches daily for 1 month, described as frontal, dull, with gradual onset and decline, lasting nearly all day.  No chest pain, dyspnea, blurred vision, or palpitations.   Chronic back pain Continued, worsened currently at 8/10 because he states that he has been out of medication for about 10 days. Explains there was an emergency with a friend and he had to leave the state, so he had to re-schedule our visit and missed his two week testosterone injection also.  Denies diarrhea with discontinuation of opiates  States that he can "tell when he is not taking his meds and he can tell when he has taken them" , saying again he's been out of medication for several days and has not filled a prescription since September.  States that he is getting fitted for a boot for his L ankle today, and having a R hip revision in January Still able to all of his ADLs States he uses marijuana occasionally for sleeping States he feels that urine or blood testing is an invasion of his privacy but agrees to give them in order to continue opiate prescriptions.  When asked about the information from the state database and told that because of the inconsistency that I can no longer prescribe these medications he stated "this is bullshit, you'll hear from my lawyer" and stormed out of the office.    Review of Systems Constitutional: no fevers, chills, sweats HEENT: No rhinnorhea, + congestion Resp: No dyspnea CV: No chest pain or palpitations MSK: + pain in low back, hips, and L ankle.     Objective:   Physical Exam Gen: NAD, alert, cooperative with exam, appearing comfortable HEENT: NCAT, EOMI, PERRL,  CV: RRR, no murmur, good S1/S2 Resp: CTABL, no wheezes, non-labored Neuro: Alert and oriented, No gross  deficits, walks with a cane     Assessment & Plan:

## 2012-08-04 NOTE — Assessment & Plan Note (Addendum)
Chronic back pain increased since he ran out of medications approx 10 days ago. States he has 8/10 pain today, still able to do all adl's.   Notably he denies any symptoms of withdrawal when he ran out of medications 10 days ago. He does note that he found 1 percocet 2 days a go and took it at that time.  He signed a pain contract and agreed to urine screen after I told him he had to agree to the entire contract or I could not continue to prescribe his narcotics. At that time he disclosed that he occasionally smokes marijuana and agreed to the test.  I precepted with Dr. Mauricio Po and reviewed the state database which reflected a disbursement of fentanyl patches #10 on 10/23 and percocet #100 on 10/10 which are both inconsistent with his story.  I told him that because of this inconsistency I could no longer prescribe narcotics for him. He responded "this is bullshit, you'll hear from my lawyer"  and he stormed out of the office. I offered him a referral to a pain clinic which he refused and asked that he come back in 4 weeks to f/u for his testosterone need to which his response was "yeah right."   I feel he has real pain and I would like to treat it with non-narcotic medications, but as he stormed out I did not have time to discuss this possibility or even write a prescription before he left.

## 2012-08-26 ENCOUNTER — Other Ambulatory Visit: Payer: Self-pay | Admitting: Family Medicine

## 2012-08-29 ENCOUNTER — Telehealth: Payer: Self-pay | Admitting: Family Medicine

## 2012-08-29 NOTE — Telephone Encounter (Signed)
Returned call to patient.  Patient requesting to change PCP (male or male).  States he has been on Percocet 10/650 mg and Fentanyl patches for 6+ years.  Will be having another total hip replacement in January 2013.  At last office visit in October, Dr. Ermalinda Memos made a "smart-ass comment about referring me to a pain clinic."  Patient signed a pain contract and was supposed to give a urine sample for a drug screen.  Patient did not give a urine sample and "does not agree with it because it is an invasion of privacy."  Patient requesting Rx for pain (narcotic or non-narcotic) be called in to [facility] on [address].  Patient informed that he will likely need an office visit for evaluation/discuss meds.  Will route note to Dr. Deirdre Priest and Dr. Ermalinda Memos for advice and call patient back.  Gaylene Brooks, RN

## 2012-08-29 NOTE — Telephone Encounter (Signed)
Pt calling in to voice a complaint concerning his pain meds. He stated that he has been on the pain meds for the past 6 years. He stated that he is not looking for any narcotic med just something that will help with his pain and to help to sleep. He stated that he has been coming here for years and felt that the way that he was treated by Dr. Ermalinda Memos was unfair. He would like for the person that is over Dr. Ermalinda Memos to call him back in regards to his complaint.Loralee Pacas [place]

## 2012-08-30 ENCOUNTER — Other Ambulatory Visit: Payer: Self-pay | Admitting: Family Medicine

## 2012-08-30 NOTE — Telephone Encounter (Signed)
Last date of service 08/29/02. Chart in storage.

## 2012-08-30 NOTE — Telephone Encounter (Signed)
Please pull paper chart.  

## 2012-08-30 NOTE — Telephone Encounter (Signed)
Pt called back asking if this medicine has been called in.  Told him that the nurse that talked with him yesterday was in a meeting and would get back with him as soon as she hears from the other doctors.

## 2012-08-31 NOTE — Telephone Encounter (Signed)
I reviewed Jeffrey [name] medical record, the [place] controlled substance db and spoke with Dr Ermalinda Memos and Jeffrey [name].  Given Jeffrey [name] not coming in on time for his refills, his telling Dr Ermalinda Memos that he was out of narcotics ( he relates to me he had more fentanyl patches and home but had not worn one since he had just showered and forgot to tell Dr Ermalinda Memos they were at home) and Jeffrey [name] abrupt behavior at the end of his recent office visit and admitted marijuana use I feel he is not a good candidate for primary care administration of controlled substances.  Jeffrey [name] would like to be changed to a new PCP I informed him he may do that once.  He would like to be referred to a pain center.  We will arrange these things but will not prescribe controlled substances except for acute objective time limited injuries

## 2012-09-05 ENCOUNTER — Other Ambulatory Visit: Payer: Self-pay | Admitting: *Deleted

## 2012-09-05 DIAGNOSIS — I10 Essential (primary) hypertension: Secondary | ICD-10-CM

## 2012-09-05 MED ORDER — HYDRALAZINE HCL 25 MG PO TABS: ORAL_TABLET | ORAL | Status: DC

## 2012-09-06 ENCOUNTER — Other Ambulatory Visit: Payer: Self-pay | Admitting: *Deleted

## 2012-09-06 DIAGNOSIS — I10 Essential (primary) hypertension: Secondary | ICD-10-CM

## 2012-09-06 MED ORDER — HYDRALAZINE HCL 25 MG PO TABS: ORAL_TABLET | ORAL | Status: DC

## 2012-09-06 NOTE — Telephone Encounter (Addendum)
Will change PCP to Dr. Roslynn Amble.  Per Dr. Jacqlyn Krauss to pain clinic will be discussed at office visit with new PCP.  Returned call to patient and left message to call our office back.  Gaylene Brooks, RN

## 2012-09-08 ENCOUNTER — Other Ambulatory Visit: Payer: Self-pay | Admitting: *Deleted

## 2012-09-08 ENCOUNTER — Telehealth: Payer: Self-pay | Admitting: Family Medicine

## 2012-09-08 DIAGNOSIS — I10 Essential (primary) hypertension: Secondary | ICD-10-CM

## 2012-09-08 NOTE — Telephone Encounter (Signed)
Patient is returning call to [place].

## 2012-09-09 MED ORDER — HYDRALAZINE HCL 25 MG PO TABS: ORAL_TABLET | ORAL | Status: DC

## 2012-09-09 NOTE — Telephone Encounter (Signed)
Spoke with patient and changed PCP to Dr. Roslynn Amble.  Appt scheduled to meet new MD on 10/03/12 @ 1:30 pm.  Gaylene Brooks, RN

## 2012-09-09 NOTE — Telephone Encounter (Signed)
See phone note from 08/29/12 Gaylene Brooks, RN

## 2012-09-29 ENCOUNTER — Encounter: Payer: Self-pay | Admitting: *Deleted

## 2012-09-29 DIAGNOSIS — I10 Essential (primary) hypertension: Secondary | ICD-10-CM

## 2012-09-30 ENCOUNTER — Other Ambulatory Visit: Payer: Self-pay | Admitting: *Deleted

## 2012-09-30 DIAGNOSIS — I10 Essential (primary) hypertension: Secondary | ICD-10-CM

## 2012-09-30 MED ORDER — HYDRALAZINE HCL 25 MG PO TABS: ORAL_TABLET | ORAL | Status: DC

## 2012-10-03 ENCOUNTER — Encounter: Payer: Self-pay | Admitting: Family Medicine

## 2012-10-03 ENCOUNTER — Ambulatory Visit (INDEPENDENT_AMBULATORY_CARE_PROVIDER_SITE_OTHER): Payer: Medicare Other | Admitting: Family Medicine

## 2012-10-03 VITALS — BP 154/100 | HR 99 | Ht 72.0 in | Wt 216.9 lb

## 2012-10-03 DIAGNOSIS — M549 Dorsalgia, unspecified: Secondary | ICD-10-CM

## 2012-10-03 DIAGNOSIS — G8929 Other chronic pain: Secondary | ICD-10-CM

## 2012-10-03 MED ORDER — FENTANYL 25 MCG/HR TD PT72: 1.0000 | MEDICATED_PATCH | TRANSDERMAL | Status: DC | PRN

## 2012-10-03 NOTE — Progress Notes (Signed)
  Subjective:    Patient ID: Jeffrey [name], male    DOB: 06-10-1961, 51 y.o.   MRN: [id number]  HPI  51 year old M who presents to me for a new patient visit. He was previously cared for by another physician at [facility] and requested a transfer of care. According to records, the patient was seen on 08/04/12 for an evaluation for his chronic pain. At that time, his PCP noted dispersements of pain medication that Jeffrey [name] originally did not divulge. Thus, his physician felt that this violated Jeffrey [name] pain contract and denied him refills to his chronic percocet and fentanyl patches. Since that time, Jeffrey [name] states that he has not had any narcotic pain medications and had withdrawal symptoms. Additionally, he feels very depressed. He would like to resume care for chronic pain. He continuously refutes the idea of that he violated his contract and notes that Jeffrey [name], his previous PCP, prescribed medications to him for years without problems. He is extremely upset with what her considers to be disrespectful treatment that he received from our clinicians recently.   Chronic Pain: Current Scale - 10/10 Location - Lower back, hips bilaterally, left ankle Cause - Avascular necrosis of hip treated with bilateral hip replacements by Dr. Andrey Campanile and [facility]; AVN of left ankle diagnosed at [facility] ortho per patient report he refused surgery Previous Medical Treatment - Oxycodone-Acetaminophen 10-650 q 6 hours PRN, Fentanyl 25 mcg patch q 72 hours   Review of Systems Positive for depression, numbness and tingling in legs, headache, swelling of extremities    Objective:   Physical Exam BP 154/100  Pulse 99  Ht 6' (1.829 m)  Wt 216 lb 14.4 oz (98.385 kg)  BMI 29.42 kg/m2 Gen: middle age WM, in distress, emotionally labile MSK: marked edema of left lateral malleolus with decreased ROM, right ankle without edema and full ROM Neuro: antalgic gait using cane     Assessment & Plan:   51 year old M with history of avascular necrosis of his hips and left ankle causing chronic pain. He has severe MSK pain linked to a debilitating condition and unfortunately there was a discrepancy with his prescribed medication usage according to his previous PCP.  He was persistent that he did not violate his contract and is seeking treatment for his pain. He was counseled extensively about the long term need for consistent pain management given his chronic condition. I explained the benefits of pain management clinic in providing focused continuity of care for his pain compared with our residents who are on 3 year cycles. He acknowledged this advantage and is agreeable to a referral to long term pain clinic. At this point, I will prescribe him enough fentanyl patches for one month and explained that I could not give him narcotic pills. He was agreeable to this. He will return for continued care for other health issues.

## 2012-10-03 NOTE — Patient Instructions (Signed)
Mr. Joss,   I have made a referral to a pain management clinic. We will contact you with the information about the visit. Use the patches as needed until that point. Additionally, please return to clinic within 4 weeks for blood pressure check.   Sincerely,   Dr. Clinton Sawyer

## 2012-10-05 NOTE — Assessment & Plan Note (Signed)
Referral to pain management clinic for further care.

## 2012-10-14 NOTE — Telephone Encounter (Signed)
This encounter was created in error - please disregard.

## 2012-10-18 ENCOUNTER — Telehealth: Payer: Self-pay | Admitting: Family Medicine

## 2012-10-18 NOTE — Telephone Encounter (Signed)
Patient is calling to check the status of the Pain Referral.

## 2012-10-20 NOTE — Telephone Encounter (Signed)
Left message for patient to return call. Please tell him the referral was Faxed to [facility]. If he has not heard from them yet he can call their office at [phone].Yaniv Lage, [name]

## 2012-10-21 NOTE — Telephone Encounter (Signed)
Patient called and was given the message below.

## 2012-11-04 ENCOUNTER — Ambulatory Visit: Payer: Medicare Other | Admitting: Family Medicine

## 2012-11-23 ENCOUNTER — Other Ambulatory Visit: Payer: Self-pay | Admitting: Pain Medicine

## 2012-11-23 DIAGNOSIS — M545 Low back pain, unspecified: Secondary | ICD-10-CM

## 2012-11-23 DIAGNOSIS — M542 Cervicalgia: Secondary | ICD-10-CM

## 2012-11-29 ENCOUNTER — Other Ambulatory Visit: Payer: Self-pay | Admitting: Family Medicine

## 2012-11-30 ENCOUNTER — Ambulatory Visit
Admission: RE | Admit: 2012-11-30 | Discharge: 2012-11-30 | Disposition: A | Discharge status: Home / Self Care | Payer: Medicare Other | Source: Ambulatory Visit | Attending: Pain Medicine | Admitting: Pain Medicine

## 2012-11-30 DIAGNOSIS — M542 Cervicalgia: Secondary | ICD-10-CM

## 2012-11-30 DIAGNOSIS — M545 Low back pain, unspecified: Secondary | ICD-10-CM

## 2012-12-27 ENCOUNTER — Other Ambulatory Visit: Payer: Self-pay | Admitting: Family Medicine

## 2012-12-27 NOTE — Telephone Encounter (Signed)
Pt needs this refill

## 2012-12-28 ENCOUNTER — Telehealth: Payer: Self-pay | Admitting: Family Medicine

## 2012-12-28 DIAGNOSIS — F329 Major depressive disorder, single episode, unspecified: Secondary | ICD-10-CM

## 2012-12-28 DIAGNOSIS — F32A Depression, unspecified: Secondary | ICD-10-CM

## 2012-12-28 MED ORDER — CITALOPRAM HYDROBROMIDE 20 MG PO TABS: 20.0000 mg | ORAL_TABLET | Freq: Two times a day (BID) | ORAL | Status: DC

## 2012-12-28 MED ORDER — BUPROPION HCL ER (SR) 150 MG PO TB12: 150.0000 mg | ORAL_TABLET | Freq: Two times a day (BID) | ORAL | Status: DC

## 2012-12-28 NOTE — Telephone Encounter (Signed)
Presriptions refilled and sent to pharmacy. Patient called and made aware.

## 2012-12-28 NOTE — Telephone Encounter (Signed)
Along with the Doxycycline, the patient also needs refills on Celexa and Bupropion and he is completely out of both.

## 2012-12-28 NOTE — Telephone Encounter (Signed)
Will forward to Dr Clinton Sawyer

## 2013-01-23 ENCOUNTER — Other Ambulatory Visit: Payer: Self-pay | Admitting: Family Medicine

## 2013-01-23 ENCOUNTER — Encounter: Payer: Self-pay | Admitting: Family Medicine

## 2013-01-23 ENCOUNTER — Ambulatory Visit (INDEPENDENT_AMBULATORY_CARE_PROVIDER_SITE_OTHER): Payer: Medicare Other | Admitting: Family Medicine

## 2013-01-23 VITALS — BP 147/90 | HR 79 | Ht 72.0 in | Wt 214.0 lb

## 2013-01-23 DIAGNOSIS — Z96649 Presence of unspecified artificial hip joint: Secondary | ICD-10-CM

## 2013-01-23 DIAGNOSIS — I10 Essential (primary) hypertension: Secondary | ICD-10-CM

## 2013-01-23 DIAGNOSIS — E291 Testicular hypofunction: Secondary | ICD-10-CM

## 2013-01-23 LAB — TESTOSTERONE: Testosterone: 96 ng/dL — ABNORMAL LOW (ref 300–890)

## 2013-01-23 NOTE — Patient Instructions (Addendum)
We will check cobalt and chromium labs today. I will try to find out how often those should be check. Also, I will check the testosterone level today to see what level you have. I do have concerns about long term testosterone replacement.   You also need a colonoscopy per our records.   Please follow up in 2 weeks,   Dr. Clinton Sawyer

## 2013-01-23 NOTE — Progress Notes (Signed)
  Subjective:    Patient ID: Jeffrey [name], male    DOB: Dec 10, 1960, 52 y.o.   MRN: [id number]  HPI  52 year old M with hypertension   1. Hypertension  Home BP monitoring:  BP Readings from Last 3 Encounters:  01/23/13 147/90  10/03/12 154/100  08/04/12 119/76    Prescribed meds: amlodipine 10 mg, Cardura 4 mg QHS, carvedilol 25 mg BID, hydralazine   Hypertension ROS: taking medications as instructed, no medication side effects noted, no TIA's, no chest pain on exertion, no dyspnea on exertion and no swelling of ankles  2. Testosterone Deficiency - Diagnosed several years ago after feeling prolonged fatigue, notes a family history of testosterone deficiency; previously taken injections of testosterone which improved fatigue, decreased libido, ability to maintain an erection and ejaculation; has not had injection of testosterone since November according to our records   3. History of Left Hip Replacement - Concern for chromium and cobalt in blood b/c history of metal-on-metal hip implant on left side; metals in blood last checked in May 2013 by orthopedic surgeon at [facility] and told that levels were normal; He was not told how frequently these labs should be checked; patient denies local skin reaction, denies new weakness or numbness of extremities   Past Surgical History  Procedure Laterality Date  . Total hip arthroplasty Bilateral     left x 2; right x 1     Review of Systems Positive for hip and back pain     Objective:   Physical Exam BP 147/90  Pulse 79  Ht 6' (1.829 m)  Wt 214 lb (97.07 kg)  BMI 29.02 kg/m2  Gen: WM, non ill appearing, mildly distressed CV: RRR, no murmurs, no peripheral edema Genital: declined by patient      Assessment & Plan:  52 year old M with poorly controlled HTN and concern for heavy metal exposure and testosterone deficiency.

## 2013-01-29 ENCOUNTER — Encounter: Payer: Self-pay | Admitting: Family Medicine

## 2013-01-29 DIAGNOSIS — Z96649 Presence of unspecified artificial hip joint: Secondary | ICD-10-CM | POA: Insufficient documentation

## 2013-01-29 NOTE — Assessment & Plan Note (Signed)
Check chromium and cobalt levels at patient's request. After checking on FDA website, there is no recommendation for screening for asymptomatic patient's. I will share this with patient and defer any future checks to orthopedic surgeon.

## 2013-01-29 NOTE — Assessment & Plan Note (Signed)
Check patient's testosterone level today since has documented history of low testosterone. If low, will restart treatment as long as no signs or symptoms of prostate cancer. Also spoke to patient about unknown risks of long term hormone treatment.

## 2013-01-29 NOTE — Assessment & Plan Note (Signed)
No changes today. Consider increasing hydralazine if persistent.

## 2013-02-07 ENCOUNTER — Other Ambulatory Visit: Payer: Medicare Other

## 2013-02-08 ENCOUNTER — Other Ambulatory Visit: Payer: Medicare Other

## 2013-02-08 ENCOUNTER — Other Ambulatory Visit: Payer: Self-pay | Admitting: Pain Medicine

## 2013-02-08 ENCOUNTER — Other Ambulatory Visit: Payer: Self-pay | Admitting: Family Medicine

## 2013-02-08 DIAGNOSIS — M25552 Pain in left hip: Secondary | ICD-10-CM

## 2013-02-08 NOTE — Progress Notes (Signed)
REPEAT CHROMIUM DONE TODAY Jeffrey [name]

## 2013-02-11 ENCOUNTER — Ambulatory Visit
Admission: RE | Admit: 2013-02-11 | Discharge: 2013-02-11 | Disposition: A | Discharge status: Home / Self Care | Payer: Medicare Other | Source: Ambulatory Visit | Attending: Pain Medicine | Admitting: Pain Medicine

## 2013-02-11 DIAGNOSIS — M25552 Pain in left hip: Secondary | ICD-10-CM

## 2013-02-13 LAB — CHROMIUM LEVEL: Chromium: 3.9 mcg/L — ABNORMAL HIGH

## 2013-02-15 ENCOUNTER — Encounter: Payer: Self-pay | Admitting: Family Medicine

## 2013-03-01 HISTORY — PX: OTHER SURGICAL HISTORY: SHX169

## 2013-03-13 ENCOUNTER — Encounter: Payer: Self-pay | Admitting: Family Medicine

## 2013-03-16 ENCOUNTER — Telehealth: Payer: Self-pay | Admitting: Family Medicine

## 2013-03-16 NOTE — Telephone Encounter (Signed)
Pt needs printout of medications to take to the dentist at 2pm today. He will pick this up Please advise

## 2013-03-16 NOTE — Telephone Encounter (Signed)
List printed out for pt. Jeffrey [name], Jeffrey [name]

## 2013-06-12 ENCOUNTER — Other Ambulatory Visit: Payer: Self-pay | Admitting: Family Medicine

## 2013-06-12 DIAGNOSIS — I10 Essential (primary) hypertension: Secondary | ICD-10-CM

## 2013-06-12 MED ORDER — AMLODIPINE BESYLATE 10 MG PO TABS: 10.0000 mg | ORAL_TABLET | Freq: Every day | ORAL | Status: DC

## 2013-06-14 ENCOUNTER — Other Ambulatory Visit: Payer: Self-pay | Admitting: Family Medicine

## 2013-06-30 ENCOUNTER — Ambulatory Visit (HOSPITAL_COMMUNITY)
Admission: RE | Admit: 2013-06-30 | Discharge: 2013-06-30 | Disposition: A | Discharge status: Home / Self Care | Payer: Medicare Other | Source: Ambulatory Visit | Attending: Family Medicine | Admitting: Family Medicine

## 2013-06-30 ENCOUNTER — Encounter: Payer: Self-pay | Admitting: Family Medicine

## 2013-06-30 ENCOUNTER — Ambulatory Visit (INDEPENDENT_AMBULATORY_CARE_PROVIDER_SITE_OTHER): Payer: Medicare Other | Admitting: Family Medicine

## 2013-06-30 VITALS — BP 106/72 | HR 87 | Temp 98.6°F | Wt 201.0 lb

## 2013-06-30 DIAGNOSIS — Z01810 Encounter for preprocedural cardiovascular examination: Secondary | ICD-10-CM

## 2013-06-30 DIAGNOSIS — Z720 Tobacco use: Secondary | ICD-10-CM

## 2013-06-30 DIAGNOSIS — Z01818 Encounter for other preprocedural examination: Secondary | ICD-10-CM

## 2013-06-30 DIAGNOSIS — E291 Testicular hypofunction: Secondary | ICD-10-CM

## 2013-06-30 DIAGNOSIS — F17200 Nicotine dependence, unspecified, uncomplicated: Secondary | ICD-10-CM

## 2013-06-30 LAB — CBC
HCT: 40.5 % (ref 39.0–52.0)
Hemoglobin: 14.1 g/dL (ref 13.0–17.0)
MCV: 95.5 fL (ref 78.0–100.0)
RBC: 4.24 MIL/uL (ref 4.22–5.81)
RDW: 12.8 % (ref 11.5–15.5)
WBC: 10.6 10*3/uL — ABNORMAL HIGH (ref 4.0–10.5)

## 2013-06-30 LAB — COMPREHENSIVE METABOLIC PANEL
AST: 16 U/L (ref 0–37)
Albumin: 4.2 g/dL (ref 3.5–5.2)
BUN: 27 mg/dL — ABNORMAL HIGH (ref 6–23)
CO2: 29 mEq/L (ref 19–32)
Calcium: 10.2 mg/dL (ref 8.4–10.5)
Chloride: 108 mEq/L (ref 96–112)
Potassium: 4.2 mEq/L (ref 3.5–5.3)

## 2013-06-30 MED ORDER — VARENICLINE TARTRATE 0.5 MG PO TABS: ORAL_TABLET | ORAL | Status: DC

## 2013-06-30 MED ORDER — VARENICLINE TARTRATE 1 MG PO TABS: 1.0000 mg | ORAL_TABLET | Freq: Two times a day (BID) | ORAL | Status: DC

## 2013-06-30 NOTE — Patient Instructions (Addendum)
Dear Mr. Seelman,   Thank you for coming to clinic today. Please read below regarding the issues that we discussed.   1. Previous surgery assessment: Once again the results of your blood tests back and EKG I will write a letter  To Dr. Charlann Boxer clearing you for surgery.   2. Smoking cessation: integrating a ready to quit smoking. This is the best thing you can do for yourself before surgery. Please start the Chantix at 0.5 mg daily for 3 days, then 1 mg daily for 4 days. Then after 1 week, take 1 mg twice a day. Call the office in one week at [phone] to let me know if you are doing ok. If you start to have strange moods or behavior, please stop the medication.   3. Testosterone: I will place an order in and have to get approval from the insurance company.  Please follow up in clinic in 1 month to see how the smoking cessation is going. Please call earlier if you have any questions or concerns.   Sincerely,   Dr. Clinton Sawyer

## 2013-06-30 NOTE — Progress Notes (Signed)
  Subjective:    Patient ID: Jeffrey [name], male    DOB: 06/28/1961, 52 y.o.   MRN: [id number]  HPI  52 year old male with chronic hip pain status post total hip replacement who is undergoing evaluation for repeat hip replacement.  1. Pre-Operative Evaluation: the patient is scheduled to have a right hip revision performed by Dr. Durene Romans on 08/14/2013, and he presents today for preoperative cardiac evaluation.   ABarbie Banner Cardiac Risk Index High Risk Surgery: moderate History of Ischemic Heart Disease: no  History of Heart Failure: no History of Cerebrovascular Disease: yes, CVA in 2004 from result of thrombus that passed through ASD, According to the patient Diabetes Mellitus Requiring Insulin: no Pre-operative Serum Creatinine > 2.0 mg/dL: no Total Score: 1  B. Functional Capacity: Chest Pain at Rest: no Climb 2 Flights of Stairs: yes, but does so slowly due to hip pain Run/Strenuous Activity: cannot run due to pain in hips  2. Smoking: 1 ppd, for at least 35 years, longest ever quit 5 days, tried Chantix in the past and felt like it helped to curb his craving, has tried Welbutrin  3. Testosterone  Deficiency: the patient has a long documented history of testosterone deficiency for which he was previously receiving injections of testosterone every 14 days by his previous physician. This has not been performed in over a year. Last time his testosterone level was checked it was 96. Patient feels that he is currently weak and fatigued, which are symptoms of his low testosterone. Therefore he would like to restart the injections. He denies any negative side effects to these injections and that he was taking them while undergoing his previous orthopedic surgeries.    <ECG>   Review of Systems See history of present illness    Objective:   Physical Exam BP 106/72  Pulse 87  Temp(Src) 98.6 F (37 C) (Oral)  Wt 201 lb (91.173 kg)  BMI 27.25 kg/m2 Gen: middle-aged  white male, well-appearing, pleasant and conversant Cardiovascular: regular rate and rhythm, no appreciable murmur on exam, no carotid bruits Lungs: clear to auscultation bilaterally Abdomen: soft nondistended nontender Skin: no rashes   Date: 06/30/13  Rate: 74  Rhythm: normal sinus rhythm  QRS Axis: normal  Intervals: PR prolonged  ST/T Wave abnormalities: normal  Conduction Disutrbances:first-degree A-V block   Narrative Interpretation: no evidence of infarct or dysrhythmia   Old EKG Reviewed: new 1st degree AV block     Assessment & Plan:

## 2013-07-02 DIAGNOSIS — Z0001 Encounter for general adult medical examination with abnormal findings: Secondary | ICD-10-CM | POA: Insufficient documentation

## 2013-07-02 DIAGNOSIS — Z0000 Encounter for general adult medical examination without abnormal findings: Secondary | ICD-10-CM | POA: Insufficient documentation

## 2013-07-02 MED ORDER — TESTOSTERONE CYPIONATE 200 MG/ML IM SOLN: 400.0000 mg | INTRAMUSCULAR | Status: DC

## 2013-07-02 NOTE — Assessment & Plan Note (Signed)
Assessment: patient with prolonged testosterone deficiency Plan: reorder testosterone cypionate

## 2013-07-02 NOTE — Assessment & Plan Note (Signed)
Assessment: Patient desires to quit smoking immediately and believes it Chantix will help, and I encouraged him to do so given his upcoming surgery. Plan: Patient given prescription for Chantix and explain the risk and benefits. The patient will call within one week to assure that he is not having any side effects of the medication.

## 2013-07-02 NOTE — Assessment & Plan Note (Signed)
Assessment: According to the revised cardiac risk index, the patient only has a 1% chance of cardiac complication for the orthopedic surgery; EKG and CBC and CMP have been evaluated; there are no complications of surgery Plan: Proceed with surgery according to Dr. Durene Romans

## 2013-07-20 ENCOUNTER — Telehealth: Payer: Self-pay | Admitting: Family Medicine

## 2013-07-20 NOTE — Telephone Encounter (Signed)
Informed. Jeffrey [name], Jeffrey [name]

## 2013-07-20 NOTE — Telephone Encounter (Signed)
Pt requesting results for labs.  It's been about 3 wks since last seen.  Please contact him asap with info

## 2013-07-20 NOTE — Telephone Encounter (Signed)
Called pt.

## 2013-07-20 NOTE — Telephone Encounter (Signed)
Please tell the patient that his labs were normal and that I already sent a letter to this surgeon clearing him for surgery the day after his visit.

## 2013-07-27 ENCOUNTER — Ambulatory Visit: Payer: Medicare Other | Admitting: Family Medicine

## 2013-08-07 ENCOUNTER — Encounter (HOSPITAL_COMMUNITY)
Admission: RE | Admit: 2013-08-07 | Discharge: 2013-08-07 | Disposition: A | Discharge status: Home / Self Care | Payer: Medicare Other | Source: Ambulatory Visit | Attending: Orthopedic Surgery | Admitting: Orthopedic Surgery

## 2013-08-07 ENCOUNTER — Ambulatory Visit (HOSPITAL_COMMUNITY)
Admission: RE | Admit: 2013-08-07 | Discharge: 2013-08-07 | Disposition: A | Discharge status: Home / Self Care | Payer: Medicare Other | Source: Ambulatory Visit | Attending: Orthopedic Surgery | Admitting: Orthopedic Surgery

## 2013-08-07 ENCOUNTER — Encounter (HOSPITAL_COMMUNITY): Payer: Self-pay | Admitting: Pharmacy Technician

## 2013-08-07 ENCOUNTER — Encounter (INDEPENDENT_AMBULATORY_CARE_PROVIDER_SITE_OTHER): Payer: Self-pay

## 2013-08-07 ENCOUNTER — Encounter (HOSPITAL_COMMUNITY): Payer: Self-pay

## 2013-08-07 DIAGNOSIS — Z96649 Presence of unspecified artificial hip joint: Secondary | ICD-10-CM | POA: Insufficient documentation

## 2013-08-07 DIAGNOSIS — Z01812 Encounter for preprocedural laboratory examination: Secondary | ICD-10-CM | POA: Insufficient documentation

## 2013-08-07 DIAGNOSIS — Z01818 Encounter for other preprocedural examination: Secondary | ICD-10-CM | POA: Insufficient documentation

## 2013-08-07 DIAGNOSIS — Y831 Surgical operation with implant of artificial internal device as the cause of abnormal reaction of the patient, or of later complication, without mention of misadventure at the time of the procedure: Secondary | ICD-10-CM | POA: Insufficient documentation

## 2013-08-07 DIAGNOSIS — T84099A Other mechanical complication of unspecified internal joint prosthesis, initial encounter: Secondary | ICD-10-CM | POA: Insufficient documentation

## 2013-08-07 HISTORY — DX: Adverse effect of unspecified anesthetic, initial encounter: T41.45XA

## 2013-08-07 HISTORY — DX: Unspecified osteoarthritis, unspecified site: M19.90

## 2013-08-07 HISTORY — DX: Peripheral vascular disease, unspecified: I73.9

## 2013-08-07 HISTORY — DX: Other complications of anesthesia, initial encounter: T88.59XA

## 2013-08-07 HISTORY — DX: Presence of other vascular implants and grafts: Z95.828

## 2013-08-07 LAB — BASIC METABOLIC PANEL
BUN: 24 mg/dL — ABNORMAL HIGH (ref 6–23)
CO2: 26 mEq/L (ref 19–32)
Calcium: 9.9 mg/dL (ref 8.4–10.5)
Chloride: 103 mEq/L (ref 96–112)
GFR calc non Af Amer: 84 mL/min — ABNORMAL LOW (ref >90)
Glucose, Bld: 106 mg/dL — ABNORMAL HIGH (ref 70–99)
Sodium: 137 mEq/L (ref 135–145)

## 2013-08-07 LAB — CBC
HCT: 36.1 % — ABNORMAL LOW (ref 39.0–52.0)
MCH: 33.7 pg (ref 26.0–34.0)
MCV: 96.5 fL (ref 78.0–100.0)
Platelets: 219 10*3/uL (ref 150–400)
RBC: 3.74 MIL/uL — ABNORMAL LOW (ref 4.22–5.81)
RDW: 12.7 % (ref 11.5–15.5)
WBC: 6.2 10*3/uL (ref 4.0–10.5)

## 2013-08-07 LAB — URINALYSIS, ROUTINE W REFLEX MICROSCOPIC
Glucose, UA: NEGATIVE mg/dL
Leukocytes, UA: NEGATIVE
Protein, ur: NEGATIVE mg/dL
Specific Gravity, Urine: 1.024 (ref 1.005–1.030)
pH: 6 (ref 5.0–8.0)

## 2013-08-07 LAB — SURGICAL PCR SCREEN
MRSA, PCR: NEGATIVE
Staphylococcus aureus: NEGATIVE

## 2013-08-07 LAB — APTT: aPTT: 26 seconds (ref 24–37)

## 2013-08-07 NOTE — Patient Instructions (Addendum)
20 Jeffrey [name]  08/07/2013   Your procedure is scheduled on:  08/14/13   Monday  Report to [facility] at 0500      AM.  Call this number if you have problems the morning of surgery: [phone]       Remember:   Do not eat food  Or drink :After Midnight. Sunday NIGHT   Take these medicines the morning of surgery with A SIP OF WATER: Amlodipine, Wellbutrin,Celexa, Doxazosin, Hydralazine, Pantoprazole                       May take Neurontin if needed  FENTANYL PATCH WILL HAVE TO BE REMOVED PRIOR TO HOSPITAL ARRIVAL .  Contacts, dentures or partial plates can not be worn to surgery  Leave suitcase in the car. After surgery it may be brought to your room.  For patients admitted to the hospital, checkout time is 11:00 AM day of  discharge.             SPECIAL INSTRUCTIONS- SEE [place] PREPARING FOR SURGERY INSTRUCTION SHEET-     DO NOT WEAR JEWELRY, LOTIONS, POWDERS, OR PERFUMES.  WOMEN-- DO NOT SHAVE LEGS OR UNDERARMS FOR 12 HOURS BEFORE SHOWERS. MEN MAY SHAVE FACE.  Patients discharged the day of surgery will not be allowed to drive home. IF going home the day of surgery, you must have a driver and someone to stay with you for the first 24 hours  Name and phone number of your driver:   admission                                                                     Please read over the following fact sheets that you were given: MRSA Information, Incentive Spirometry Sheet, Blood Transfusion Sheet  Information                                                                                   [name]  PST 336  [phone]                 FAILURE TO FOLLOW THESE INSTRUCTIONS MAY RESULT IN  CANCELLATION   OF YOUR SURGERY                                                  Patient Signature _____________________________                       11 /4/14 1020am- PATIENT INSTRUCTED DO NOT TAKE DOXASIN MORNING OF SURGERY-  STATES ISNT ON AM DOSE NOW, even though verified this dosage with  this RN 08/07/13

## 2013-08-07 NOTE — Progress Notes (Signed)
EKG 9/14 EPIC, clearance Dr Clinton Sawyer  chart

## 2013-08-07 NOTE — Progress Notes (Signed)
08/07/13 1338  OBSTRUCTIVE SLEEP APNEA  Have you ever been diagnosed with sleep apnea through a sleep study? No  Do you snore loudly (loud enough to be heard through closed doors)?  1  Do you often feel tired, fatigued, or sleepy during the daytime? 1  Has anyone observed you stop breathing during your sleep? 0  Do you have, or are you being treated for high blood pressure? 1  BMI more than 35 kg/m2? 0  Age over 52 years old? 1  Neck circumference greater than 40 cm/18 inches? 0  Gender: 1  Obstructive Sleep Apnea Score 5  Score 4 or greater  Results sent to PCP

## 2013-08-08 NOTE — Progress Notes (Signed)
Spoke with Mr Caggiano today to verify his Doxasin instructions- had confirmed at PST appt that he takes 4mg  at hs, and 2 mg in AM. Mr Tumolo stated today he does not have any more 2 mg tabs- just the HS dosage.  Informed him that he would not take that am dose as instructed yesterday

## 2013-08-09 ENCOUNTER — Other Ambulatory Visit: Payer: Self-pay | Admitting: Family Medicine

## 2013-08-09 MED ORDER — PANTOPRAZOLE SODIUM 40 MG PO TBEC: 40.0000 mg | DELAYED_RELEASE_TABLET | Freq: Every day | ORAL | Status: DC

## 2013-08-09 MED ORDER — CARVEDILOL 25 MG PO TABS: 25.0000 mg | ORAL_TABLET | Freq: Two times a day (BID) | ORAL | Status: DC

## 2013-08-09 NOTE — H&P (Signed)
TOTAL HIP ADMISSION H&P  Patient is admitted for right total hip revision arthroplasty.  Subjective:  Chief Complaint: Right hip pain previous metal-on-metal hip arthroplasty  HPI: Jeffrey [name], 52 y.o. male, has a history of pain and functional disability in the right hip(s) due toface wear of previous prosthetic joint replacement.  Onset of symptoms was gradual starting 2 years ago with gradually worsening course since that time.The patient noted prior procedures of the hip to include arthroplasty on the right hip(s).  Patient currently rates pain in the right hip at 10 out of 10 with activity. Patient has night pain, worsening of pain with activity and weight bearing, trendelenberg gait, pain that interfers with activities of daily living and pain with passive range of motion. Patient has evidence of previous metal-on-metal total hip arthroplasty by imaging studies. This condition presents safety issues increasing the risk of falls.   There is no current active infection.  Labs work reveals both elevevated Cobalt and Chromium levels.  Risks, benefits and expectations were discussed with the patient. Patient understand the risks, benefits and expectations and wishes to proceed with surgery.   D/C Plans:   Home with HHPT/SNF  Post-op Meds:     No Rx given   Tranexamic Acid:   Not to be given - previous stroke  Decadron:    To be given   FYI:    Xarelto post-op and then ASA     Patient Active Problem List   Diagnosis Date Noted  . Preoperative evaluation to rule out surgical contraindication 07/02/2013  . History of total hip arthroplasty 01/29/2013  . Left ankle pain 05/24/2012  . CVA (cerebral infarction) 04/11/2012  . Pain in joint, pelvic region and thigh 03/02/2012  . Nephrolithiasis 10/30/2011  . Septic hip 12/28/2010  . Other chronic pain 04/02/2010  . GERD 04/02/2010  . Chronic kidney disease (CKD), stage II (mild) 07/22/2009  . BENIGN PROSTATIC HYPERTROPHY, WITH  OBSTRUCTION 07/19/2009  . INSOMNIA 06/20/2009  . HYPERTRIGLYCERIDEMIA 05/22/2008  . DEPRESSIVE DISORDER NOT ELSEWHERE CLASSIFIED 11/18/2007  . ATRIAL SEPTAL DEFECT 11/18/2007  . TESTOSTERONE DEFICIENCY 09/12/2007  . TOBACCO ABUSE 01/20/2007  . ESSENTIAL HYPERTENSION 01/20/2007  . BACK PAIN, CHRONIC 01/20/2007  . AVASCULAR NECROSIS, FEMORAL HEAD 01/20/2007   Past Medical History  Diagnosis Date  . Hypertension   . Depression   . GERD (gastroesophageal reflux disease)   . Heart murmur     has hole in his heart  . Sleep apnea     undiagnosed  . Kidney calculus   . Closed fracture of lateral malleolus 01/04/2008    Qualifier: History of  By: Sharen Hones  MD, Wynona Canes    . Complication of anesthesia     "problems with my breathing- states was told had sleep apnea, but not ever tested"  . Stroke 2004    patient has residual numbness  rt arm  . Arthritis   . Peripheral vascular disease     DVT 2004-  left leg  . Presence of IVC filter     right femoral    Past Surgical History  Procedure Laterality Date  . Total hip arthroplasty Bilateral     left x 2; right x 1  . Trochanteric bursa injection Left 03/01/13    Preferred Pain Management, Dr. Ardell Isaacs  . Joint replacement    . Ivc filter placement Right     No prescriptions prior to admission   Allergies  Allergen Reactions  . Ace Inhibitors  Possible ACEi induced renal failure.   [name] Hctz [Hydrochlorothiazide]     Thiazide induced hypercalcemia   . Lipitor [Atorvastatin] Nausea And Vomiting  . Penicillins     REACTION: hives    History  Substance Use Topics  . Smoking status: Former Smoker -- 0.50 packs/day for 16 years    Types: Cigarettes    Quit date: 07/16/2013  . Smokeless tobacco: Never Used  . Alcohol Use: No    No family history on file.   Review of Systems  Constitutional: Positive for malaise/fatigue.  Eyes: Negative.   Respiratory: Positive for shortness of breath (on exertion).   Cardiovascular:  Negative.   Gastrointestinal: Positive for heartburn.  Genitourinary: Positive for frequency.  Musculoskeletal: Positive for back pain, joint pain and myalgias.  Skin: Negative.   Neurological: Positive for weakness and headaches.  Endo/Heme/Allergies: Negative.   Psychiatric/Behavioral: Positive for depression. The patient is nervous/anxious and has insomnia.     Objective:  Physical Exam  Constitutional: He is oriented to person, place, and time. He appears well-developed and well-nourished.  HENT:  Head: Normocephalic.  Neck: Neck supple. No JVD present. No tracheal deviation present. No thyromegaly present.  Cardiovascular: Normal rate, regular rhythm, normal heart sounds and intact distal pulses.   Respiratory: Effort normal and breath sounds normal. No stridor. No respiratory distress. He has no wheezes.  GI: Soft. There is no tenderness. There is no guarding.  Musculoskeletal:       Right hip: He exhibits decreased range of motion, decreased strength, tenderness, bony tenderness and laceration (healed from previous surgery). He exhibits no swelling, no crepitus and no deformity.  Lymphadenopathy:    He has no cervical adenopathy.  Neurological: He is alert and oriented to person, place, and time.  Skin: Skin is warm and dry.  Psychiatric: He has a normal mood and affect.     Labs:  Estimated body mass index is 27.25 kg/(m^2) as calculated from the following:   Height as of 01/23/13: 6' (1.829 m).   Weight as of 06/30/13: 91.173 kg (201 lb).   Imaging Review Plain radiographs demonstrate previous metal-on-metal total hip arthroplasty of the right hip(s). The bone quality appears to be good for age and reported activity level.  Assessment/Plan:  Failed total hip arthroplasty, right  The patient history, physical examination, clinical judgement of the provider and imaging studies are consistent with metal-on-metal total hip arthroplasty of the right hip(s) and total hip  revision arthroplasty is deemed medically necessary. The treatment options including medical management, injection therapy, arthroscopy and arthroplasty were discussed at length. The risks and benefits of total hip arthroplasty were presented and reviewed. The risks due to aseptic loosening, infection, stiffness, dislocation/subluxation,  thromboembolic complications and other imponderables were discussed.  The patient acknowledged the explanation, agreed to proceed with the plan and consent was signed. Patient is being admitted for inpatient treatment for surgery, pain control, PT, OT, prophylactic antibiotics, VTE prophylaxis, progressive ambulation and ADL's and discharge planning.The patient is planning to be discharged home with home health services.    Anastasio Auerbach Lile Mccurley   PAC  08/09/2013, 6:20 PM

## 2013-08-14 ENCOUNTER — Inpatient Hospital Stay (HOSPITAL_COMMUNITY): Payer: Medicare Other | Admitting: Anesthesiology

## 2013-08-14 ENCOUNTER — Inpatient Hospital Stay (HOSPITAL_COMMUNITY): Payer: Medicare Other

## 2013-08-14 ENCOUNTER — Encounter (HOSPITAL_COMMUNITY): Payer: Medicare Other | Admitting: Anesthesiology

## 2013-08-14 ENCOUNTER — Encounter (HOSPITAL_COMMUNITY): Payer: Self-pay | Admitting: *Deleted

## 2013-08-14 ENCOUNTER — Inpatient Hospital Stay (HOSPITAL_COMMUNITY)
Admission: RE | Admit: 2013-08-14 | Discharge: 2013-08-15 | DRG: 467 | Disposition: A | Discharge status: Home Health | Payer: Medicare Other | Source: Ambulatory Visit | Attending: Orthopedic Surgery | Admitting: Orthopedic Surgery

## 2013-08-14 ENCOUNTER — Encounter (HOSPITAL_COMMUNITY)
Admission: RE | Disposition: A | Discharge status: Home Health | Payer: Self-pay | Source: Ambulatory Visit | Attending: Orthopedic Surgery

## 2013-08-14 DIAGNOSIS — I129 Hypertensive chronic kidney disease with stage 1 through stage 4 chronic kidney disease, or unspecified chronic kidney disease: Secondary | ICD-10-CM | POA: Diagnosis present

## 2013-08-14 DIAGNOSIS — Y831 Surgical operation with implant of artificial internal device as the cause of abnormal reaction of the patient, or of later complication, without mention of misadventure at the time of the procedure: Secondary | ICD-10-CM | POA: Diagnosis present

## 2013-08-14 DIAGNOSIS — Z96649 Presence of unspecified artificial hip joint: Secondary | ICD-10-CM

## 2013-08-14 DIAGNOSIS — Z87442 Personal history of urinary calculi: Secondary | ICD-10-CM

## 2013-08-14 DIAGNOSIS — Z86718 Personal history of other venous thrombosis and embolism: Secondary | ICD-10-CM

## 2013-08-14 DIAGNOSIS — T84099A Other mechanical complication of unspecified internal joint prosthesis, initial encounter: Principal | ICD-10-CM | POA: Diagnosis present

## 2013-08-14 DIAGNOSIS — D62 Acute posthemorrhagic anemia: Secondary | ICD-10-CM | POA: Diagnosis not present

## 2013-08-14 DIAGNOSIS — K219 Gastro-esophageal reflux disease without esophagitis: Secondary | ICD-10-CM | POA: Diagnosis present

## 2013-08-14 DIAGNOSIS — E669 Obesity, unspecified: Secondary | ICD-10-CM | POA: Diagnosis present

## 2013-08-14 DIAGNOSIS — N182 Chronic kidney disease, stage 2 (mild): Secondary | ICD-10-CM | POA: Diagnosis present

## 2013-08-14 DIAGNOSIS — N400 Enlarged prostate without lower urinary tract symptoms: Secondary | ICD-10-CM | POA: Diagnosis present

## 2013-08-14 DIAGNOSIS — G4730 Sleep apnea, unspecified: Secondary | ICD-10-CM | POA: Diagnosis present

## 2013-08-14 DIAGNOSIS — Z8673 Personal history of transient ischemic attack (TIA), and cerebral infarction without residual deficits: Secondary | ICD-10-CM

## 2013-08-14 DIAGNOSIS — Z87891 Personal history of nicotine dependence: Secondary | ICD-10-CM

## 2013-08-14 DIAGNOSIS — D500 Iron deficiency anemia secondary to blood loss (chronic): Secondary | ICD-10-CM | POA: Diagnosis not present

## 2013-08-14 DIAGNOSIS — Z6830 Body mass index (BMI) 30.0-30.9, adult: Secondary | ICD-10-CM

## 2013-08-14 DIAGNOSIS — R7989 Other specified abnormal findings of blood chemistry: Secondary | ICD-10-CM | POA: Diagnosis present

## 2013-08-14 HISTORY — PX: TOTAL HIP REVISION: SHX763

## 2013-08-14 LAB — TYPE AND SCREEN
ABO/RH(D): O POS
Antibody Screen: NEGATIVE

## 2013-08-14 SURGERY — TOTAL HIP REVISION
Anesthesia: General | Site: Hip | Laterality: Right | Wound class: Clean

## 2013-08-14 MED ORDER — GABAPENTIN 300 MG PO CAPS
900.0000 mg | ORAL_CAPSULE | Freq: Every evening | ORAL | Status: DC | PRN
  Filled 2013-08-14: qty 3

## 2013-08-14 MED ORDER — URIBEL 118 MG PO CAPS: 118.0000 mg | ORAL_CAPSULE | Freq: Four times a day (QID) | ORAL | Status: DC | PRN

## 2013-08-14 MED ORDER — DOCUSATE SODIUM 100 MG PO CAPS
100.0000 mg | ORAL_CAPSULE | Freq: Two times a day (BID) | ORAL | Status: DC
  Administered 2013-08-14 – 2013-08-15 (×2): 100 mg via ORAL

## 2013-08-14 MED ORDER — SODIUM CHLORIDE 0.9 % IV SOLN
100.0000 mL/h | INTRAVENOUS | Status: DC
  Administered 2013-08-14 (×2): 100 mL/h via INTRAVENOUS
  Filled 2013-08-14 (×10): qty 1000

## 2013-08-14 MED ORDER — EPHEDRINE SULFATE 50 MG/ML IJ SOLN
INTRAMUSCULAR | Status: DC | PRN
  Administered 2013-08-14: 5 mg via INTRAVENOUS
  Administered 2013-08-14 (×2): 10 mg via INTRAVENOUS

## 2013-08-14 MED ORDER — PANTOPRAZOLE SODIUM 40 MG PO TBEC
40.0000 mg | DELAYED_RELEASE_TABLET | Freq: Every day | ORAL | Status: DC
  Administered 2013-08-14 – 2013-08-15 (×2): 40 mg via ORAL
  Filled 2013-08-14 (×2): qty 1

## 2013-08-14 MED ORDER — HYDROMORPHONE HCL PF 1 MG/ML IJ SOLN
INTRAMUSCULAR | Status: AC
  Filled 2013-08-14: qty 1

## 2013-08-14 MED ORDER — DIPHENHYDRAMINE HCL 25 MG PO CAPS: 25.0000 mg | ORAL_CAPSULE | Freq: Four times a day (QID) | ORAL | Status: DC | PRN

## 2013-08-14 MED ORDER — MENTHOL 3 MG MT LOZG: 1.0000 | LOZENGE | OROMUCOSAL | Status: DC | PRN

## 2013-08-14 MED ORDER — LACTATED RINGERS IV SOLN
INTRAVENOUS | Status: DC
  Administered 2013-08-14: 10:00:00 via INTRAVENOUS

## 2013-08-14 MED ORDER — ROCURONIUM BROMIDE 100 MG/10ML IV SOLN
INTRAVENOUS | Status: DC | PRN
  Administered 2013-08-14: 50 mg via INTRAVENOUS

## 2013-08-14 MED ORDER — BISACODYL 10 MG RE SUPP: 10.0000 mg | Freq: Every day | RECTAL | Status: DC | PRN

## 2013-08-14 MED ORDER — METOCLOPRAMIDE HCL 10 MG PO TABS: 5.0000 mg | ORAL_TABLET | Freq: Three times a day (TID) | ORAL | Status: DC | PRN

## 2013-08-14 MED ORDER — PROPOFOL 10 MG/ML IV BOLUS
INTRAVENOUS | Status: DC | PRN
  Administered 2013-08-14: 200 mg via INTRAVENOUS

## 2013-08-14 MED ORDER — CITALOPRAM HYDROBROMIDE 20 MG PO TABS
20.0000 mg | ORAL_TABLET | Freq: Two times a day (BID) | ORAL | Status: DC
  Administered 2013-08-14 – 2013-08-15 (×2): 20 mg via ORAL
  Filled 2013-08-14 (×3): qty 1

## 2013-08-14 MED ORDER — STERILE WATER FOR IRRIGATION IR SOLN
Status: DC | PRN
  Administered 2013-08-14: 1500 mL

## 2013-08-14 MED ORDER — PHENOL 1.4 % MT LIQD: 1.0000 | OROMUCOSAL | Status: DC | PRN

## 2013-08-14 MED ORDER — HYDROMORPHONE HCL PF 1 MG/ML IJ SOLN
INTRAMUSCULAR | Status: DC | PRN
  Administered 2013-08-14 (×2): 2 mg via INTRAVENOUS

## 2013-08-14 MED ORDER — AMLODIPINE BESYLATE 10 MG PO TABS
10.0000 mg | ORAL_TABLET | Freq: Every day | ORAL | Status: DC
  Administered 2013-08-14 – 2013-08-15 (×2): 10 mg via ORAL
  Filled 2013-08-14 (×2): qty 1

## 2013-08-14 MED ORDER — GABAPENTIN 300 MG PO CAPS
600.0000 mg | ORAL_CAPSULE | Freq: Every day | ORAL | Status: DC | PRN
  Filled 2013-08-14: qty 2

## 2013-08-14 MED ORDER — BUPROPION HCL ER (SR) 150 MG PO TB12
150.0000 mg | ORAL_TABLET | Freq: Two times a day (BID) | ORAL | Status: DC
  Administered 2013-08-14 – 2013-08-15 (×2): 150 mg via ORAL
  Filled 2013-08-14 (×3): qty 1

## 2013-08-14 MED ORDER — LIDOCAINE HCL (CARDIAC) 20 MG/ML IV SOLN
INTRAVENOUS | Status: DC | PRN
  Administered 2013-08-14: 100 mg via INTRAVENOUS

## 2013-08-14 MED ORDER — FENTANYL CITRATE 0.05 MG/ML IJ SOLN
INTRAMUSCULAR | Status: DC | PRN
  Administered 2013-08-14 (×3): 50 ug via INTRAVENOUS
  Administered 2013-08-14: 100 ug via INTRAVENOUS

## 2013-08-14 MED ORDER — HYDROMORPHONE HCL PF 1 MG/ML IJ SOLN: 0.2500 mg | INTRAMUSCULAR | Status: DC | PRN

## 2013-08-14 MED ORDER — NAPHAZOLINE HCL 0.1 % OP SOLN
2.0000 [drp] | Freq: Every day | OPHTHALMIC | Status: DC
  Administered 2013-08-15: 2 [drp] via OPHTHALMIC
  Filled 2013-08-14: qty 15

## 2013-08-14 MED ORDER — VARENICLINE TARTRATE 1 MG PO TABS
1.0000 mg | ORAL_TABLET | Freq: Two times a day (BID) | ORAL | Status: DC
Start: 2013-08-14 — End: 2013-08-15
  Administered 2013-08-14 – 2013-08-15 (×2): 1 mg via ORAL
  Filled 2013-08-14 (×3): qty 1

## 2013-08-14 MED ORDER — METOCLOPRAMIDE HCL 5 MG/ML IJ SOLN: 5.0000 mg | Freq: Three times a day (TID) | INTRAMUSCULAR | Status: DC | PRN

## 2013-08-14 MED ORDER — ZOLPIDEM TARTRATE 5 MG PO TABS: 5.0000 mg | ORAL_TABLET | Freq: Every evening | ORAL | Status: DC | PRN

## 2013-08-14 MED ORDER — NEOSTIGMINE METHYLSULFATE 1 MG/ML IJ SOLN
INTRAMUSCULAR | Status: DC | PRN
  Administered 2013-08-14: 3 mg via INTRAVENOUS

## 2013-08-14 MED ORDER — CLINDAMYCIN PHOSPHATE 600 MG/50ML IV SOLN
600.0000 mg | Freq: Four times a day (QID) | INTRAVENOUS | Status: AC
  Administered 2013-08-14 (×2): 600 mg via INTRAVENOUS
  Filled 2013-08-14 (×2): qty 50

## 2013-08-14 MED ORDER — ONDANSETRON HCL 4 MG/2ML IJ SOLN
INTRAMUSCULAR | Status: DC | PRN
  Administered 2013-08-14: 4 mg via INTRAVENOUS

## 2013-08-14 MED ORDER — ALUM & MAG HYDROXIDE-SIMETH 200-200-20 MG/5ML PO SUSP: 30.0000 mL | ORAL | Status: DC | PRN

## 2013-08-14 MED ORDER — LACTATED RINGERS IV SOLN
INTRAVENOUS | Status: DC | PRN
  Administered 2013-08-14 (×2): via INTRAVENOUS

## 2013-08-14 MED ORDER — METHOCARBAMOL 500 MG PO TABS
500.0000 mg | ORAL_TABLET | Freq: Four times a day (QID) | ORAL | Status: DC | PRN
Start: 2013-08-14 — End: 2013-08-15
  Administered 2013-08-14 – 2013-08-15 (×2): 500 mg via ORAL
  Filled 2013-08-14 (×2): qty 1

## 2013-08-14 MED ORDER — POLYETHYLENE GLYCOL 3350 17 G PO PACK
17.0000 g | PACK | Freq: Two times a day (BID) | ORAL | Status: DC
  Administered 2013-08-15: 17 g via ORAL

## 2013-08-14 MED ORDER — PHENYLEPHRINE HCL 10 MG/ML IJ SOLN
INTRAMUSCULAR | Status: DC | PRN
  Administered 2013-08-14 (×4): 80 ug via INTRAVENOUS

## 2013-08-14 MED ORDER — GABAPENTIN 300 MG PO CAPS: 600.0000 mg | ORAL_CAPSULE | Freq: Two times a day (BID) | ORAL | Status: DC | PRN

## 2013-08-14 MED ORDER — METHOCARBAMOL 100 MG/ML IJ SOLN
500.0000 mg | Freq: Four times a day (QID) | INTRAVENOUS | Status: DC | PRN
  Administered 2013-08-14: 500 mg via INTRAVENOUS
  Filled 2013-08-14: qty 5

## 2013-08-14 MED ORDER — ONDANSETRON HCL 4 MG PO TABS: 4.0000 mg | ORAL_TABLET | Freq: Four times a day (QID) | ORAL | Status: DC | PRN

## 2013-08-14 MED ORDER — CLINDAMYCIN PHOSPHATE 900 MG/50ML IV SOLN
900.0000 mg | INTRAVENOUS | Status: AC
  Administered 2013-08-14: 900 mg via INTRAVENOUS
  Filled 2013-08-14: qty 50

## 2013-08-14 MED ORDER — GLYCOPYRROLATE 0.2 MG/ML IJ SOLN
INTRAMUSCULAR | Status: DC | PRN
  Administered 2013-08-14: 0.4 mg via INTRAVENOUS

## 2013-08-14 MED ORDER — URELLE 81 MG PO TABS
1.0000 | ORAL_TABLET | Freq: Four times a day (QID) | ORAL | Status: DC | PRN
  Filled 2013-08-14: qty 1

## 2013-08-14 MED ORDER — RIVAROXABAN 10 MG PO TABS
10.0000 mg | ORAL_TABLET | Freq: Every day | ORAL | Status: DC
  Administered 2013-08-15: 10 mg via ORAL
  Filled 2013-08-14 (×2): qty 1

## 2013-08-14 MED ORDER — DEXAMETHASONE SODIUM PHOSPHATE 4 MG/ML IJ SOLN
INTRAMUSCULAR | Status: DC | PRN
  Administered 2013-08-14: 10 mg via INTRAVENOUS

## 2013-08-14 MED ORDER — DEXAMETHASONE SODIUM PHOSPHATE 10 MG/ML IJ SOLN: 10.0000 mg | Freq: Once | INTRAMUSCULAR | Status: DC

## 2013-08-14 MED ORDER — NAPHAZOLINE HCL 0.012 % OP SOLN: 2.0000 [drp] | Freq: Every day | OPHTHALMIC | Status: DC

## 2013-08-14 MED ORDER — FLEET ENEMA 7-19 GM/118ML RE ENEM: 1.0000 | ENEMA | Freq: Once | RECTAL | Status: AC | PRN

## 2013-08-14 MED ORDER — ONDANSETRON HCL 4 MG/2ML IJ SOLN: 4.0000 mg | Freq: Four times a day (QID) | INTRAMUSCULAR | Status: DC | PRN

## 2013-08-14 MED ORDER — FERROUS SULFATE 325 (65 FE) MG PO TABS
325.0000 mg | ORAL_TABLET | Freq: Three times a day (TID) | ORAL | Status: DC
  Administered 2013-08-14 – 2013-08-15 (×3): 325 mg via ORAL
  Filled 2013-08-14 (×5): qty 1

## 2013-08-14 MED ORDER — DEXAMETHASONE SODIUM PHOSPHATE 10 MG/ML IJ SOLN
10.0000 mg | Freq: Once | INTRAMUSCULAR | Status: AC
  Administered 2013-08-15: 10 mg via INTRAVENOUS
  Filled 2013-08-14: qty 1

## 2013-08-14 MED ORDER — HYDRALAZINE HCL 25 MG PO TABS
25.0000 mg | ORAL_TABLET | Freq: Three times a day (TID) | ORAL | Status: DC
  Administered 2013-08-14 – 2013-08-15 (×2): 25 mg via ORAL
  Filled 2013-08-14 (×5): qty 1

## 2013-08-14 MED ORDER — CARVEDILOL 25 MG PO TABS
25.0000 mg | ORAL_TABLET | Freq: Two times a day (BID) | ORAL | Status: DC
  Administered 2013-08-14 – 2013-08-15 (×2): 25 mg via ORAL
  Filled 2013-08-14 (×4): qty 1

## 2013-08-14 MED ORDER — CARVEDILOL 25 MG PO TABS
25.0000 mg | ORAL_TABLET | Freq: Two times a day (BID) | ORAL | Status: DC
  Administered 2013-08-14: 25 mg via ORAL
  Filled 2013-08-14: qty 1

## 2013-08-14 MED ORDER — DOXAZOSIN MESYLATE 4 MG PO TABS
4.0000 mg | ORAL_TABLET | Freq: Every day | ORAL | Status: DC
Start: 2013-08-14 — End: 2013-08-15
  Administered 2013-08-14: 4 mg via ORAL
  Filled 2013-08-14 (×2): qty 1

## 2013-08-14 MED ORDER — MIDAZOLAM HCL 5 MG/5ML IJ SOLN
INTRAMUSCULAR | Status: DC | PRN
  Administered 2013-08-14: 2 mg via INTRAVENOUS

## 2013-08-14 MED ORDER — HYDROMORPHONE HCL PF 1 MG/ML IJ SOLN
0.5000 mg | INTRAMUSCULAR | Status: DC | PRN
  Administered 2013-08-14: 0.5 mg via INTRAVENOUS
  Filled 2013-08-14: qty 1

## 2013-08-14 MED ORDER — HYDROCODONE-ACETAMINOPHEN 7.5-325 MG PO TABS
1.0000 | ORAL_TABLET | ORAL | Status: DC
  Administered 2013-08-14 (×3): 2 via ORAL
  Administered 2013-08-15: 1 via ORAL
  Administered 2013-08-15 (×3): 2 via ORAL
  Filled 2013-08-14 (×7): qty 2

## 2013-08-14 MED ORDER — 0.9 % SODIUM CHLORIDE (POUR BTL) OPTIME
TOPICAL | Status: DC | PRN
  Administered 2013-08-14 (×2): 1000 mL

## 2013-08-14 SURGICAL SUPPLY — 62 items
ADH SKN CLS APL DERMABOND .7 (GAUZE/BANDAGES/DRESSINGS) ×1
BAG SPEC THK2 15X12 ZIP CLS (MISCELLANEOUS) ×1
BAG ZIPLOCK 12X15 (MISCELLANEOUS) ×2 IMPLANT
BLADE SAW SGTL 18X1.27X75 (BLADE) ×2 IMPLANT
BRUSH FEMORAL CANAL (MISCELLANEOUS) IMPLANT
CLOTH BEACON ORANGE TIMEOUT ST (SAFETY) ×2 IMPLANT
CUP ACET PNNCL SECTR W/GRIP 56 (Hips) IMPLANT
DERMABOND ADVANCED (GAUZE/BANDAGES/DRESSINGS) ×1
DERMABOND ADVANCED .7 DNX12 (GAUZE/BANDAGES/DRESSINGS) ×1 IMPLANT
DRAPE INCISE IOBAN 85X60 (DRAPES) ×2 IMPLANT
DRAPE ORTHO SPLIT 77X108 STRL (DRAPES) ×4
DRAPE POUCH INSTRU U-SHP 10X18 (DRAPES) ×2 IMPLANT
DRAPE SURG 17X11 SM STRL (DRAPES) ×2 IMPLANT
DRAPE SURG ORHT 6 SPLT 77X108 (DRAPES) ×2 IMPLANT
DRAPE U-SHAPE 47X51 STRL (DRAPES) ×2 IMPLANT
DRSG AQUACEL AG ADV 3.5X10 (GAUZE/BANDAGES/DRESSINGS) IMPLANT
DRSG AQUACEL AG ADV 3.5X14 (GAUZE/BANDAGES/DRESSINGS) ×2 IMPLANT
DRSG EMULSION OIL 3X16 NADH (GAUZE/BANDAGES/DRESSINGS) ×2 IMPLANT
DRSG MEPILEX BORDER 4X4 (GAUZE/BANDAGES/DRESSINGS) ×2 IMPLANT
DRSG MEPILEX BORDER 4X8 (GAUZE/BANDAGES/DRESSINGS) ×2 IMPLANT
DRSG TEGADERM 4X4.75 (GAUZE/BANDAGES/DRESSINGS) ×2 IMPLANT
DURAPREP 26ML APPLICATOR (WOUND CARE) ×2 IMPLANT
ELECT BLADE TIP CTD 4 INCH (ELECTRODE) ×2 IMPLANT
ELECT REM PT RETURN 9FT ADLT (ELECTROSURGICAL) ×2
ELECTRODE REM PT RTRN 9FT ADLT (ELECTROSURGICAL) ×1 IMPLANT
ELIMINATOR HOLE APEX DEPUY (Hips) ×1 IMPLANT
EVACUATOR 1/8 PVC DRAIN (DRAIN) ×2 IMPLANT
FACESHIELD LNG OPTICON STERILE (SAFETY) ×8 IMPLANT
GAUZE SPONGE 2X2 8PLY STRL LF (GAUZE/BANDAGES/DRESSINGS) ×1 IMPLANT
GLOVE BIOGEL PI IND STRL 7.5 (GLOVE) ×1 IMPLANT
GLOVE BIOGEL PI IND STRL 8 (GLOVE) ×1 IMPLANT
GLOVE BIOGEL PI INDICATOR 7.5 (GLOVE) ×1
GLOVE BIOGEL PI INDICATOR 8 (GLOVE) ×1
GLOVE ECLIPSE 8.0 STRL XLNG CF (GLOVE) ×4 IMPLANT
GOWN BRE IMP PREV XXLGXLNG (GOWN DISPOSABLE) ×4 IMPLANT
GOWN PREVENTION PLUS LG XLONG (DISPOSABLE) ×2 IMPLANT
HANDPIECE INTERPULSE COAX TIP (DISPOSABLE)
HEAD CERAMIC 36 PLUS 8.5 12 14 (Hips) ×1 IMPLANT
KIT BASIN OR (CUSTOM PROCEDURE TRAY) ×2 IMPLANT
LINER NEUTRAL 52MMX36MMX56 (Hips) ×1 IMPLANT
MANIFOLD NEPTUNE II (INSTRUMENTS) ×2 IMPLANT
NS IRRIG 1000ML POUR BTL (IV SOLUTION) ×3 IMPLANT
PACK TOTAL JOINT (CUSTOM PROCEDURE TRAY) ×2 IMPLANT
PINN SECTOR W/GRIP ACE CUP 56 (Hips) ×2 IMPLANT
POSITIONER SURGICAL ARM (MISCELLANEOUS) ×2 IMPLANT
PRESSURIZER FEMORAL UNIV (MISCELLANEOUS) IMPLANT
SCREW 6.5MMX40MM (Screw) ×2 IMPLANT
SET HNDPC FAN SPRY TIP SCT (DISPOSABLE) IMPLANT
SPONGE GAUZE 2X2 STER 10/PKG (GAUZE/BANDAGES/DRESSINGS) ×1
SPONGE LAP 18X18 X RAY DECT (DISPOSABLE) ×2 IMPLANT
SPONGE LAP 4X18 X RAY DECT (DISPOSABLE) ×2 IMPLANT
STAPLER VISISTAT 35W (STAPLE) ×2 IMPLANT
SUCTION FRAZIER TIP 10 FR DISP (SUCTIONS) ×2 IMPLANT
SUT MNCRL AB 3-0 PS2 18 (SUTURE) ×1 IMPLANT
SUT VIC AB 1 CT1 36 (SUTURE) ×6 IMPLANT
SUT VIC AB 2-0 CT1 27 (SUTURE) ×2
SUT VIC AB 2-0 CT1 TAPERPNT 27 (SUTURE) ×3 IMPLANT
SUT VLOC 180 0 24IN GS25 (SUTURE) ×3 IMPLANT
TOWEL OR 17X26 10 PK STRL BLUE (TOWEL DISPOSABLE) ×4 IMPLANT
TOWER CARTRIDGE SMART MIX (DISPOSABLE) IMPLANT
TRAY FOLEY CATH 14FRSI W/METER (CATHETERS) ×2 IMPLANT
WATER STERILE IRR 1500ML POUR (IV SOLUTION) ×2 IMPLANT

## 2013-08-14 NOTE — Evaluation (Signed)
Physical Therapy Evaluation Patient Details Name: Jeffrey [name] MRN: [id number] DOB: Jul 10, 1961 Today's Date: 08/14/2013 Time: 4098-1191 PT Time Calculation (min): 24 min  PT Assessment / Plan / Recommendation History of Present Illness     Clinical Impression  Pt s/p R THR revision presents with decreased R LE strength/ROM, post THP and post op pain limiting functional mobility.  Pt should progress to d/c home with family assist and HHPT follow up    PT Assessment  Patient needs continued PT services    Follow Up Recommendations  Home health PT    Does the patient have the potential to tolerate intense rehabilitation      Barriers to Discharge        Equipment Recommendations  None recommended by PT    Recommendations for Other Services OT consult   Frequency 7X/week    Precautions / Restrictions Precautions Precautions: Posterior Hip;Fall Precaution Booklet Issued: Yes (comment) Precaution Comments: sign hung in room Restrictions Weight Bearing Restrictions: No Other Position/Activity Restrictions: WBAT   Pertinent Vitals/Pain 7/10; premed, ice pack provided      Mobility  Bed Mobility Bed Mobility: Supine to Sit Supine to Sit: 4: Min assist Details for Bed Mobility Assistance: cues for sequence, use of L LE to self assist and cues for adherence to post THP Transfers Transfers: Sit to Stand;Stand to Sit Sit to Stand: 4: Min assist Stand to Sit: 4: Min assist Details for Transfer Assistance: cues for LE management and use of UEs to self assist.  Pt attempting to lean past 90 with rise from bed as well as descent into chair Ambulation/Gait Ambulation/Gait Assistance: 4: Min assist Ambulation Distance (Feet): 60 Feet Assistive device: Rolling walker Ambulation/Gait Assistance Details: min cues for sequence, posture and position from RW Gait Pattern: Step-to pattern;Decreased step length - right;Decreased step length - left;Shuffle;Antalgic;Trunk flexed     Exercises Total Joint Exercises Ankle Circles/Pumps: AROM;10 reps;Supine;Both Heel Slides: AAROM;5 reps;Right;Supine Hip ABduction/ADduction: AAROM;5 reps;Supine;Right   PT Diagnosis: Difficulty walking  PT Problem List: Decreased strength;Decreased range of motion;Decreased activity tolerance;Decreased mobility;Decreased knowledge of use of DME;Pain;Decreased knowledge of precautions PT Treatment Interventions: DME instruction;Gait training;Stair training;Functional mobility training;Therapeutic activities;Therapeutic exercise;Patient/family education     PT Goals(Current goals can be found in the care plan section) Acute Rehab PT Goals Patient Stated Goal: Resume previous lifestyle with decreased pain PT Goal Formulation: With patient Time For Goal Achievement: 08/21/13 Potential to Achieve Goals: Good  Visit Information  Last PT Received On: 08/14/13 Assistance Needed: +1       Prior Functioning  Home Living Family/patient expects to be discharged to:: Private residence Living Arrangements: Other relatives (sister) Available Help at Discharge: Family Type of Home: House Home Access: Stairs to enter Secretary/administrator of Steps: 1 Entrance Stairs-Rails: None Home Layout: One level Home Equipment: Environmental consultant - 2 wheels Prior Function Level of Independence: Independent with assistive device(s) Comments: USed cane 2* ongoing R hip and L ankle problems Communication Communication: No difficulties Dominant Hand: Right    Cognition  Cognition Arousal/Alertness: Awake/alert Behavior During Therapy: WFL for tasks assessed/performed Overall Cognitive Status: Within Functional Limits for tasks assessed    Extremity/Trunk Assessment Upper Extremity Assessment Upper Extremity Assessment: RUE deficits/detail RUE Deficits / Details: numbness 2* previous CVA Lower Extremity Assessment Lower Extremity Assessment: RLE deficits/detail RLE Deficits / Details: Hip strength 3-5 with  AAROM to 70 flex adn 20 abd Cervical / Trunk Assessment Cervical / Trunk Assessment: Normal   Balance    End of Session  PT - End of Session Equipment Utilized During Treatment: Gait belt Activity Tolerance: Patient tolerated treatment well Patient left: in chair;with call bell/phone within reach Nurse Communication: Mobility status  GP     Casmere [name] 08/14/2013, 3:23 PM

## 2013-08-14 NOTE — Anesthesia Postprocedure Evaluation (Signed)
  Anesthesia Post-op Note  Patient: Jeffrey [name]  Procedure(s) Performed: Procedure(s) (LRB): RIGHT TOTAL HIP REVISION (Right)  Patient Location: PACU  Anesthesia Type: General  Level of Consciousness: awake and alert   Airway and Oxygen Therapy: Patient Spontanous Breathing  Post-op Pain: mild  Post-op Assessment: Post-op Vital signs reviewed, Patient's Cardiovascular Status Stable, Respiratory Function Stable, Patent Airway and No signs of Nausea or vomiting  Last Vitals:  Filed Vitals:   08/14/13 1105  BP: 134/83  Pulse: 84  Temp: 36.7 C  Resp: 16    Post-op Vital Signs: stable   Complications: No apparent anesthesia complications

## 2013-08-14 NOTE — Plan of Care (Signed)
Problem: Consults Goal: Diagnosis- Total Joint Replacement Revision right hip

## 2013-08-14 NOTE — Brief Op Note (Signed)
08/14/2013  9:21 AM  PATIENT:  Jeffrey [name]  52 y.o. 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  PROCEDURE:  Procedure(s): RIGHT TOTAL HIP REVISION (Right)  Depuy 56mm Gription pinnacle cup  SURGEON:  Surgeon(s) and Role:    * [name], MD - Primary  PHYSICIAN ASSISTANT: Lanney Gins, PA-C  ANESTHESIA:   general  EBL:  Total I/O In: 2000 [I.V.:2000] Out: 625 [Urine:225; Blood:500cc]  BLOOD ADMINISTERED:none  DRAINS: (1 medium) Hemovact drain(s) in the right hip with  Suction Open   LOCAL MEDICATIONS USED:  NONE  SPECIMEN:  No Specimen  DISPOSITION OF SPECIMEN:  N/A  COUNTS:  YES  TOURNIQUET:  * No tourniquets in log *  DICTATION: .Dragon Dictation  PLAN OF CARE: Admit to inpatient   PATIENT DISPOSITION:  PACU - hemodynamically stable.   Delay start of Pharmacological VTE agent (>24hrs) due to surgical blood loss or risk of bleeding: no  Indications of procedure: Jeffrey [name] is a 52 year old male with history of right total hip arthroplasty with ASR with femoral head and acetabular components.  This index procedure was performed approximately 6 years ago. He has already had a revision left total hip arthroplasty for a failed ASR hip with conversion to a ceramic or metal head ball articulating with polyethylene.  Jeffrey [name] family have been patients of mine. Jeffrey [name] subsequently self referred for a second opinion evaluation of this right hip. Due to increasing pain and dysfunction, based on his results with his left total hip he at this point was ready to proceed with a revision right total hip. We reviewed the indications, reviewed his lab work, discussed the postoperative course and expectations.  Risks of infection, DVT, increased risk of early dislocation with revision hip surgery were all discussed. Questions were  encouraged and answered and reviewed regarding his right hip revision surgery.  Of note Jeffrey [name] has had some persistent discomfort in his left hip despite the revision surgery. We will focused our attention on the right hip first and address issues of the left hip as he recovers from his right hip surgery. Consent will be obtained based off our office discussions.  Procedure in detail: Jeffrey [name] was seen in the holding area his right hip was identified and marked.  Consent was signed in the holding area. He was then taken back to the operating room. Once adequate anesthesia and preoperative antibiotics, 900 milligrams of Cleocin IV administered, he was positioned into the left lateral decubitus position with the right side up. His right lower extremity was pre-scrubbed then prepped and draped in a sterile fashion for a posterior approach the hip. His previous incision was demarcated. A timeout was performed identifying the patient, the planned procedure, and the correct lower extremity. The patient's old in incision was excised and soft tissue planes created. The fascia of the iliotibial band and the gluteus maximus were then incised for a posterior approach to the hip. Upon entry into the pseudocapsule we identified a very large effusion with inflammatory characteristics. There was no concern for infection. There was significant evidence of adverse tissue reaction within the hip joint capsule and surrounding synovial tissue.  Upon entry into the hip joint significant time was spent at performing synovectomy encompassing the posterior two thirds of the hip joint. Once exposed and synovectomy carried out the hip joint was dislocated in the femoral head disimpacted  from the trunnion. During exposure I had elevated soft tissues on the ilium and was then able to place the trunnion of the femoral stem on the ilium as we displaced to the femur anteriorly. Following further exposure the acetabular rim the  Innomed explant system was utilized to remove the acetabular shell which was done without significant bone loss.  With this she'll removed and identified a cyst within the anterior acetabular wall which was debrided of metal stained debris. I then identified the anatomic landmarks of the acetabulum recognizing that I was able to medialize the revision shell. I then began reaming with a 51 reamer and reamed up to a 55 reamer. Jeffrey [name] previous acetabular shell was a 58 mm ASR cup. Based on the new acetabular preparation I selected a 56 mm Gription Pinnacle cup. Utilizing acetabular reamings I packed the anterior wall cyst with negative autologous bone graft. The final 56 mm cup was then impacted in seem to see very well within the prepared acetabulum. I placed 2 cancellous screws into the ilium. I confirmed cup orientation using an acetabular cup positioner and a fine abduction of approximately 35-40, I was able to palpate a small rim of bone anteriorly.  At this point I placed a 36 neutral trial liner and performed a trial reduction with a 36+5 trial ball.   During this trial assessment I found the combined anteversion to be approximately 40, his leg lengths appeared to be close if not a little short.  There was evidence of some subluxation in the sleep position with the hip flexed and adducted and internally rotated.  Given these findings during trial reduction the trial components were removed I selected a 36+4 10 face changing liner. This final liner was then impacted with the 10 face at approximately the 9 o'clock position for this right hip. I then re\re trialed with a 36+8.5 ball found the hip to be more stable without evidence of any subluxation in addition found the leg lengths to the more closely reapproximated.   Given these findings a final 36+8.5 delta ceramic ball was opened and impacted onto a clean and prepared trunnion. The hip was irrigated throughout the case and again this point the  hip was then reduced.  At this point I placed a medium Hemovac drain deep and reapproximated the iliotibial banding gluteal fascia using a combination of #1 Vicryl suture and 0 V-lock suture. The remainder of the wound was closed with 2-0 Vicryl in the subcutaneous layer and a running 3-0 Monocryl suture as a subcuticular stitch. The hip was then cleaned dried and dressed sterilely with Dermabond and an aqua cell dressing the drain site dressed separately.    He was then awoken from anesthesia transferred to the recovery room in stable condition tolerating the procedure well. We will allow him to be weightbearing as tolerated he'll be discharged to home with home health physical therapy. He will return to see [place] in the office in 2 weeks.  Mr. Lanney Gins, PA-C was present for the entirety of the case from preoperative positioning to perioperative management of the operative extremity, general facilitation the case, and primary wound closure.

## 2013-08-14 NOTE — Interval H&P Note (Signed)
History and Physical Interval Note:  08/14/2013 7:20 AM  Jeffrey [name]  has presented today for surgery, with the diagnosis of failed right total hip arthroplasty  The various methods of treatment have been discussed with the patient and family. After consideration of risks, benefits and other options for treatment, the patient has consented to  Procedure(s): RIGHT TOTAL HIP REVISION (Right) as a surgical intervention .  The patient's history has been reviewed, patient examined, no change in status, stable for surgery.  I have reviewed the patient's chart and labs.  Questions were answered to the patient's satisfaction.     [name]

## 2013-08-14 NOTE — Progress Notes (Signed)
Utilization review completed.  

## 2013-08-14 NOTE — Preoperative (Signed)
Beta Blockers   Reason not to administer Beta Blockers:Not Applicable 

## 2013-08-14 NOTE — Anesthesia Preprocedure Evaluation (Addendum)
Anesthesia Evaluation  Patient identified by MRN, date of birth, ID band Patient awake    Reviewed: Allergy & Precautions, H&P , NPO status , Patient's Chart, lab work & pertinent test results, reviewed documented beta blocker date and time   Airway Mallampati: III TM Distance: >3 FB Neck ROM: full    Dental  (+) Edentulous Upper, Edentulous Lower and Dental Advisory Given   Pulmonary sleep apnea , Current Smoker,  Undiagnosed sleep apnea breath sounds clear to auscultation  Pulmonary exam normal       Cardiovascular Exercise Tolerance: Good hypertension, Pt. on medications and Pt. on home beta blockers Rhythm:regular Rate:Normal  ASD   Neuro/Psych CVA 2004 CVA negative psych ROS   GI/Hepatic negative GI ROS, Neg liver ROS, GERD-  Medicated and Controlled,  Endo/Other  negative endocrine ROS  Renal/GU negative Renal ROS  negative genitourinary   Musculoskeletal   Abdominal   Peds  Hematology negative hematology ROS (+)   Anesthesia Other Findings   Reproductive/Obstetrics negative OB ROS                         Anesthesia Physical Anesthesia Plan  ASA: III  Anesthesia Plan: General   Post-op Pain Management:    Induction:   Airway Management Planned: Oral ETT  Additional Equipment:   Intra-op Plan:   Post-operative Plan: Extubation in OR  Informed Consent: I have reviewed the patients History and Physical, chart, labs and discussed the procedure including the risks, benefits and alternatives for the proposed anesthesia with the patient or authorized representative who has indicated his/her understanding and acceptance.   Dental Advisory Given  Plan Discussed with: CRNA and Surgeon  Anesthesia Plan Comments:         Anesthesia Quick Evaluation

## 2013-08-14 NOTE — Transfer of Care (Signed)
Immediate Anesthesia Transfer of Care Note  Patient: Jeffrey [name]  Procedure(s) Performed: Procedure(s): RIGHT TOTAL HIP REVISION (Right)  Patient Location: PACU  Anesthesia Type:General  Level of Consciousness: awake, alert  and oriented  Airway & Oxygen Therapy: Patient Spontanous Breathing and Patient connected to face mask oxygen  Post-op Assessment: Report given to PACU RN and Post -op Vital signs reviewed and stable  Post vital signs: Reviewed and stable  Complications: No apparent anesthesia complications

## 2013-08-15 DIAGNOSIS — D500 Iron deficiency anemia secondary to blood loss (chronic): Secondary | ICD-10-CM | POA: Diagnosis not present

## 2013-08-15 DIAGNOSIS — E669 Obesity, unspecified: Secondary | ICD-10-CM | POA: Diagnosis present

## 2013-08-15 LAB — BASIC METABOLIC PANEL
BUN: 18 mg/dL (ref 6–23)
CO2: 29 mEq/L (ref 19–32)
Calcium: 9.4 mg/dL (ref 8.4–10.5)
Chloride: 101 mEq/L (ref 96–112)
Creatinine, Ser: 0.93 mg/dL (ref 0.50–1.35)
GFR calc Af Amer: >90 mL/min (ref >90)
Potassium: 4.2 mEq/L (ref 3.5–5.1)

## 2013-08-15 LAB — CBC
Hemoglobin: 10.7 g/dL — ABNORMAL LOW (ref 13.0–17.0)
MCH: 33.9 pg (ref 26.0–34.0)
MCV: 99.1 fL (ref 78.0–100.0)
Platelets: 185 10*3/uL (ref 150–400)
RBC: 3.16 MIL/uL — ABNORMAL LOW (ref 4.22–5.81)
WBC: 10.4 10*3/uL (ref 4.0–10.5)

## 2013-08-15 MED ORDER — POLYETHYLENE GLYCOL 3350 17 G PO PACK: 17.0000 g | PACK | Freq: Two times a day (BID) | ORAL | Status: DC

## 2013-08-15 MED ORDER — METHOCARBAMOL 500 MG PO TABS: 500.0000 mg | ORAL_TABLET | Freq: Four times a day (QID) | ORAL | Status: DC | PRN

## 2013-08-15 MED ORDER — HYDROCODONE-ACETAMINOPHEN 7.5-325 MG PO TABS: 1.0000 | ORAL_TABLET | ORAL | Status: DC

## 2013-08-15 MED ORDER — ASPIRIN EC 325 MG PO TBEC: 325.0000 mg | DELAYED_RELEASE_TABLET | Freq: Two times a day (BID) | ORAL | Status: AC

## 2013-08-15 MED ORDER — FERROUS SULFATE 325 (65 FE) MG PO TABS: 325.0000 mg | ORAL_TABLET | Freq: Three times a day (TID) | ORAL | Status: DC

## 2013-08-15 MED ORDER — RIVAROXABAN 10 MG PO TABS: 10.0000 mg | ORAL_TABLET | Freq: Every day | ORAL | Status: DC

## 2013-08-15 MED ORDER — DSS 100 MG PO CAPS: 100.0000 mg | ORAL_CAPSULE | Freq: Two times a day (BID) | ORAL | Status: DC

## 2013-08-15 NOTE — Progress Notes (Signed)
   Subjective: 1 Day Post-Op Procedure(s) (LRB): RIGHT TOTAL HIP REVISION (Right)   Patient reports pain as mild, pain well controlled. No events throughout the night. Ready to be discharged home if his pain stays well controlled and does well with PT.  Objective:   VITALS:   Filed Vitals:   08/15/13 0502  BP: 146/91  Pulse: 71  Temp: 98 F (36.7 C)  Resp: 18    Neurovascular intact Dorsiflexion/Plantar flexion intact Incision: dressing C/D/I No cellulitis present Compartment soft  LABS  Recent Labs  08/15/13 0405  HGB 10.7*  HCT 31.3*  WBC 10.4  PLT 185     Recent Labs  08/15/13 0405  NA 136  K 4.2  BUN 18  CREATININE 0.93  GLUCOSE 148*     Assessment/Plan: 1 Day Post-Op Procedure(s) (LRB): RIGHT TOTAL HIP REVISION (Right) HV drain d/c'ed Foley cath d/c'ed Advance diet Up with therapy D/C IV fluids Discharge home with home health Follow up in 2 weeks at [facility]. Follow up with OLIN,Santo Zahradnik D in 2 weeks.  Contact information:  [facility] [address] [place] [address] [phone]    Expected ABLA  Treated with iron and will observe  Obese (BMI 30-39.9) Estimated body mass index is 30.14 kg/(m^2) as calculated from the following:   Height as of this encounter: 5\' 11"  (1.803 m).   Weight as of this encounter: 97.977 kg (216 lb). Patient also counseled that weight may inhibit the healing process Patient counseled that losing weight will help with future health issues       Anastasio Auerbach. January Bergthold   PAC  08/15/2013, 8:26 AM

## 2013-08-15 NOTE — Evaluation (Signed)
Occupational Therapy Evaluation Patient Details Name: Jeffrey [name] MRN: [id number] DOB: 01-23-1961 Today's Date: 08/15/2013 Time: 7829-5621 OT Time Calculation (min): 17 min  OT Assessment / Plan / Recommendation History of present illness R THR revision   Clinical Impression   Pt will benefit from skilled OT services to maximize ADL independence for d/c home with family assist. Needs reinforcement with THPs.    OT Assessment  Patient needs continued OT Services    Follow Up Recommendations  Home health OT;Supervision/Assistance - 24 hour    Barriers to Discharge      Equipment Recommendations  3 in 1 bedside comode (only if pt doesnt have one already)    Recommendations for Other Services    Frequency  Min 2X/week    Precautions / Restrictions Precautions Precautions: Posterior Hip;Fall Precaution Comments: reviewed hip precautions with pt. Restrictions Weight Bearing Restrictions: No Other Position/Activity Restrictions: WBAT   Pertinent Vitals/Pain 8/10 R hip; reposition, ice    ADL  Eating/Feeding: Independent Where Assessed - Eating/Feeding: Chair Grooming: Wash/dry hands;Min guard Where Assessed - Grooming: Unsupported standing Upper Body Bathing: Chest;Left arm;Right arm;Abdomen;Set up Where Assessed - Upper Body Bathing: Unsupported sitting Lower Body Bathing: Moderate assistance (without AE) Where Assessed - Lower Body Bathing: Supported sit to stand Upper Body Dressing: Set up Where Assessed - Upper Body Dressing: Unsupported sitting Lower Body Dressing: Moderate assistance (without AE) Where Assessed - Lower Body Dressing: Supported sit to stand Toilet Transfer: Hydrographic surveyor Method: Stand pivot Toileting - Architect and Hygiene: Min guard Where Assessed - Engineer, mining and Hygiene: Standing Equipment Used: Rolling walker ADL Comments: Discussed AE as pt does own all AE. He states he didnt use all AE with  previous surgeries but "worked the pants up with (his) toes" Emphasized importance of not turning R toes inward and not exceeding 90 angle of hip. Expressed to pt importance of either having assist to get pants, etc up to knee before he reaches for them OR use AE. Demonstrated reacher to doff sock and don pants and pt practiced with sock aid to don R sock. Demonstrated use of long sponge also. Pt states his sister can help too. He plans to sponge bathe initially but discussed with pt tub transfer technique when ready and he has a tub seat. Recommended pt use initially for showering. He needed intermittant verbal cues during session for THPs. He tends to move quickly at times. Right after emphasizing to not reach below knee, pt leaned forward to scratch below his knee and OT again emphasized to NOT exceed his 90 degree angle. Reviewed again hip precautions and importance of following them.     OT Diagnosis: Generalized weakness  OT Problem List: Decreased strength OT Treatment Interventions: Self-care/ADL training;DME and/or AE instruction;Therapeutic activities;Patient/family education   OT Goals(Current goals can be found in the care plan section) Acute Rehab OT Goals Patient Stated Goal: Resume previous lifestyle with decreased pain OT Goal Formulation: With patient Time For Goal Achievement: 08/22/13 Potential to Achieve Goals: Good  Visit Information  Last OT Received On: 08/15/13 Assistance Needed: +1 History of Present Illness: R THR revision       Prior Functioning     Home Living Family/patient expects to be discharged to:: Private residence Living Arrangements: Other relatives (sister) Available Help at Discharge: Family Type of Home: House Home Access: Stairs to enter Secretary/administrator of Steps: 1 Entrance Stairs-Rails: None Home Layout: One level Home Equipment: Environmental consultant - 2 wheels;Adaptive equipment Adaptive  Equipment: Reacher;Sock aid;Long-handled shoe  horn;Long-handled sponge Additional Comments: pt states he thinks he has a 3in1 he can use but will verify and let nursing know if he needs a 3in1 Prior Function Level of Independence: Independent with assistive device(s) Comments: USed cane 2* ongoing R hip and L ankle problems Communication Communication: No difficulties Dominant Hand: Right         Vision/Perception     Cognition  Cognition Arousal/Alertness: Awake/alert Behavior During Therapy: WFL for tasks assessed/performed Overall Cognitive Status: Within Functional Limits for tasks assessed    Extremity/Trunk Assessment Upper Extremity Assessment RUE Deficits / Details: numbness 2* previous CVA per PT note; pt uses UEs in activity [facility]     Mobility Transfers Transfers: Sit to Stand;Stand to Sit Sit to Stand: 4: Min guard;With upper extremity assist;From chair/3-in-1 Stand to Sit: 4: Min guard;With upper extremity assist Details for Transfer Assistance: verbal cues for technique and THPs.     Exercise     Balance Balance Balance Assessed: Yes Static Standing Balance Static Standing - Level of Assistance: 5: Stand by assistance   End of Session OT - End of Session Equipment Utilized During Treatment: Rolling walker Activity Tolerance: Patient tolerated treatment well Patient left: in chair;with call bell/phone within reach  GO     Lennox Laity [phone] 08/15/2013, 10:00 AM

## 2013-08-15 NOTE — Progress Notes (Signed)
Physical Therapy Treatment Patient Details Name: Jeffrey [name] MRN: [id number] DOB: [birth year] Today's Date: 08/15/2013 Time: 4696-2952 PT Time Calculation (min): 13 min  PT Assessment / Plan / Recommendation  History of Present Illness R THR revision   PT Comments   Pt continues to demonstrate decreased safety with mobility and requires supervision and max cues for maintaining THP.    Follow Up Recommendations  Home health PT     Does the patient have the potential to tolerate intense rehabilitation     Barriers to Discharge        Equipment Recommendations  None recommended by PT    Recommendations for Other Services    Frequency 7X/week   Progress towards PT Goals Progress towards PT goals: Progressing toward goals  Plan Current plan remains appropriate    Precautions / Restrictions Precautions Precautions: Posterior Hip;Fall Precaution Booklet Issued: Yes (comment) Precaution Comments: reviewed hip precautions with   Some difficulty transitioning into functional terms, with cues throughout, esp bed mobility.  Restrictions Weight Bearing Restrictions: No Other Position/Activity Restrictions: WBAT   Pertinent Vitals/Pain 2/10    Mobility  Bed Mobility Bed Mobility: Not assessed (Pt sitting at EOB) Supine to Sit: 6: Modified independent (Device/Increase time) Sit to Supine: 6: Modified independent (Device/Increase time) Transfers Transfers: Sit to Stand;Stand to Sit Sit to Stand: 5: Supervision Stand to Sit: 5: Supervision Details for Transfer Assistance: Continues to require cues for maintaining THP when sitting/standing.  Ambulation/Gait Ambulation/Gait Assistance: 5: Supervision Ambulation Distance (Feet): 250 Feet Assistive device: Rolling walker Ambulation/Gait Assistance Details: Supervision for safety.  Gait Pattern: Step-to pattern;Decreased step length - right;Decreased step length - left;Shuffle;Antalgic;Trunk flexed Stairs: Yes Stairs Assistance: 5:  Supervision Stairs Assistance Details (indicate cue type and reason): min cues for safety and technique, again pt is very impulsive with mobility.  Stair Management Technique: No rails;Step to pattern;Backwards;Forwards;With walker Number of Stairs: 1    Exercises     PT Diagnosis:    PT Problem List:   PT Treatment Interventions:     PT Goals (current goals can now be found in the care plan section) Acute Rehab PT Goals Patient Stated Goal: Resume previous lifestyle with decreased pain PT Goal Formulation: With patient Time For Goal Achievement: 08/21/13 Potential to Achieve Goals: Good  Visit Information  Last PT Received On: 08/15/13 Assistance Needed: +1 History of Present Illness: R THR revision    Subjective Data  Subjective: I'm ready to get out of here.  Patient Stated Goal: Resume previous lifestyle with decreased pain   Cognition  Cognition Arousal/Alertness: Awake/alert Behavior During Therapy: WFL for tasks assessed/performed Overall Cognitive Status: Within Functional Limits for tasks assessed    Balance  Balance Balance Assessed: Yes Static Standing Balance Static Standing - Level of Assistance: 5: Stand by assistance  End of Session PT - End of Session Activity Tolerance: Patient tolerated treatment well Patient left: in bed;with call bell/phone within reach Nurse Communication: Mobility status   GP     Vista Deck 08/15/2013, 1:28 PM

## 2013-08-15 NOTE — Progress Notes (Signed)
Physical Therapy Treatment Patient Details Name: Jeffrey [name] MRN: [id number] DOB: [birth year] Today's Date: 08/15/2013 Time: 6213-0865 PT Time Calculation (min): 14 min  PT Assessment / Plan / Recommendation  History of Present Illness R THR revision   PT Comments   Pt progressing very well with mobility and is supervision for ambulation and stair negotiation.  He is somewhat impulsive with mobility and there are some safety concerns with following THP with functional activities, although he is able to state all three THP to therapist.    Follow Up Recommendations  Home health PT     Does the patient have the potential to tolerate intense rehabilitation     Barriers to Discharge        Equipment Recommendations  None recommended by PT    Recommendations for Other Services    Frequency 7X/week   Progress towards PT Goals Progress towards PT goals: Progressing toward goals  Plan Current plan remains appropriate    Precautions / Restrictions Precautions Precautions: Posterior Hip;Fall Precaution Booklet Issued: Yes (comment) Precaution Comments: reviewed hip precautions with   Some difficulty transitioning into functional terms, with cues throughout, esp bed mobility.  Restrictions Weight Bearing Restrictions: No Other Position/Activity Restrictions: WBAT   Pertinent Vitals/Pain 3/10, repositioned    Mobility  Bed Mobility Bed Mobility: Supine to Sit;Sit to Supine Supine to Sit: 6: Modified independent (Device/Increase time) Sit to Supine: 6: Modified independent (Device/Increase time) Transfers Transfers: Sit to Stand;Stand to Sit Sit to Stand: 5: Supervision Stand to Sit: 5: Supervision Details for Transfer Assistance: Supervision for safety as pt is somewhat impulsive to stand without therapist being ready or having RW in front of him.  Ambulation/Gait Ambulation/Gait Assistance: 5: Supervision Ambulation Distance (Feet): 200 Feet Assistive device: Rolling  walker Ambulation/Gait Assistance Details: Min cues for ensuring THP when turning.  Gait Pattern: Step-to pattern;Decreased step length - right;Decreased step length - left;Shuffle;Antalgic;Trunk flexed Stairs: Yes Stairs Assistance: 5: Supervision Stairs Assistance Details (indicate cue type and reason): min cues for safety and technique, again pt is very impulsive with mobility.  Stair Management Technique: No rails;Step to pattern;Backwards;Forwards;With walker Number of Stairs: 1    Exercises     PT Diagnosis:    PT Problem List:   PT Treatment Interventions:     PT Goals (current goals can now be found in the care plan section) Acute Rehab PT Goals Patient Stated Goal: Resume previous lifestyle with decreased pain PT Goal Formulation: With patient Time For Goal Achievement: 08/21/13 Potential to Achieve Goals: Good  Visit Information  Last PT Received On: 08/15/13 Assistance Needed: +1 History of Present Illness: R THR revision    Subjective Data  Subjective: I'm ready to get out of here.  Patient Stated Goal: Resume previous lifestyle with decreased pain   Cognition  Cognition Arousal/Alertness: Awake/alert Behavior During Therapy: WFL for tasks assessed/performed Overall Cognitive Status: Within Functional Limits for tasks assessed    Balance  Balance Balance Assessed: Yes Static Standing Balance Static Standing - Level of Assistance: 5: Stand by assistance  End of Session PT - End of Session Activity Tolerance: Patient tolerated treatment well Patient left: in bed;with call bell/phone within reach Nurse Communication: Mobility status   GP     Vista Deck 08/15/2013, 12:09 PM

## 2013-08-16 NOTE — Care Management Note (Signed)
    Page 1 of 2   08/16/2013     10:26:38 AM   CARE MANAGEMENT NOTE 08/16/2013  Patient:  Jeffrey [name], Jeffrey [name]   Account Number:  [id number]  Date Initiated:  08/15/2013  Documentation initiated by:  Colleen Can  Subjective/Objective Assessment:   DX FAILED RT TOTAL HIP ARTHROPLASTY; REVISION TOTAL HIP    Pre-arranged with [facility], services will start tomorrow.     Action/Plan:   CM spoke with patient. Plans are for patient to return to his home where sister will be caregiver. He will need RW.  Does not need commode seat .Genevieve Norlander will provide [facility] services.   Anticipated DC Date:  08/15/2013   Anticipated DC Plan:  HOME W HOME HEALTH SERVICES      DC Planning Services  CM consult      PAC Choice  DURABLE MEDICAL EQUIPMENT  HOME HEALTH   Choice offered to / List presented to:  C-1 Patient   DME arranged  Levan Hurst      DME agency  [facility]     HH arranged  HH-2 PT      [facility] agency  [facility]   Status of service:  Completed, signed off Medicare Important Message given?   (If response is "NO", the following Medicare IM given date fields will be blank) Date Medicare IM given:   Date Additional Medicare IM given:    Discharge Disposition:  HOME W HOME HEALTH SERVICES  Per UR Regulation:    If discussed at Long Length of Stay Meetings, dates discussed:    Comments:

## 2013-08-18 NOTE — Discharge Summary (Signed)
Physician Discharge Summary  Patient ID: Jeffrey [name] MRN: [id number] DOB/AGE: [birth year] 52 y.o.  Admit date: 08/14/2013 Discharge date: 08/15/2013   Procedures:  Procedure(s) (LRB): RIGHT TOTAL HIP REVISION (Right)  Attending Physician:  Dr. Durene Romans   Admission Diagnoses:   Right hip pain previous metal-on-metal hip arthroplasty  Discharge Diagnoses:  Principal Problem:   S/P right hip revision Active Problems:   Expected blood loss anemia   Obese  Past Medical History  Diagnosis Date  . Hypertension   . Depression   . GERD (gastroesophageal reflux disease)   . Heart murmur     has hole in his heart  . Sleep apnea     undiagnosed  . Kidney calculus   . Closed fracture of lateral malleolus 01/04/2008    Qualifier: History of  By: Sharen Hones  MD, Wynona Canes    . Complication of anesthesia     "problems with my breathing- states was told had sleep apnea, but not ever tested"  . Stroke 2004    patient has residual numbness  rt arm  . Arthritis   . Peripheral vascular disease     DVT 2004-  left leg  . Presence of IVC filter     right femoral    HPI: Jeffrey [name], 52 y.o. male, has a history of pain and functional disability in the right hip(s) due toface wear of previous prosthetic joint replacement. Onset of symptoms was gradual starting 2 years ago with gradually worsening course since that time.The patient noted prior procedures of the hip to include arthroplasty on the right hip(s). Patient currently rates pain in the right hip at 10 out of 10 with activity. Patient has night pain, worsening of pain with activity and weight bearing, trendelenberg gait, pain that interfers with activities of daily living and pain with passive range of motion. Patient has evidence of previous metal-on-metal total hip arthroplasty by imaging studies. This condition presents safety issues increasing the risk of falls. There is no current active infection. Labs work reveals both  elevevated Cobalt and Chromium levels. Risks, benefits and expectations were discussed with the patient. Patient understand the risks, benefits and expectations and wishes to proceed with surgery.   PCP: Mat Carne, MD   Discharged Condition: good  Hospital Course:  Patient underwent the above stated procedure on 08/14/2013. Patient tolerated the procedure well and brought to the recovery room in good condition and subsequently to the floor.  POD #1 BP: 146/91 ; Pulse: 71 ; Temp: 98 F (36.7 C) ; Resp: 18  Pt's foley was removed, as well as the hemovac drain removed. IV was changed to a saline lock. Patient reports pain as mild, pain well controlled. No events throughout the night. Ready to be discharged home. Neurovascular intact, dorsiflexion/plantar flexion intact, incision: dressing C/D/I, no cellulitis present and compartment soft.   LABS  Basename    HGB  10.7  HCT  31.3    Discharge Exam: General appearance: alert, cooperative and no distress Extremities: Homans sign is negative, no sign of DVT, no edema, redness or tenderness in the calves or thighs and no ulcers, gangrene or trophic changes  Disposition:   Home-Health Care Svc with follow up in 2 weeks   Follow-up Information   Follow up with [name], MD. Schedule an appointment as soon as possible for a visit in 2 weeks.   Specialty:  Orthopedic Surgery   Contact information:   [address] [place] [address]  [phone]       Discharge Orders   Future Orders Complete By Expires   Call MD / Call 911  As directed    Comments:     If you experience chest pain or shortness of breath, CALL 911 and be transported to the hospital emergency room.  If you develope a fever above 101 F, pus (white drainage) or increased drainage or redness at the wound, or calf pain, call your surgeon's office.   Change dressing  As directed    Comments:     Maintain surgical dressing for 10-14 days,  then replace with 4x4 guaze and tape. Keep the area dry and clean.   Constipation Prevention  As directed    Comments:     Drink plenty of fluids.  Prune juice may be helpful.  You may use a stool softener, such as Colace (over the counter) 100 mg twice a day.  Use MiraLax (over the counter) for constipation as needed.   Diet - low sodium heart healthy  As directed    Discharge instructions  As directed    Comments:     Maintain surgical dressing for 10-14 days, then replace with gauze and tape. Keep the area dry and clean until follow up. Follow up in 2 [facility] at [facility]. Call with any questions or concerns.   Increase activity slowly as tolerated  As directed    TED hose  As directed    Comments:     Use stockings (TED hose) for 2 weeks on both leg(s).  You may remove them at night for sleeping.   Weight bearing as tolerated  As directed    Questions:     Laterality:     Extremity:          Medication List    STOP taking these medications       diclofenac sodium 1 % Gel  Commonly known as:  VOLTAREN     ibuprofen 200 MG tablet  Commonly known as:  ADVIL,MOTRIN      TAKE these medications       amLODipine 10 MG tablet  Commonly known as:  NORVASC  Take 10 mg by mouth daily.     aspirin EC 325 MG tablet  Take 1 tablet (325 mg total) by mouth 2 (two) times daily. Take for 4 weeks.     buPROPion 150 MG 12 hr tablet  Commonly known as:  WELLBUTRIN SR  Take 150 mg by mouth 2 (two) times daily.     carvedilol 25 MG tablet  Commonly known as:  COREG  Take 1 tablet (25 mg total) by mouth 2 (two) times daily with a meal.     citalopram 20 MG tablet  Commonly known as:  CELEXA  Take 20 mg by mouth 2 (two) times daily.     doxazosin 4 MG tablet  Commonly known as:  CARDURA  Take 4 mg by mouth at bedtime.     doxycycline 100 MG capsule  Commonly known as:  VIBRAMYCIN  Take 100 mg by mouth daily.     DSS 100 MG Caps  Take 100 mg by mouth 2 (two) times  daily.     fentaNYL 50 MCG/HR  Commonly known as:  DURAGESIC - dosed mcg/hr  Place 1 patch onto the skin every 3 (three) days.     ferrous sulfate 325 (65 FE) MG tablet  Take 1 tablet (325 mg total) by mouth 3 (three) times daily after meals.  gabapentin 300 MG capsule  Commonly known as:  NEURONTIN  Take 600-900 mg by mouth 2 (two) times daily as needed (pain). Use 2 tabs in the morning and 3 tabs nightly. Pain     hydrALAZINE 25 MG tablet  Commonly known as:  APRESOLINE  Take 25 mg by mouth 3 (three) times daily.     HYDROcodone-acetaminophen 7.5-325 MG per tablet  Commonly known as:  NORCO  Take 1-2 tablets by mouth every 4 (four) hours.     methocarbamol 500 MG tablet  Commonly known as:  ROBAXIN  Take 1 tablet (500 mg total) by mouth every 6 (six) hours as needed for muscle spasms.     naphazoline 0.012 % ophthalmic solution  Commonly known as:  CLEAR EYES  Place 2 drops into both eyes daily.     Omega 3 1000 MG Caps  Take 2 capsules by mouth 2 (two) times daily.     pantoprazole 40 MG tablet  Commonly known as:  PROTONIX  Take 1 tablet (40 mg total) by mouth daily.     polyethylene glycol packet  Commonly known as:  MIRALAX / GLYCOLAX  Take 17 g by mouth 2 (two) times daily.     rivaroxaban 10 MG Tabs tablet  Commonly known as:  XARELTO  Take 1 tablet (10 mg total) by mouth daily before breakfast.     testosterone cypionate 200 MG/ML injection  Commonly known as:  DEPOTESTOTERONE CYPIONATE  Inject 400 mg into the muscle every 14 (fourteen) days.     URIBEL 118 MG Caps  Take 1 capsule (118 mg total) by mouth 4 (four) times daily as needed (bladder spasm).     varenicline 1 MG tablet  Commonly known as:  CHANTIX CONTINUING MONTH PAK  Take 1 tablet (1 mg total) by mouth 2 (two) times daily.         Signed: Anastasio Auerbach. Hera Celaya   PAC  08/18/2013, 11:37 AM

## 2013-08-21 ENCOUNTER — Other Ambulatory Visit: Payer: Self-pay | Admitting: Family Medicine

## 2013-08-28 ENCOUNTER — Other Ambulatory Visit: Payer: Self-pay | Admitting: Family Medicine

## 2013-08-28 ENCOUNTER — Encounter: Payer: Self-pay | Admitting: Family Medicine

## 2013-08-28 ENCOUNTER — Ambulatory Visit (INDEPENDENT_AMBULATORY_CARE_PROVIDER_SITE_OTHER): Payer: Medicare Other | Admitting: Family Medicine

## 2013-08-28 VITALS — BP 138/87 | HR 90 | Ht 71.0 in | Wt 216.0 lb

## 2013-08-28 DIAGNOSIS — E349 Endocrine disorder, unspecified: Secondary | ICD-10-CM

## 2013-08-28 DIAGNOSIS — F329 Major depressive disorder, single episode, unspecified: Secondary | ICD-10-CM

## 2013-08-28 DIAGNOSIS — E291 Testicular hypofunction: Secondary | ICD-10-CM

## 2013-08-28 MED ORDER — TESTOSTERONE CYPIONATE 200 MG/ML IM SOLN: 400.0000 mg | INTRAMUSCULAR | Status: DC

## 2013-08-28 NOTE — Patient Instructions (Signed)
Dear Mr. Misner,   It was great to see you today, and I'm glad that your new hip is treating you well. I am also excited that you are doing so well on Chantix.   1. Testosterone - I sent a prescription to the pharmacy. They will let me know if prior authorization is needed. If you have not heard anything by next Monday, then please call me.   2. Wellbutrin - Please go to one table daily for 2 weeks and then stop.   Please come back in 4 weeks for a blood pressure follow up and to see how you are doing off Wellbutrin.   Dr. Clinton Sawyer

## 2013-08-28 NOTE — Progress Notes (Signed)
  Subjective:    Patient ID: Jeffrey [name], male    DOB: [birth year], 52 y.o.   MRN: [id number]  HPI  52 year old M who is s/p recent right hip revision.   Medication Management - Pt taking medication that may interact with celexa which he is is concerned about. Specifically, he is taking Wellbutrin SR 150 mg BID. He has been taking both medications for several years and does not think that Wellbutrin has improved his mood. He notes intermittent flushing and high heart rate but cannot state when this happened or how long it took.    Testosterone Deficiency - Pt with history of testosterone deficiency untreated for several years. He notes decreased energy and libido. This was discussed at previous visit, but patient denies receiving the prescriptions from the pharmacy. He is interested in re-starting. Last testosterone level in April was 96 ng/dL (nl [phone]).     Current Outpatient Prescriptions on File Prior to Visit  Medication Sig Dispense Refill  . amLODipine (NORVASC) 10 MG tablet Take 10 mg by mouth daily.      [name] aspirin EC 325 MG tablet Take 1 tablet (325 mg total) by mouth 2 (two) times daily. Take for 4 weeks.  60 tablet  0  . buPROPion (WELLBUTRIN SR) 150 MG 12 hr tablet Take 150 mg by mouth 2 (two) times daily.      . carvedilol (COREG) 25 MG tablet Take 1 tablet (25 mg total) by mouth 2 (two) times daily with a meal.  60 tablet  11  . citalopram (CELEXA) 20 MG tablet Take 20 mg by mouth 2 (two) times daily.      [name] doxazosin (CARDURA) 4 MG tablet Take 4 mg by mouth at bedtime.      [name] doxycycline (VIBRAMYCIN) 100 MG capsule Take 100 mg by mouth daily.      . hydrALAZINE (APRESOLINE) 25 MG tablet Take 25 mg by mouth 3 (three) times daily.      [name] HYDROcodone-acetaminophen (NORCO) 7.5-325 MG per tablet Take 1-2 tablets by mouth every 4 (four) hours.  100 tablet  0  . methocarbamol (ROBAXIN) 500 MG tablet Take 1 tablet (500 mg total) by mouth every 6 (six) hours as needed for muscle  spasms.  50 tablet  0  . naphazoline (CLEAR EYES) 0.012 % ophthalmic solution Place 2 drops into both eyes daily.      . pantoprazole (PROTONIX) 40 MG tablet Take 1 tablet (40 mg total) by mouth daily.  31 tablet  5  . polyethylene glycol (MIRALAX / GLYCOLAX) packet Take 17 g by mouth 2 (two) times daily.  14 each  0  . varenicline (CHANTIX CONTINUING MONTH PAK) 1 MG tablet Take 1 tablet (1 mg total) by mouth 2 (two) times daily.  60 tablet  10  . testosterone cypionate (DEPOTESTOTERONE CYPIONATE) 200 MG/ML injection Inject 400 mg into the muscle every 14 (fourteen) days.       No current facility-administered medications on file prior to visit.     Review of Systems See HPI    Objective:   Physical Exam BP 138/87  Pulse 90  Ht 5\' 11"  (1.803 m)  Wt 216 lb (97.977 kg)  BMI 30.14 kg/m2  Gen: well appearing, non distressed, pleasant and conversant CV: RRR, no murmurs Skin: warm, dry, no flushing      Assessment & Plan:

## 2013-08-29 ENCOUNTER — Telehealth: Payer: Self-pay | Admitting: *Deleted

## 2013-08-29 NOTE — Telephone Encounter (Signed)
Form faxed to number provided

## 2013-08-29 NOTE — Telephone Encounter (Signed)
Form completed.

## 2013-08-29 NOTE — Telephone Encounter (Signed)
Prior authorization form for testosterone placed in MD box.

## 2013-08-30 ENCOUNTER — Telehealth: Payer: Self-pay

## 2013-08-30 NOTE — Telephone Encounter (Signed)
Jeffrey [name] from [facility] [employer] calls to inform us that Testosterone medication has been approve with approval date of 08/29/13 for a year. A letter with approval will be mailed to patient.

## 2013-08-31 NOTE — Assessment & Plan Note (Signed)
A: persistent symptoms no need to re-test testosterone level at this time P: re-order testosterone cypionate, pt will bring to clinic for injections, prior authorizations completed

## 2013-08-31 NOTE — Assessment & Plan Note (Signed)
A: possible interactions between Wellbutrin and celexa P: Taper wellbutrin to 1 tab daily for 2 weeks and then stop

## 2013-09-05 ENCOUNTER — Ambulatory Visit (INDEPENDENT_AMBULATORY_CARE_PROVIDER_SITE_OTHER): Payer: Medicare Other | Admitting: *Deleted

## 2013-09-05 DIAGNOSIS — E291 Testicular hypofunction: Secondary | ICD-10-CM

## 2013-09-05 MED ORDER — TESTOSTERONE CYPIONATE 200 MG/ML IM SOLN
400.0000 mg | INTRAMUSCULAR | Status: DC
  Administered 2013-09-05: 400 mg via INTRAMUSCULAR

## 2013-09-05 NOTE — Progress Notes (Signed)
Patient in today for testosterone injection. Injection given in left upper outer quadrant, site unremarkable, patient without complaints. Appointment scheduled for next injection on 12/16, patient aware.

## 2013-09-19 ENCOUNTER — Ambulatory Visit: Payer: Medicare Other

## 2013-09-19 ENCOUNTER — Ambulatory Visit (INDEPENDENT_AMBULATORY_CARE_PROVIDER_SITE_OTHER): Payer: Medicare Other | Admitting: *Deleted

## 2013-09-19 DIAGNOSIS — E291 Testicular hypofunction: Secondary | ICD-10-CM

## 2013-09-19 MED ORDER — TESTOSTERONE CYPIONATE 200 MG/ML IM SOLN
400.0000 mg | INTRAMUSCULAR | Status: DC
  Administered 2013-09-19: 400 mg via INTRAMUSCULAR

## 2013-09-19 NOTE — Progress Notes (Signed)
Patient in today for testosterone shot. Injection given in left ventrogluteal, patient without complaints, site unremarkable. Next injection to be given in 14 days, appointment already scheduled.

## 2013-09-20 ENCOUNTER — Other Ambulatory Visit: Payer: Self-pay | Admitting: Family Medicine

## 2013-09-27 ENCOUNTER — Telehealth: Payer: Self-pay | Admitting: Family Medicine

## 2013-09-27 MED ORDER — DOXYCYCLINE HYCLATE 100 MG PO CAPS: 100.0000 mg | ORAL_CAPSULE | Freq: Every day | ORAL | Status: DC

## 2013-09-27 NOTE — Telephone Encounter (Signed)
Left message asking why pt needs to be on doxycycline. Will given prescription for 7 days as requested, but pt needs appointment after that.

## 2013-10-03 ENCOUNTER — Ambulatory Visit (INDEPENDENT_AMBULATORY_CARE_PROVIDER_SITE_OTHER): Payer: Medicare Other | Admitting: *Deleted

## 2013-10-03 DIAGNOSIS — E291 Testicular hypofunction: Secondary | ICD-10-CM

## 2013-10-03 MED ORDER — TESTOSTERONE CYPIONATE 200 MG/ML IM SOLN
400.0000 mg | INTRAMUSCULAR | Status: DC
  Administered 2013-10-03: 400 mg via INTRAMUSCULAR

## 2013-10-03 NOTE — Progress Notes (Signed)
Patient in today for testosterone injection. Injection given in right ventrogluteal, patient without complaints, site unremarkable. Next injection scheduled for January 13, patient aware.

## 2013-10-17 ENCOUNTER — Ambulatory Visit (INDEPENDENT_AMBULATORY_CARE_PROVIDER_SITE_OTHER): Payer: Medicare Other | Admitting: *Deleted

## 2013-10-17 DIAGNOSIS — E291 Testicular hypofunction: Secondary | ICD-10-CM

## 2013-10-17 MED ORDER — TESTOSTERONE CYPIONATE 200 MG/ML IM SOLN
400.0000 mg | INTRAMUSCULAR | Status: DC
  Administered 2013-10-17: 400 mg via INTRAMUSCULAR

## 2013-10-17 NOTE — Progress Notes (Signed)
Patient in today for testosterone injection. Injection given in right ventrogluteal, patient without complaints, site unremarkable. Patient to return in 2 weeks for next injection.

## 2013-10-31 ENCOUNTER — Ambulatory Visit (INDEPENDENT_AMBULATORY_CARE_PROVIDER_SITE_OTHER): Payer: Medicare Other | Admitting: *Deleted

## 2013-10-31 DIAGNOSIS — E291 Testicular hypofunction: Secondary | ICD-10-CM

## 2013-10-31 MED ORDER — TESTOSTERONE CYPIONATE 200 MG/ML IM SOLN
400.0000 mg | Freq: Once | INTRAMUSCULAR | Status: AC
  Administered 2013-10-31: 400 mg via INTRAMUSCULAR

## 2013-10-31 NOTE — Progress Notes (Signed)
   Pt in clinic for testosterone cypionate 400 mg injection.  Injection given Left Outer Upper Quadrant.  Tolerated injection without difficultly.  Pt to return in 2 weeks for next injection 11/14/2013 at 2:30 PM.  Clovis PuMartin, Tamika L, RN

## 2013-11-14 ENCOUNTER — Ambulatory Visit (INDEPENDENT_AMBULATORY_CARE_PROVIDER_SITE_OTHER): Payer: Medicare Other | Admitting: *Deleted

## 2013-11-14 DIAGNOSIS — E291 Testicular hypofunction: Secondary | ICD-10-CM

## 2013-11-14 MED ORDER — TESTOSTERONE CYPIONATE 200 MG/ML IM SOLN
400.0000 mg | INTRAMUSCULAR | Status: DC
  Administered 2013-11-14: 400 mg via INTRAMUSCULAR

## 2013-11-14 MED ORDER — TESTOSTERONE CYPIONATE 100 MG/ML IM SOLN: 400.0000 mg | INTRAMUSCULAR | Status: DC

## 2013-11-14 NOTE — Progress Notes (Signed)
   Pt in clinic for his 2 week injection for testosteron cypionate 400 mg IM.  Injection given right upper outer quadrant, without difficultly.  Appt 11/28/2013 at 2:30 PM for next injection. Clovis PuMartin, Karren Newland L, RN

## 2013-11-16 IMAGING — CR DG PORTABLE PELVIS
1 series · 1 of 1 positions shown · non-contrast
Comparison: 02/11/2013 MR.

CLINICAL DATA: Postop right knee replacement.

EXAM:
PORTABLE PELVIS 1-2 VIEWS; PORTABLE RIGHT HIP - 1 VIEW

[AP]
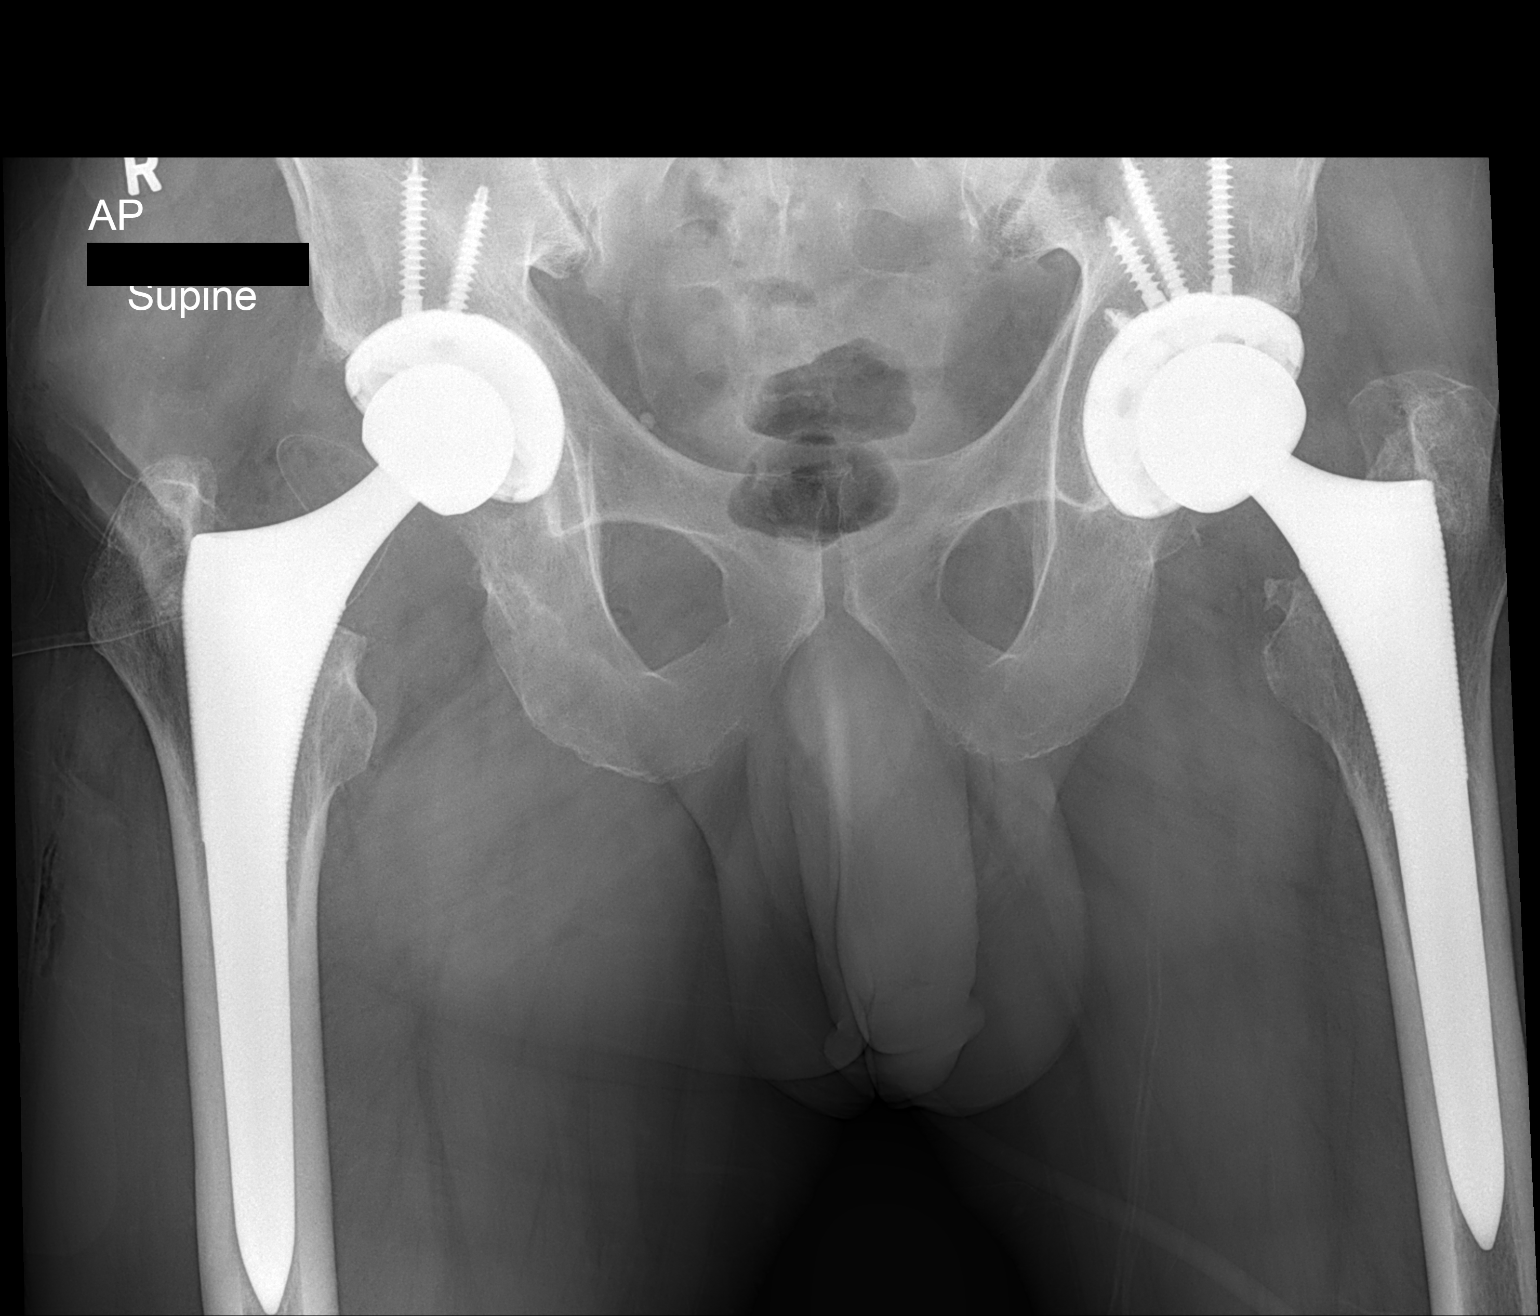

[1 of 1 positions shown; findings below may reference images not displayed]

FINDINGS: Post right total hip replacement. The femoral tip is not completely
delineated on the frontal view. On the lateral view, prominent
trabecular near the tip but without fracture identified.

Prior left total hip replacement without obvious complication.
IMPRESSION: Post right total hip replacement. The femoral tip is not completely
delineated on the frontal view. On the lateral view, prominent
trabecular near the tip but without fracture identified.

## 2013-11-16 IMAGING — CR DG HIP 1V PORT*R*
1 series · 1 of 1 positions shown · non-contrast
Comparison: 02/11/2013 MR.

CLINICAL DATA: Postop right knee replacement.

EXAM:
PORTABLE PELVIS 1-2 VIEWS; PORTABLE RIGHT HIP - 1 VIEW

[shoot- thru lateral]
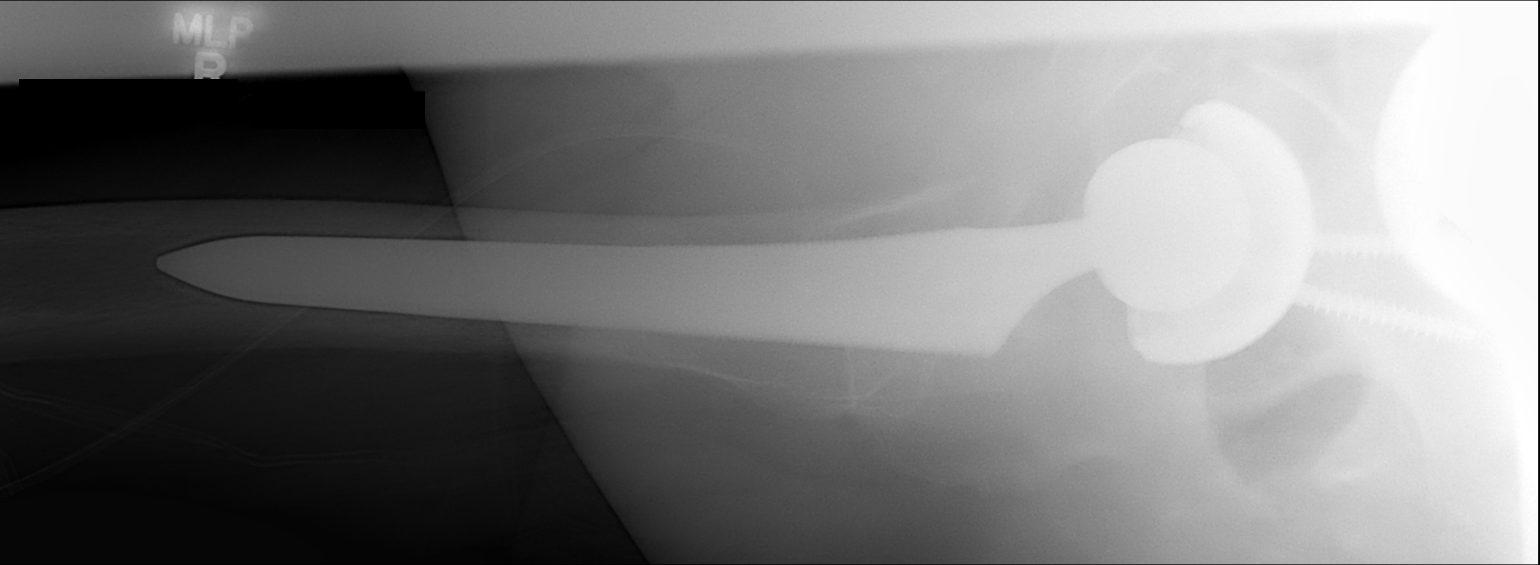

[1 of 1 positions shown; findings below may reference images not displayed]

FINDINGS: Post right total hip replacement. The femoral tip is not completely
delineated on the frontal view. On the lateral view, prominent
trabecular near the tip but without fracture identified.

Prior left total hip replacement without obvious complication.
IMPRESSION: Post right total hip replacement. The femoral tip is not completely
delineated on the frontal view. On the lateral view, prominent
trabecular near the tip but without fracture identified.

## 2013-11-28 ENCOUNTER — Ambulatory Visit (INDEPENDENT_AMBULATORY_CARE_PROVIDER_SITE_OTHER): Payer: Medicare Other | Admitting: *Deleted

## 2013-11-28 DIAGNOSIS — E291 Testicular hypofunction: Secondary | ICD-10-CM

## 2013-11-28 MED ORDER — TESTOSTERONE CYPIONATE 200 MG/ML IM SOLN
400.0000 mg | INTRAMUSCULAR | Status: DC
  Administered 2013-11-28: 400 mg via INTRAMUSCULAR

## 2013-11-28 MED ORDER — TESTOSTERONE CYPIONATE 100 MG/ML IM SOLN: 200.0000 mg | INTRAMUSCULAR | Status: DC

## 2013-11-28 NOTE — Progress Notes (Signed)
   Pt in clinic for testosterone cypionate 400 mg injection. Injection given Left Outer Upper Quadrant. Tolerated injection without difficultly.    Next appt 12/12/2013 at 2:30 PM.  Clovis PuMartin, Daina Cara L, RN

## 2013-12-07 ENCOUNTER — Other Ambulatory Visit: Payer: Self-pay | Admitting: Orthopedic Surgery

## 2013-12-07 DIAGNOSIS — M19079 Primary osteoarthritis, unspecified ankle and foot: Secondary | ICD-10-CM

## 2013-12-08 ENCOUNTER — Encounter (HOSPITAL_COMMUNITY)
Admission: EM | Disposition: A | Discharge status: Home / Self Care | Payer: Self-pay | Source: Home / Self Care | Attending: Emergency Medicine

## 2013-12-08 ENCOUNTER — Emergency Department (HOSPITAL_COMMUNITY): Payer: Medicare Other

## 2013-12-08 ENCOUNTER — Encounter (HOSPITAL_COMMUNITY): Payer: Self-pay | Admitting: Emergency Medicine

## 2013-12-08 ENCOUNTER — Emergency Department (HOSPITAL_COMMUNITY)
Admission: EM | Admit: 2013-12-08 | Discharge: 2013-12-08 | Disposition: A | Discharge status: Home / Self Care | Payer: Medicare Other | Attending: Emergency Medicine | Admitting: Emergency Medicine

## 2013-12-08 DIAGNOSIS — M129 Arthropathy, unspecified: Secondary | ICD-10-CM | POA: Insufficient documentation

## 2013-12-08 DIAGNOSIS — Z792 Long term (current) use of antibiotics: Secondary | ICD-10-CM | POA: Insufficient documentation

## 2013-12-08 DIAGNOSIS — R011 Cardiac murmur, unspecified: Secondary | ICD-10-CM | POA: Insufficient documentation

## 2013-12-08 DIAGNOSIS — Y939 Activity, unspecified: Secondary | ICD-10-CM | POA: Insufficient documentation

## 2013-12-08 DIAGNOSIS — I10 Essential (primary) hypertension: Secondary | ICD-10-CM | POA: Insufficient documentation

## 2013-12-08 DIAGNOSIS — Z8669 Personal history of other diseases of the nervous system and sense organs: Secondary | ICD-10-CM | POA: Insufficient documentation

## 2013-12-08 DIAGNOSIS — Z96649 Presence of unspecified artificial hip joint: Secondary | ICD-10-CM | POA: Insufficient documentation

## 2013-12-08 DIAGNOSIS — Z86718 Personal history of other venous thrombosis and embolism: Secondary | ICD-10-CM | POA: Insufficient documentation

## 2013-12-08 DIAGNOSIS — K219 Gastro-esophageal reflux disease without esophagitis: Secondary | ICD-10-CM | POA: Insufficient documentation

## 2013-12-08 DIAGNOSIS — Z79899 Other long term (current) drug therapy: Secondary | ICD-10-CM | POA: Insufficient documentation

## 2013-12-08 DIAGNOSIS — Z880 Allergy status to penicillin: Secondary | ICD-10-CM | POA: Insufficient documentation

## 2013-12-08 DIAGNOSIS — Z98811 Dental restoration status: Secondary | ICD-10-CM | POA: Insufficient documentation

## 2013-12-08 DIAGNOSIS — S73004A Unspecified dislocation of right hip, initial encounter: Secondary | ICD-10-CM

## 2013-12-08 DIAGNOSIS — Z87442 Personal history of urinary calculi: Secondary | ICD-10-CM | POA: Insufficient documentation

## 2013-12-08 DIAGNOSIS — F3289 Other specified depressive episodes: Secondary | ICD-10-CM | POA: Insufficient documentation

## 2013-12-08 DIAGNOSIS — X500XXA Overexertion from strenuous movement or load, initial encounter: Secondary | ICD-10-CM | POA: Insufficient documentation

## 2013-12-08 DIAGNOSIS — Z87891 Personal history of nicotine dependence: Secondary | ICD-10-CM | POA: Insufficient documentation

## 2013-12-08 DIAGNOSIS — R296 Repeated falls: Secondary | ICD-10-CM | POA: Insufficient documentation

## 2013-12-08 DIAGNOSIS — Z8781 Personal history of (healed) traumatic fracture: Secondary | ICD-10-CM | POA: Insufficient documentation

## 2013-12-08 DIAGNOSIS — T84029A Dislocation of unspecified internal joint prosthesis, initial encounter: Secondary | ICD-10-CM | POA: Insufficient documentation

## 2013-12-08 DIAGNOSIS — Z8673 Personal history of transient ischemic attack (TIA), and cerebral infarction without residual deficits: Secondary | ICD-10-CM | POA: Insufficient documentation

## 2013-12-08 DIAGNOSIS — F329 Major depressive disorder, single episode, unspecified: Secondary | ICD-10-CM | POA: Insufficient documentation

## 2013-12-08 DIAGNOSIS — Y929 Unspecified place or not applicable: Secondary | ICD-10-CM | POA: Insufficient documentation

## 2013-12-08 SURGERY — CLOSED MANIPULATION, JOINT, HIP
Anesthesia: General

## 2013-12-08 MED ORDER — PROPOFOL 10 MG/ML IV EMUL
INTRAVENOUS | Status: AC | PRN
  Administered 2013-12-08: 3 mL via INTRAVENOUS
  Administered 2013-12-08: 7.5 mL via INTRAVENOUS
  Administered 2013-12-08 (×2): 2.5 mL via INTRAVENOUS
  Administered 2013-12-08: 7.5 mL via INTRAVENOUS
  Administered 2013-12-08: 2 mL via INTRAVENOUS
  Administered 2013-12-08: 2.5 mL via INTRAVENOUS

## 2013-12-08 MED ORDER — MORPHINE SULFATE 4 MG/ML IJ SOLN
4.0000 mg | Freq: Once | INTRAMUSCULAR | Status: AC
  Administered 2013-12-08: 4 mg via INTRAVENOUS
  Filled 2013-12-08: qty 1

## 2013-12-08 MED ORDER — HYDROCODONE-ACETAMINOPHEN 5-325 MG PO TABS: 1.0000 | ORAL_TABLET | Freq: Four times a day (QID) | ORAL | Status: DC | PRN

## 2013-12-08 MED ORDER — PROPOFOL 10 MG/ML IV BOLUS
INTRAVENOUS | Status: AC
  Administered 2013-12-08: 75 mg via INTRAVENOUS
  Filled 2013-12-08: qty 1

## 2013-12-08 MED ORDER — PROPOFOL 10 MG/ML IV BOLUS
200.0000 mg | Freq: Once | INTRAVENOUS | Status: AC
  Administered 2013-12-08: 75 mg via INTRAVENOUS
  Filled 2013-12-08: qty 1

## 2013-12-08 NOTE — ED Provider Notes (Signed)
CSN: [id number]     Arrival date & time 12/08/13  1147 History   First MD Initiated Contact with Patient 12/08/13 1151     Chief Complaint  Patient presents with  . Hip Pain     (Consider location/radiation/quality/duration/timing/severity/associated sxs/prior Treatment) HPI  This a 53 year old male with history of hypertension, sleep apnea, and bilateral hip arthroplasty who presents with right hip pain. Patient states that last night he got from one of his tears and to his right foot down. He heard a pop. He then fell onto his right side. He denies hitting his head or losing consciousness.  He has not been ambulatory. He reports increasing pain. Currently his pain is rated 10 out of 10. Hip arthroplasty was done in 11/14 by Dr. Charlann Boxerlin. Patient denies any weakness, numbness, or tingling of the right extremity. He denies any other injury.  Past Medical History  Diagnosis Date  . Hypertension   . Depression   . GERD (gastroesophageal reflux disease)   . Heart murmur     has hole in his heart  . Sleep apnea     undiagnosed  . Kidney calculus   . Closed fracture of lateral malleolus 01/04/2008    Qualifier: History of  By: [name]  MD, [name]    . Complication of anesthesia     "problems with my breathing- states was told had sleep apnea, but not ever tested"  . Stroke 2004    patient has residual numbness  rt arm  . Arthritis   . Peripheral vascular disease     DVT 2004-  left leg  . Presence of IVC filter     right femoral   Past Surgical History  Procedure Laterality Date  . Total hip arthroplasty Bilateral     left x 2; right x 1  . Trochanteric bursa injection Left 03/01/13    Preferred Pain Management, Dr. Ardell Isaacsavid Spivey  . Joint replacement    . Ivc filter placement Right   . Total hip revision Right 08/14/2013    Procedure: RIGHT TOTAL HIP REVISION;  Surgeon: [name], MD;  Location: [facility] OR[facility];  Service: Orthopedics;  Laterality: Right;   No family history on  file. History  Substance Use Topics  . Smoking status: Former Smoker -- 0.50 packs/day for 16 years    Types: Cigarettes    Quit date: 07/16/2013  . Smokeless tobacco: Never Used  . Alcohol Use: No    Review of Systems  Constitutional: Negative.   Respiratory: Negative.  Negative for chest tightness and shortness of breath.   Cardiovascular: Negative.  Negative for chest pain.  Gastrointestinal: Negative.  Negative for abdominal pain.  Genitourinary: Negative.  Negative for dysuria.  Musculoskeletal: Negative for back pain and neck pain.       Right hip pain  Skin: Negative for wound.  Neurological: Negative for headaches.  All other systems reviewed and are negative.      Allergies  Ace inhibitors; Hctz; Lipitor; and Penicillins  Home Medications   Current Outpatient Rx  Name  Route  Sig  Dispense  Refill  . acetaminophen (TYLENOL) 325 MG tablet   Oral   Take 325 mg by mouth every 6 (six) hours as needed (pain).         [name]. amLODipine (NORVASC) 10 MG tablet   Oral   Take 10 mg by mouth daily.         . carvedilol (COREG) 25 MG tablet   Oral  Take 1 tablet (25 mg total) by mouth 2 (two) times daily with a meal.   60 tablet   11   . citalopram (CELEXA) 20 MG tablet   Oral   Take 20 mg by mouth 2 (two) times daily.         [name] doxazosin (CARDURA) 4 MG tablet   Oral   Take 4 mg by mouth at bedtime.         [name] doxycycline (VIBRAMYCIN) 100 MG capsule   Oral   Take 1 capsule (100 mg total) by mouth daily.   14 capsule   0   . hydrALAZINE (APRESOLINE) 25 MG tablet   Oral   Take 25 mg by mouth 3 (three) times daily.         [name] HYDROcodone-acetaminophen (NORCO) 7.5-325 MG per tablet   Oral   Take 1-2 tablets by mouth every 4 (four) hours.   100 tablet   0   . naphazoline (CLEAR EYES) 0.012 % ophthalmic solution   Both Eyes   Place 2 drops into both eyes daily.         . pantoprazole (PROTONIX) 40 MG tablet   Oral   Take 1 tablet (40 mg total)  by mouth daily.   31 tablet   5   . HYDROcodone-acetaminophen (NORCO/VICODIN) 5-325 MG per tablet   Oral   Take 1 tablet by mouth every 6 (six) hours as needed.   6 tablet   0   . testosterone cypionate (DEPOTESTOTERONE CYPIONATE) 200 MG/ML injection   Intramuscular   Inject 2 mLs (400 mg total) into the muscle every 14 (fourteen) days.   10 mL   5    BP 128/75  Pulse 70  Temp(Src) 98.4 F (36.9 C) (Oral)  Resp 16  SpO2 100% Physical Exam  Nursing note and vitals reviewed. Constitutional: He is oriented to person, place, and time. He appears well-developed and well-nourished. No distress.  HENT:  Head: Normocephalic and atraumatic.  Dentures  Eyes: Pupils are equal, round, and reactive to light.  Neck: Neck supple.  No midline C-spine tenderness  Cardiovascular: Normal rate, regular rhythm and normal heart sounds.   No murmur heard. Pulmonary/Chest: Effort normal and breath sounds normal. No respiratory distress. He has no wheezes.  Abdominal: Soft. Bowel sounds are normal. There is no tenderness. There is no rebound.  Musculoskeletal: He exhibits no edema.  Limited range of motion of the right lower extremity, noted to be slightly foreshortened, 2+ DP pulse  Neurological: He is alert and oriented to person, place, and time.  Skin: Skin is warm and dry.  Psychiatric: He has a normal mood and affect.    ED Course  Procedural sedation Date/Time: 12/09/2013 7:09 AM Performed by: Ross Marcus, F Authorized by: Ross Marcus, F Consent: Verbal consent obtained. written consent obtained. Risks and benefits: risks, benefits and alternatives were discussed Consent given by: patient Patient understanding: patient states understanding of the procedure being performed Patient consent: the patient's understanding of the procedure matches consent given Patient identity confirmed: verbally with patient Time out: Immediately prior to procedure a "time out" was called to  verify the correct patient, procedure, equipment, support staff and site/side marked as required. Local anesthesia used: no Patient sedated: yes Sedation type: moderate (conscious) sedation Sedatives: propofol Sedation start date/time: 12/08/2013 3:10 PM Sedation end date/time: 12/08/2013 3:30 PM Patient tolerance: Patient tolerated the procedure well with no immediate complications. Comments: Patient received a total of 275 mg of propofol  for closed reduction of the right hip   (including critical care time) Labs Review Labs Reviewed - No data to display Imaging Review Dg Hip Complete Right  12/08/2013   CLINICAL DATA:  Pain radiating down the right leg.  EXAM: RIGHT HIP - COMPLETE 2+ VIEW  COMPARISON:  08/14/2013  FINDINGS: Patient has bilateral total hip arthroplasties. There is dislocation of the right hip arthroplasty. The right femoral component is displaced superiorly and anteriorly. No evidence for a periprosthetic fracture. The left hip replacement is located. Pelvic bony ring is intact.  IMPRESSION: Dislocation of the right hip arthroplasty.   Electronically Signed   By: Richarda Overlie M.D.   On: 12/08/2013 12:50   Dg Pelvis Portable  12/08/2013   CLINICAL DATA:  Post hip reduction  EXAM: PORTABLE PELVIS 1-2 VIEWS  COMPARISON:  Prior film same day  FINDINGS: Two frontal view of the pelvis submitted. Postreduction there is normal alignment of right hip prosthesis. Left hip prosthesis in normal alignment.  IMPRESSION: Postreduction bilateral hip prosthesis in anatomic alignment.   Electronically Signed   By: Natasha Mead M.D.   On: 12/08/2013 16:14     EKG Interpretation None      MDM   Final diagnoses:  Hip dislocation, right    Patient presents with right-sided hip pain. Exam notable for right leg without neurovascular compromise. X-rays show dislocation of the right hip prosthesis. Discuss with Dr. Charlann Boxer.  Patient was consented and prepped for sedation. See details above. It was  successfully reduced at the bedside with good neurovascular exam postreduction. Patient will followup in the office in approximately 2 weeks.  After history, exam, and medical workup I feel the patient has been appropriately medically screened and is safe for discharge home. Pertinent diagnoses were discussed with the patient. Patient was given return precautions.     Shon Baton, MD 12/09/13 [id number]

## 2013-12-08 NOTE — ED Notes (Addendum)
Pt presents with c/o right hip pain. Pt has had multiple hip surgeries on both sides. Pt's most recent hip surgery was on the right side 11/14. Per EMS, pt says he has been going to physical therapy, pt was walking in his home yesterday and when he put his right foot down he felt a pop and then he fell. Fell from standing position onto carpeted floor. Pt was given 200 fentanyl en route by EMS

## 2013-12-08 NOTE — ED Notes (Signed)
MD at bedside. 

## 2013-12-08 NOTE — ED Notes (Signed)
Patient transported to X-ray 

## 2013-12-08 NOTE — ED Notes (Signed)
Pt eating crackers and drinking water. Pt A&Ox4.

## 2013-12-08 NOTE — ED Notes (Signed)
Bed: WA21 Expected date:  Expected time:  Means of arrival:  Comments: EMS fall hip pain 

## 2013-12-08 NOTE — Consult Note (Signed)
Reason for Consult: Dislocated right total hip arthroplasty Referring Physician:  [name], EDP  Jeffrey [name] is an 53 y.o. male.  HPI:  Pt presented to the ER today complaining of right hip pain. Pt has had multiple hip surgeries on both sides. Pt's most recent hip surgery was a right hip revision preformed by Dr. Charlann Boxerlin on 11/14. He states that he is unsure of why the hip popped out, he was just walking around in his house when he felt a pop.  HE subsequently fell from standing position onto carpeted floor.  He then had EMS bring him to the ER when he was still having issues with pain and moving the leg this morning. ER physician consulted Dr. Charlann Boxerlin   Past Medical History  Diagnosis Date  . Hypertension   . Depression   . GERD (gastroesophageal reflux disease)   . Heart murmur     has hole in his heart  . Sleep apnea     undiagnosed  . Kidney calculus   . Closed fracture of lateral malleolus 01/04/2008    Qualifier: History of  By: [name]  MD, [name]    . Complication of anesthesia     "problems with my breathing- states was told had sleep apnea, but not ever tested"  . Stroke 2004    patient has residual numbness  rt arm  . Arthritis   . Peripheral vascular disease     DVT 2004-  left leg  . Presence of IVC filter     right femoral    Past Surgical History  Procedure Laterality Date  . Total hip arthroplasty Bilateral     left x 2; right x 1  . Trochanteric bursa injection Left 03/01/13    Preferred Pain Management, Dr. Ardell Isaacsavid Spivey  . Joint replacement    . Ivc filter placement Right   . Total hip revision Right 08/14/2013    Procedure: RIGHT TOTAL HIP REVISION;  Surgeon: [name], MD;  Location: [facility];  Service: Orthopedics;  Laterality: Right;    No family history on file.  Social History:  reports that he quit smoking about 4 months ago. His smoking use included Cigarettes. He has a 8 pack-year smoking history. He has never used smokeless tobacco. He reports  that he does not drink alcohol or use illicit drugs.  Allergies:  Allergies  Allergen Reactions  . Ace Inhibitors     Possible ACEi induced renal failure.   [name]. Hctz [Hydrochlorothiazide]     Thiazide induced hypercalcemia   . Lipitor [Atorvastatin] Nausea And Vomiting  . Penicillins     REACTION: hives     Dg Hip Complete Right  12/08/2013   CLINICAL DATA:  Pain radiating down the right leg.  EXAM: RIGHT HIP - COMPLETE 2+ VIEW  COMPARISON:  08/14/2013  FINDINGS: Patient has bilateral total hip arthroplasties. There is dislocation of the right hip arthroplasty. The right femoral component is displaced superiorly and anteriorly. No evidence for a periprosthetic fracture. The left hip replacement is located. Pelvic bony ring is intact.  IMPRESSION: Dislocation of the right hip arthroplasty.   Electronically Signed   By: Richarda OverlieAdam  Henn M.D.   On: 12/08/2013 12:50    Review of Systems  HENT: Negative.   Eyes: Negative.   Respiratory: Negative.   Cardiovascular: Negative.   Gastrointestinal: Positive for heartburn.  Musculoskeletal: Positive for joint pain.  Skin: Negative.   Neurological: Positive for weakness.  Endo/Heme/Allergies: Negative.   Psychiatric/Behavioral: Positive  for depression.   Blood pressure 131/75, pulse 74, temperature 98.3 F (36.8 C), temperature source Oral, resp. rate 20, SpO2 100.00%. Physical Exam  Constitutional: He is oriented to person, place, and time. He appears well-developed and well-nourished.  HENT:  Head: Normocephalic and atraumatic.  Eyes: Pupils are equal, round, and reactive to light.  Neck: Neck supple. No JVD present. No tracheal deviation present. No thyromegaly present.  Cardiovascular: Normal rate, regular rhythm and intact distal pulses.   Respiratory: Effort normal and breath sounds normal. No respiratory distress. He has no wheezes.  GI: Soft. There is no tenderness. There is no guarding.  Musculoskeletal:       Right hip: He exhibits  decreased range of motion, decreased strength, tenderness, bony tenderness, swelling and deformity (external rotation).  Neurological: He is alert and oriented to person, place, and time.  Skin: Skin is warm and dry.  Psychiatric: He has a normal mood and affect.    Assessment/Plan: Anterior dislocated right total hip arthroplasty  Consent was signed by the patient for Dr. Charlann Boxer to preform a reduction of the right hip under sedation.  Dr. Wilkie Aye provided the sedation while in the ER with close monitoring of the patient at all times. Once the sedation had effectively relaxed the patient Dr. Charlann Boxer was able to provide proper pressure to reduce the right prosthetic hip. [name], [name] in assisting in the reduction of the hip.  Once the hip was reduced a portable x-ray imagine revealed that the hip was in proper position. Patient quickly returned to pre-sedation state. Have discussed with the patient that there is no need for a knee immobilizer and that he should take it easy on the hip until he has a follow up in the clinic.  We will have him follow at [facility] in 3 weeks.     Jeffrey [name] 12/08/2013, 4:06 PM

## 2013-12-08 NOTE — Discharge Instructions (Signed)
Hip Dislocation  Hip dislocation is the displacement of the "ball" at the head of your thigh bone (femur) from its socket in the hip bone (pelvis). The ball-and-socket structure of the hip joint gives it a lot of stability, while allowing it to move freely. Therefore, a lot of force is required to displace the femur from its socket. A hip dislocation is an emergency. If you believe you have dislocated your hip and cannot move your leg, call for help immediately. Do not try to move.  CAUSES  The most common cause of hip dislocation is motor vehicle accidents. However, force from falls from a height (a ladder or building), injuries from contact sports, or injuries from industrial accidents can be enough to dislocate your hip.  SYMPTOMS  A hip dislocation is very painful. If you have a dislocated hip, you will not be able to move your hip. If you have nerve damage, you may not have feeling in your lower leg, foot, or ankle.   DIAGNOSIS  Usually, your caregiver can diagnose a hip dislocation by looking at the position of your leg. Generally, X-ray exams are done to check for fractures in your femur or pelvis. The leg of the dislocated hip will appear shorter than the other leg, and your foot will be turned inward.  TREATMENT   Your caregiver can manipulate your bones back into the joint (reduction). If there are no other complications involved with your dislocation, such as fractures or damage to blood vessels or nerves, this procedure can be done without surgery. Before this procedure, you will be given medicine so that you will not feel pain (anesthetic). Often specialized imaging exams are done after the reduction (magnetic resonance imaging [MRI] or computed tomography [CT]) to check for loose pieces of cartilage or bone in the joint.  If a manual reduction fails or you have nerve damage, damage to your blood vessels, or bone fractures, surgery will be necessary to perform the reduction.   HOME CARE  INSTRUCTIONS  The following measures can help to reduce pain and speed up the healing process:  · Rest your injured joint. Do not move your joint if it is painful. Also, avoid activities similar to the one that caused your injury.  · Apply ice to your injured joint for 1 to 2 days after your reduction or as directed by your caregiver. Applying ice helps to reduce inflammation and pain.  · Put ice in a plastic bag.  · Place a towel between your skin and the bag.  · Leave the ice on for 15 to 20 minutes at a time, every 2 hours while you are awake.  · Use crutches or a walker as directed by your caregiver.  · Exercise your hip and leg as directed by your caregiver.  · Take over-the-counter or prescription medicine for pain as directed by your caregiver.  SEEK IMMEDIATE MEDICAL CARE IF:  · Your pain becomes worse rather than better.  · You feel like your hip has become dislocated again.  MAKE SURE YOU:  · Understand these instructions.  · Will watch your condition.  · Will get help right away if you are not doing well or get worse.  Document Released: 06/16/2001 Document Revised: 12/14/2011 Document Reviewed: 02/19/2011  [employer]® Patient Information ©2014 [employer].

## 2013-12-08 NOTE — ED Notes (Signed)
Staff present in room for right hip prosthetic reduction; timeout completed, pt given 275 mg or propofol IV push by [name] throughout procedure; documented in sedation narrator. Pt now alert and oriented after procedure and able to answer all questions

## 2013-12-08 NOTE — ED Notes (Signed)
Pt presents with c/o right hip pain after a fall that occurred yesterday. Pt has hx of several hip surgeries.

## 2013-12-11 ENCOUNTER — Other Ambulatory Visit: Payer: Self-pay | Admitting: Family Medicine

## 2013-12-11 NOTE — Op Note (Signed)
NAMSherilyn Banker:  Blum, Chon                ACCOUNT NO.:  [id number]  MEDICAL RECORD NO.:  [id number]  LOCATION:                                 FACILITY:  PHYSICIAN:  Madlyn FrankelMatthew D. Charlann Boxerlin, M.D.  DATE OF BIRTH:  [birth year]  DATE OF PROCEDURE:  12/08/2013 DATE OF DISCHARGE:                              OPERATIVE REPORT   PREOPERATIVE DIAGNOSIS:  Dislocated right total hip replacement.  POSTOPERATIVE DIAGNOSIS:  Anterior dislocation, right hip.  PROCEDURE:  Closed reduction of his right hip performed in the emergency room under propofol anesthetic, monitored by the emergency room physicians.  SURGEON:  Madlyn FrankelMatthew D. Charlann Boxerlin, M.D.  ASSISTANT:  Lanney GinsMatthew Babish, PA-C.  Note that Mr. Carmon SailsBabish was very important for this procedure with regard to maintenance of his pelvis due to the effort required to pull back in the place.  COMPLICATIONS:  None.  INDICATIONS FOR PROCEDURE:  Mr. Jeffrey EdisCollins is a 53 year old male, now about 4 months out from a revision of right hip due to failed metal-on-metal hip replacement.  This was performed through traditional approach posteriorly.  He had stated upon evaluation in the emergency room, that he had been doing very well, that this is a freakish episode that he cannot recall exactly what had happened.  He felt a pop sensation and fell.  He presented to the emergency room via EMS because he was unable to flex his hip, bear weight.  When he was seen and evaluated in the emergency room, they recognized an anterior hip dislocation and I was consulted and called.  When he was seen and evaluated in the emergency room, found to be neurovascularly intact to the leg that was shortened and externally rotated.  He had pain with any movement and this was thus avoided.  After reviewing his current situation, we obtained consent for a closed reduction attempt in the emergency room.  After reviewing the possibility need to go to the operating room to have it reduced.  PROCEDURE IN  DETAIL:  Once propofol anesthetic has been established and monitored airway through IV with emergency room physician, Lanney GinsMatthew Babish applied  pressure on his pelvis.  Though the anesthetic was not 100% in terms of complete relaxation after probably 10-15 minutes of applying traction and external rotation to disengage the femoral head, I was able to palpably feel the hip reduced with internal rotation.  Leg lengths were restored.  Hip range of motion was significantly improved.  Following the procedure, an AP pelvis confirmed reduced hip.  He was subsequently discharged from the emergency room.  He will plan to see me back in 3 weeks for radiographs to make sure he is doing well. Postop instructions were to avoid any extension, external rotation. Review of his postoperative radiographs make it very hard for me to understand why does he would pop out anteriorly as his acetabular cup anteversion does not appear to be excessive.  We will continue to monitor this with hopes.  This is an early event in that his hip had been replaced within three months.  Given the difficulties in reduction of the hip indicating that he has enough muscle stability around his hip to maintain his hip  in his current orientation.     Madlyn Frankel Charlann Boxer, M.D.     MDO/MEDQ  D:  12/09/2013  T:  12/09/2013  Job:  [id number]

## 2013-12-12 ENCOUNTER — Ambulatory Visit (INDEPENDENT_AMBULATORY_CARE_PROVIDER_SITE_OTHER): Payer: Medicare Other | Admitting: *Deleted

## 2013-12-12 DIAGNOSIS — E291 Testicular hypofunction: Secondary | ICD-10-CM

## 2013-12-12 MED ORDER — TESTOSTERONE CYPIONATE 200 MG/ML IM SOLN: 400.0000 mg | INTRAMUSCULAR | Status: DC

## 2013-12-12 MED ORDER — TESTOSTERONE CYPIONATE 200 MG/ML IM SOLN
400.0000 mg | INTRAMUSCULAR | Status: DC
  Administered 2013-12-12: 400 mg via INTRAMUSCULAR

## 2013-12-12 NOTE — Progress Notes (Signed)
   Pt in for testosterone injection.  Injection given LUOQ.  Next appt 12/26/2013 at 2:30 PM.  Clovis PuMartin, Tamika L, RN

## 2013-12-13 ENCOUNTER — Ambulatory Visit
Admission: RE | Admit: 2013-12-13 | Discharge: 2013-12-13 | Disposition: A | Discharge status: Home / Self Care | Payer: Medicare Other | Source: Ambulatory Visit | Attending: Orthopedic Surgery | Admitting: Orthopedic Surgery

## 2013-12-13 DIAGNOSIS — M19079 Primary osteoarthritis, unspecified ankle and foot: Secondary | ICD-10-CM

## 2013-12-26 ENCOUNTER — Other Ambulatory Visit: Payer: Self-pay | Admitting: *Deleted

## 2013-12-26 ENCOUNTER — Ambulatory Visit (INDEPENDENT_AMBULATORY_CARE_PROVIDER_SITE_OTHER): Payer: Medicare Other | Admitting: *Deleted

## 2013-12-26 DIAGNOSIS — E291 Testicular hypofunction: Secondary | ICD-10-CM

## 2013-12-26 DIAGNOSIS — E349 Endocrine disorder, unspecified: Secondary | ICD-10-CM

## 2013-12-26 MED ORDER — TESTOSTERONE CYPIONATE 200 MG/ML IM SOLN
400.0000 mg | INTRAMUSCULAR | Status: DC
  Administered 2013-12-26: 400 mg via INTRAMUSCULAR

## 2013-12-26 NOTE — Progress Notes (Signed)
   Pt in clinic for testosterone injection.  400 mg testosterone cypionate IM given in RUOQ.  Next appt 01/09/2014 at 2:30 pm.

## 2013-12-27 ENCOUNTER — Other Ambulatory Visit: Payer: Self-pay | Admitting: Family Medicine

## 2013-12-27 DIAGNOSIS — E349 Endocrine disorder, unspecified: Secondary | ICD-10-CM

## 2013-12-27 MED ORDER — TESTOSTERONE CYPIONATE 200 MG/ML IM SOLN: 400.0000 mg | INTRAMUSCULAR | Status: DC

## 2014-01-09 ENCOUNTER — Ambulatory Visit (INDEPENDENT_AMBULATORY_CARE_PROVIDER_SITE_OTHER): Payer: Medicare Other | Admitting: *Deleted

## 2014-01-09 DIAGNOSIS — E291 Testicular hypofunction: Secondary | ICD-10-CM

## 2014-01-09 MED ORDER — TESTOSTERONE CYPIONATE 200 MG/ML IM SOLN
400.0000 mg | INTRAMUSCULAR | Status: DC
  Administered 2014-01-09: 400 mg via INTRAMUSCULAR

## 2014-01-09 NOTE — Progress Notes (Signed)
   Pt in for testosterone 400 mg IM injection. 2 ML given Left Outer Upper Quadrant without difficult.  Appt 01/23/2014 for next injection. Clovis PuMartin, Tamika L, RN

## 2014-01-11 ENCOUNTER — Telehealth: Payer: Self-pay | Admitting: *Deleted

## 2014-01-11 NOTE — Telephone Encounter (Signed)
Left voice message for pt to call and schedule appt with Dr. Clinton SawyerWilliamson for follow up on testosterone levels.  Clovis Puamika L Charmayne Odell, RN

## 2014-01-16 ENCOUNTER — Other Ambulatory Visit: Payer: Self-pay | Admitting: Family Medicine

## 2014-01-16 NOTE — Telephone Encounter (Signed)
I am covering Dr. Yetta NumbersWilliamson's inbox. No indication for doxycycline refill. If patient wants to discuss this, he can make appointment in clinic. Will refill celexa 20mg  bid.

## 2014-01-17 ENCOUNTER — Telehealth: Payer: Self-pay | Admitting: *Deleted

## 2014-01-17 DIAGNOSIS — F329 Major depressive disorder, single episode, unspecified: Secondary | ICD-10-CM

## 2014-01-17 DIAGNOSIS — F32A Depression, unspecified: Secondary | ICD-10-CM

## 2014-01-17 MED ORDER — CITALOPRAM HYDROBROMIDE 40 MG PO TABS: 40.0000 mg | ORAL_TABLET | Freq: Every day | ORAL | Status: DC

## 2014-01-17 NOTE — Telephone Encounter (Signed)
Prescription changed to 40 mg once daily to comply with quantity limit while maintaining the same daily dose. New rx sent electronically to the pharmacy.

## 2014-01-17 NOTE — Telephone Encounter (Signed)
Prior Authorization for Citlopram 20 mg tablet from [employer] [facility].  There is a quantity limit of 30 tab per 30 days.  Rx written for 20 mg 1 tab by mouth twice daily # 60.  Pharmacy ran Rx for 40 mg tab once daily for 30 days and received a paid claim.  Form placed in provider for review.  Clovis Puamika L Martin, RN

## 2014-01-22 ENCOUNTER — Ambulatory Visit: Payer: Medicare Other | Admitting: Family Medicine

## 2014-01-23 ENCOUNTER — Ambulatory Visit: Payer: Medicare Other

## 2014-01-31 ENCOUNTER — Encounter: Payer: Self-pay | Admitting: Family Medicine

## 2014-01-31 ENCOUNTER — Ambulatory Visit (INDEPENDENT_AMBULATORY_CARE_PROVIDER_SITE_OTHER): Payer: Medicare Other | Admitting: Family Medicine

## 2014-01-31 VITALS — BP 137/86 | HR 103 | Ht 71.0 in | Wt 221.0 lb

## 2014-01-31 DIAGNOSIS — S82899A Other fracture of unspecified lower leg, initial encounter for closed fracture: Secondary | ICD-10-CM

## 2014-01-31 DIAGNOSIS — D649 Anemia, unspecified: Secondary | ICD-10-CM

## 2014-01-31 DIAGNOSIS — R739 Hyperglycemia, unspecified: Secondary | ICD-10-CM

## 2014-01-31 DIAGNOSIS — S82892A Other fracture of left lower leg, initial encounter for closed fracture: Secondary | ICD-10-CM

## 2014-01-31 DIAGNOSIS — I10 Essential (primary) hypertension: Secondary | ICD-10-CM

## 2014-01-31 DIAGNOSIS — R7309 Other abnormal glucose: Secondary | ICD-10-CM

## 2014-01-31 DIAGNOSIS — E291 Testicular hypofunction: Secondary | ICD-10-CM

## 2014-01-31 LAB — COMPREHENSIVE METABOLIC PANEL
ALBUMIN: 4.3 g/dL (ref 3.5–5.2)
ALT: 18 U/L (ref 0–53)
AST: 20 U/L (ref 0–37)
Alkaline Phosphatase: 70 U/L (ref 39–117)
BUN: 18 mg/dL (ref 6–23)
CALCIUM: 9.6 mg/dL (ref 8.4–10.5)
CHLORIDE: 104 meq/L (ref 96–112)
CO2: 27 meq/L (ref 19–32)
Creat: 1.08 mg/dL (ref 0.50–1.35)
GLUCOSE: 95 mg/dL (ref 70–99)
Potassium: 3.7 mEq/L (ref 3.5–5.3)
SODIUM: 140 meq/L (ref 135–145)
Total Bilirubin: 0.3 mg/dL (ref 0.2–1.2)
Total Protein: 6.6 g/dL (ref 6.0–8.3)

## 2014-01-31 LAB — CBC
HCT: 45.7 % (ref 39.0–52.0)
HEMOGLOBIN: 15.6 g/dL (ref 13.0–17.0)
MCH: 30.9 pg (ref 26.0–34.0)
MCHC: 34.1 g/dL (ref 30.0–36.0)
MCV: 90.5 fL (ref 78.0–100.0)
Platelets: 232 10*3/uL (ref 150–400)
RBC: 5.05 MIL/uL (ref 4.22–5.81)
RDW: 15.4 % (ref 11.5–15.5)
WBC: 12.2 10*3/uL — AB (ref 4.0–10.5)

## 2014-01-31 LAB — POCT GLYCOSYLATED HEMOGLOBIN (HGB A1C): Hemoglobin A1C: 4.7

## 2014-01-31 LAB — IBC PANEL
%SAT: 11 % — ABNORMAL LOW (ref 20–55)
TIBC: 374 ug/dL (ref 215–435)
UIBC: 334 ug/dL (ref 125–400)

## 2014-01-31 LAB — IRON: Iron: 40 ug/dL — ABNORMAL LOW (ref 42–165)

## 2014-01-31 MED ORDER — TESTOSTERONE CYPIONATE 200 MG/ML IM SOLN
400.0000 mg | INTRAMUSCULAR | Status: DC
  Administered 2014-01-31: 400 mg via INTRAMUSCULAR

## 2014-01-31 MED ORDER — IBUPROFEN 800 MG PO TABS: 800.0000 mg | ORAL_TABLET | Freq: Three times a day (TID) | ORAL | Status: DC | PRN

## 2014-01-31 NOTE — Progress Notes (Signed)
   Subjective:    Patient ID: Jeffrey [name], male    DOB: 10/15/1960, 53 y.o.   MRN: [id number]  HPI  53 year old M with tesosterone deficiency, chronic pain from AVN of the femoral head,   1. Testosterone deficiency - Chronic issue; Presumed due to primary hypogonadism but pt has refused previous evaluation of genitals. Taking testosterone cypionate 400 mg IM every 2 weeks. No negative side effects. Pt does feel stronger and more energized.   2. Hypertension -   Office BP: BP Readings from Last 3 Encounters:  01/31/14 137/86  12/08/13 128/75  12/08/13 128/75    Prescribed meds:   Hypertension ROS:  Taking medications as prescribed:Yes Chest pain: No Shortness of breath: No Swelling of extremities: No TIA symptoms: No Regular exercise: No - pt with severe orthopedic injuries including chronic left ankle fracture  Low Na+ diet: No Alcohol/tobacco/drug use: No  3. Cancer Screening - pt due for colonoscopy; denies constipation or blood in stool  Current Outpatient Prescriptions on File Prior to Visit  Medication Sig Dispense Refill  . acetaminophen (TYLENOL) 325 MG tablet Take 325 mg by mouth every 6 (six) hours as needed (pain).      [name]. amLODipine (NORVASC) 10 MG tablet Take 10 mg by mouth daily.      . carvedilol (COREG) 25 MG tablet Take 1 tablet (25 mg total) by mouth 2 (two) times daily with a meal.  60 tablet  11  . citalopram (CELEXA) 40 MG tablet Take 1 tablet (40 mg total) by mouth daily.  30 tablet  3  . doxazosin (CARDURA) 4 MG tablet Take 4 mg by mouth at bedtime.      [name]. doxazosin (CARDURA) 4 MG tablet TAKE 1 TABLET (4 MG TOTAL) BY MOUTH AT BEDTIME.  30 tablet  11  . doxycycline (VIBRAMYCIN) 100 MG capsule Take 1 capsule (100 mg total) by mouth daily.  14 capsule  0  . hydrALAZINE (APRESOLINE) 25 MG tablet Take 25 mg by mouth 3 (three) times daily.      [name]. HYDROcodone-acetaminophen (NORCO) 7.5-325 MG per tablet Take 1-2 tablets by mouth every 4 (four) hours.  100  tablet  0  . HYDROcodone-acetaminophen (NORCO/VICODIN) 5-325 MG per tablet Take 1 tablet by mouth every 6 (six) hours as needed.  6 tablet  0  . naphazoline (CLEAR EYES) 0.012 % ophthalmic solution Place 2 drops into both eyes daily.      . pantoprazole (PROTONIX) 40 MG tablet Take 1 tablet (40 mg total) by mouth daily.  31 tablet  5  . testosterone cypionate (DEPOTESTOTERONE CYPIONATE) 200 MG/ML injection Inject 2 mLs (400 mg total) into the muscle every 14 (fourteen) days.  10 mL  5   No current facility-administered medications on file prior to visit.     Review of Systems Positive for left ankle pain Otherwise see HPI    Objective:   Physical Exam BP 137/86  Pulse 103  Ht 5\' 11"  (1.803 m)  Wt 221 lb (100.245 kg)  BMI 30.84 kg/m2  Gen: well appearing, non distressed, pleasant and conversant  CV: RRR, no murmurs  Pulm: CTA-B, normal WOB Skin: warm, dry, no flushing MSK: left ankle with diffuse tenderness and swelling, decreased ROM       Assessment & Plan:

## 2014-01-31 NOTE — Patient Instructions (Addendum)
Mr. Thomasena EdisCollins,   [place]. You look like you are doing well. I am going to check a lot of lab work today and will let you know the results.   Please continue your current medications.   For cancer screening, please get a colonoscopy.    Come back in 3 months or sooner if needed.   Sincerely,   Dr. Clinton SawyerWilliamson

## 2014-02-01 LAB — FERRITIN: FERRITIN: 32 ng/mL (ref 22–322)

## 2014-02-01 LAB — TESTOSTERONE: TESTOSTERONE: 190 ng/dL — AB (ref 300–890)

## 2014-02-01 NOTE — Assessment & Plan Note (Signed)
A: symptomatically better P: check testosterone level today

## 2014-02-01 NOTE — Assessment & Plan Note (Signed)
A: stable P: check renal function and fe today

## 2014-02-06 ENCOUNTER — Encounter: Payer: Self-pay | Admitting: Family Medicine

## 2014-02-07 ENCOUNTER — Telehealth: Payer: Self-pay | Admitting: Family Medicine

## 2014-02-07 ENCOUNTER — Encounter: Payer: Self-pay | Admitting: Family Medicine

## 2014-02-07 NOTE — Telephone Encounter (Signed)
Left message stating the labs were WNL except low Fe and low testosterone. I told him that I did not think that it would be helpful to treat his low iron or change his testosterone supplementation at this time. I will send a letter stating the same.

## 2014-02-14 ENCOUNTER — Ambulatory Visit (INDEPENDENT_AMBULATORY_CARE_PROVIDER_SITE_OTHER): Payer: Medicare Other | Admitting: *Deleted

## 2014-02-14 DIAGNOSIS — E291 Testicular hypofunction: Secondary | ICD-10-CM

## 2014-02-14 MED ORDER — TESTOSTERONE CYPIONATE 200 MG/ML IM SOLN
400.0000 mg | INTRAMUSCULAR | Status: DC
  Administered 2014-02-14: 400 mg via INTRAMUSCULAR

## 2014-02-14 NOTE — Progress Notes (Signed)
   Pt in clinic for testosterone injection.  Testosterone cypionate 2 ML given Left Upper Outer Quadrant.  Next appt 02/28/2014 at 2:30 PM.  Pt tolerated injection, site unremarkable.  Clovis Puamika L Sheffield Hawker, RN

## 2014-02-16 ENCOUNTER — Other Ambulatory Visit: Payer: Self-pay | Admitting: Family Medicine

## 2014-02-21 ENCOUNTER — Other Ambulatory Visit: Payer: Self-pay | Admitting: Family Medicine

## 2014-02-28 ENCOUNTER — Ambulatory Visit (INDEPENDENT_AMBULATORY_CARE_PROVIDER_SITE_OTHER): Payer: Medicare Other | Admitting: *Deleted

## 2014-02-28 DIAGNOSIS — E291 Testicular hypofunction: Secondary | ICD-10-CM

## 2014-02-28 MED ORDER — TESTOSTERONE CYPIONATE 200 MG/ML IM SOLN
400.0000 mg | INTRAMUSCULAR | Status: DC
  Administered 2014-02-28: 400 mg via INTRAMUSCULAR

## 2014-02-28 NOTE — Progress Notes (Signed)
   Pt in for testosterone cypionate injection.   Testosterone cypionate 2 mL given RUOQ.  Next appt 03/14/2014 at 2:30 PM.  Pt denies any other symptoms today.  Clovis Pu, RN

## 2014-03-14 ENCOUNTER — Ambulatory Visit: Payer: Medicare Other

## 2014-03-15 ENCOUNTER — Ambulatory Visit (INDEPENDENT_AMBULATORY_CARE_PROVIDER_SITE_OTHER): Payer: Medicare Other | Admitting: *Deleted

## 2014-03-15 DIAGNOSIS — E349 Endocrine disorder, unspecified: Secondary | ICD-10-CM

## 2014-03-15 DIAGNOSIS — E291 Testicular hypofunction: Secondary | ICD-10-CM

## 2014-03-15 MED ORDER — TESTOSTERONE CYPIONATE 200 MG/ML IM SOLN
400.0000 mg | Freq: Once | INTRAMUSCULAR | Status: AC
  Administered 2014-03-15: 400 mg via INTRAMUSCULAR

## 2014-03-15 NOTE — Progress Notes (Signed)
Pt in for testosterone cypionate injection. Testosterone cypionate 2 mL given LUOQ.  Site unremarkable and patient tolerated injection.  Pt denies any other symptoms today.  Next appt 03/28/2014 at 2:30 PM.  Altamese Dilling, BSN, RN-BC

## 2014-03-29 ENCOUNTER — Other Ambulatory Visit: Payer: Self-pay | Admitting: *Deleted

## 2014-03-29 ENCOUNTER — Ambulatory Visit (INDEPENDENT_AMBULATORY_CARE_PROVIDER_SITE_OTHER): Payer: Medicare Other | Admitting: *Deleted

## 2014-03-29 DIAGNOSIS — E291 Testicular hypofunction: Secondary | ICD-10-CM

## 2014-03-29 DIAGNOSIS — S82892A Other fracture of left lower leg, initial encounter for closed fracture: Secondary | ICD-10-CM

## 2014-03-29 MED ORDER — TESTOSTERONE CYPIONATE 200 MG/ML IM SOLN
400.0000 mg | INTRAMUSCULAR | Status: DC
  Administered 2014-03-29: 400 mg via INTRAMUSCULAR

## 2014-03-29 MED ORDER — IBUPROFEN 800 MG PO TABS: 800.0000 mg | ORAL_TABLET | Freq: Three times a day (TID) | ORAL | Status: DC | PRN

## 2014-03-29 NOTE — Progress Notes (Signed)
   Pt in for testosterone cypionate injection. Testosterone cypionate 2 mL given RUOQ. Next appt 04/11/2014 at 2:30 PM. Pt denies any other symptoms today. Clovis Puamika L Martin, RN

## 2014-04-11 ENCOUNTER — Other Ambulatory Visit: Payer: Self-pay | Admitting: *Deleted

## 2014-04-11 ENCOUNTER — Ambulatory Visit (INDEPENDENT_AMBULATORY_CARE_PROVIDER_SITE_OTHER): Payer: Medicare Other | Admitting: *Deleted

## 2014-04-11 DIAGNOSIS — E349 Endocrine disorder, unspecified: Secondary | ICD-10-CM

## 2014-04-11 DIAGNOSIS — E291 Testicular hypofunction: Secondary | ICD-10-CM

## 2014-04-11 MED ORDER — TESTOSTERONE CYPIONATE 200 MG/ML IM SOLN
400.0000 mg | INTRAMUSCULAR | Status: DC
  Administered 2014-04-11: 400 mg via INTRAMUSCULAR

## 2014-04-11 NOTE — Progress Notes (Signed)
   Pt in nurse clinic for testosterone cypionate injection.  Testosterone 2 ml given LUOQ given.  Next appt 04/25/2014 at 2:30 PM.  Clovis PuMartin, Branna Cortina L, RN

## 2014-04-12 MED ORDER — TESTOSTERONE CYPIONATE 200 MG/ML IM SOLN
400.0000 mg | INTRAMUSCULAR | Status: DC
Start: ? — End: 2014-08-02

## 2014-04-16 ENCOUNTER — Telehealth: Payer: Self-pay | Admitting: Family Medicine

## 2014-04-16 NOTE — Telephone Encounter (Signed)
Refill for testosterone filled and available for pickup at the office.

## 2014-04-17 NOTE — Telephone Encounter (Signed)
LVM for patient to call back office 

## 2014-04-17 NOTE — Telephone Encounter (Signed)
Spoke with patient and he has already picked it up

## 2014-04-25 ENCOUNTER — Ambulatory Visit (INDEPENDENT_AMBULATORY_CARE_PROVIDER_SITE_OTHER): Payer: Medicare Other | Admitting: *Deleted

## 2014-04-25 DIAGNOSIS — E291 Testicular hypofunction: Secondary | ICD-10-CM

## 2014-04-25 MED ORDER — TESTOSTERONE CYPIONATE 200 MG/ML IM SOLN
400.0000 mg | INTRAMUSCULAR | Status: DC
  Administered 2014-04-25: 400 mg via INTRAMUSCULAR

## 2014-04-25 NOTE — Progress Notes (Signed)
  Pt in nurse clinic for testosterone cypionate injection. Testosterone 2 ml given RUOQ given. Next appt 05/11/2014 at 2:30 PM. Clovis PuMartin, Tamika L, RN

## 2014-05-11 ENCOUNTER — Ambulatory Visit: Payer: Medicare Other

## 2014-05-16 ENCOUNTER — Ambulatory Visit: Payer: Medicare Other

## 2014-05-17 ENCOUNTER — Encounter: Payer: Self-pay | Admitting: *Deleted

## 2014-05-17 NOTE — Progress Notes (Signed)
Prior Authorization for testosterone cypionate placed in provider box for completion.  Clovis PuMartin, Valgene Deloatch L, RN

## 2014-05-21 ENCOUNTER — Ambulatory Visit (INDEPENDENT_AMBULATORY_CARE_PROVIDER_SITE_OTHER): Payer: Medicare Other | Admitting: *Deleted

## 2014-05-21 DIAGNOSIS — E291 Testicular hypofunction: Secondary | ICD-10-CM

## 2014-05-21 MED ORDER — TESTOSTERONE CYPIONATE 200 MG/ML IM SOLN
400.0000 mg | INTRAMUSCULAR | Status: DC
  Administered 2014-05-21: 400 mg via INTRAMUSCULAR

## 2014-05-21 NOTE — Progress Notes (Signed)
   Pt in nurse clinic for testosterone cypionate.  2 ML given in LUOQ.  Next appt 06/04/2014 at 2:30 PM.  Clovis PuMartin, Tamika L, RN

## 2014-05-21 NOTE — Progress Notes (Signed)
[facility]  PA approved until 05/18/2015. Clovis PuMartin, Jacayla Nordell L, RN

## 2014-06-04 ENCOUNTER — Ambulatory Visit (INDEPENDENT_AMBULATORY_CARE_PROVIDER_SITE_OTHER): Payer: Medicare Other | Admitting: *Deleted

## 2014-06-04 DIAGNOSIS — E291 Testicular hypofunction: Secondary | ICD-10-CM

## 2014-06-04 MED ORDER — TESTOSTERONE CYPIONATE 200 MG/ML IM SOLN
400.0000 mg | INTRAMUSCULAR | Status: DC
Start: 2014-06-04 — End: 2014-06-04
  Administered 2014-06-04: 400 mg via INTRAMUSCULAR

## 2014-06-04 NOTE — Progress Notes (Signed)
   Pt in nurse clinic for testosterone cypionate injection.  400 mg/ 2 ML given RUOQ.  Next appt 06/18/2014 at 2:30 PM.  Clovis Pu, RN

## 2014-06-18 ENCOUNTER — Ambulatory Visit (INDEPENDENT_AMBULATORY_CARE_PROVIDER_SITE_OTHER): Payer: Medicare Other | Admitting: *Deleted

## 2014-06-18 ENCOUNTER — Telehealth: Payer: Self-pay | Admitting: *Deleted

## 2014-06-18 DIAGNOSIS — F32A Depression, unspecified: Secondary | ICD-10-CM

## 2014-06-18 DIAGNOSIS — F329 Major depressive disorder, single episode, unspecified: Secondary | ICD-10-CM

## 2014-06-18 DIAGNOSIS — E291 Testicular hypofunction: Secondary | ICD-10-CM

## 2014-06-18 MED ORDER — CITALOPRAM HYDROBROMIDE 40 MG PO TABS: 40.0000 mg | ORAL_TABLET | Freq: Every day | ORAL | Status: DC

## 2014-06-18 MED ORDER — TESTOSTERONE CYPIONATE 200 MG/ML IM SOLN
400.0000 mg | INTRAMUSCULAR | Status: DC
  Administered 2014-06-18: 400 mg via INTRAMUSCULAR

## 2014-06-18 NOTE — Telephone Encounter (Signed)
Verbal Order to refill Celexa by Dr. Caleb Popp.  Clovis Pu, RN

## 2014-06-18 NOTE — Progress Notes (Signed)
   Pt in nurse clinic for testosterone cypionate.  Testosterone cypionate 400 mg/2 ml given LUOQ.  Next appt 07/02/2014 injection and 07/24/2014 with PCP.  Clovis Pu, RN

## 2014-07-02 ENCOUNTER — Ambulatory Visit: Payer: Medicare Other

## 2014-07-03 ENCOUNTER — Ambulatory Visit (INDEPENDENT_AMBULATORY_CARE_PROVIDER_SITE_OTHER): Payer: Medicare Other | Admitting: *Deleted

## 2014-07-03 DIAGNOSIS — Z23 Encounter for immunization: Secondary | ICD-10-CM

## 2014-07-03 DIAGNOSIS — E291 Testicular hypofunction: Secondary | ICD-10-CM

## 2014-07-03 MED ORDER — TESTOSTERONE CYPIONATE 200 MG/ML IM SOLN
400.0000 mg | INTRAMUSCULAR | Status: DC
  Administered 2014-07-03: 400 mg via INTRAMUSCULAR

## 2014-07-03 NOTE — Progress Notes (Signed)
   Pt in nurse clinic for testosterone cypionate.  Injection given RUOQ.  Next appt 07/17/2014 at 2:30 PM.  Clovis PuMartin, Aleathea Pugmire L, RN

## 2014-07-17 ENCOUNTER — Ambulatory Visit (INDEPENDENT_AMBULATORY_CARE_PROVIDER_SITE_OTHER): Payer: Medicare Other | Admitting: *Deleted

## 2014-07-17 DIAGNOSIS — E291 Testicular hypofunction: Secondary | ICD-10-CM

## 2014-07-17 DIAGNOSIS — E349 Endocrine disorder, unspecified: Secondary | ICD-10-CM

## 2014-07-17 MED ORDER — TESTOSTERONE CYPIONATE 200 MG/ML IM SOLN
400.0000 mg | INTRAMUSCULAR | Status: DC
  Administered 2014-07-17: 400 mg via INTRAMUSCULAR

## 2014-07-17 NOTE — Progress Notes (Signed)
   Pt in nurse clinic for testosterone injection.  Injection given left ventrolateral.  Next appt 07/31/2014 at 2:30 PM.

## 2014-07-24 ENCOUNTER — Ambulatory Visit: Payer: Medicare Other | Admitting: Family Medicine

## 2014-07-31 ENCOUNTER — Ambulatory Visit: Payer: Medicare Other

## 2014-08-02 ENCOUNTER — Ambulatory Visit (INDEPENDENT_AMBULATORY_CARE_PROVIDER_SITE_OTHER): Payer: Medicare Other | Admitting: *Deleted

## 2014-08-02 ENCOUNTER — Other Ambulatory Visit: Payer: Self-pay | Admitting: *Deleted

## 2014-08-02 DIAGNOSIS — E349 Endocrine disorder, unspecified: Secondary | ICD-10-CM

## 2014-08-02 DIAGNOSIS — E291 Testicular hypofunction: Secondary | ICD-10-CM

## 2014-08-02 MED ORDER — TESTOSTERONE CYPIONATE 200 MG/ML IM SOLN
400.0000 mg | INTRAMUSCULAR | Status: DC
  Administered 2014-08-02: 400 mg via INTRAMUSCULAR

## 2014-08-02 MED ORDER — TESTOSTERONE CYPIONATE 200 MG/ML IM SOLN: 400.0000 mg | INTRAMUSCULAR | Status: DC

## 2014-08-02 NOTE — Progress Notes (Signed)
   Pt in nurse clinic for testosterone injection.  2 ML given right upper outer quadrant.  Next appt 08/16/2014.  Clovis PuMartin, Tamika L, RN

## 2014-08-02 NOTE — Telephone Encounter (Signed)
Pt is out of medication.  Kamron Portee L, RN  

## 2014-08-03 MED ORDER — TESTOSTERONE CYPIONATE 200 MG/ML IM SOLN: 400.0000 mg | INTRAMUSCULAR | Status: DC

## 2014-08-03 NOTE — Telephone Encounter (Signed)
Medication resent electronically.  Clovis PuMartin, Tamika L, RN

## 2014-08-03 NOTE — Telephone Encounter (Signed)
Medication called into the [facility]. Clovis PuMartin, Bluford Sedler L, RN

## 2014-08-03 NOTE — Addendum Note (Signed)
Addended by: Clovis PuMARTIN, TAMIKA L on: 08/03/2014 08:07 AM   Modules accepted: Orders

## 2014-08-10 ENCOUNTER — Ambulatory Visit: Payer: Medicare Other | Admitting: Family Medicine

## 2014-08-16 ENCOUNTER — Ambulatory Visit: Payer: Medicare Other

## 2014-08-20 ENCOUNTER — Ambulatory Visit (INDEPENDENT_AMBULATORY_CARE_PROVIDER_SITE_OTHER): Payer: Medicare Other | Admitting: *Deleted

## 2014-08-20 DIAGNOSIS — E349 Endocrine disorder, unspecified: Secondary | ICD-10-CM

## 2014-08-20 DIAGNOSIS — E291 Testicular hypofunction: Secondary | ICD-10-CM

## 2014-08-20 MED ORDER — TESTOSTERONE CYPIONATE 200 MG/ML IM SOLN
400.0000 mg | Freq: Once | INTRAMUSCULAR | Status: AC
  Administered 2014-08-20: 400 mg via INTRAMUSCULAR

## 2014-08-20 NOTE — Progress Notes (Signed)
Pt in nurse clinic for testosterone injection. Injection given left ventrolateral. Next appt 09/03/2014 at 2:30 PM

## 2014-09-03 ENCOUNTER — Ambulatory Visit (INDEPENDENT_AMBULATORY_CARE_PROVIDER_SITE_OTHER): Payer: Medicare Other | Admitting: *Deleted

## 2014-09-03 DIAGNOSIS — E291 Testicular hypofunction: Secondary | ICD-10-CM

## 2014-09-03 DIAGNOSIS — E349 Endocrine disorder, unspecified: Secondary | ICD-10-CM

## 2014-09-03 MED ORDER — TESTOSTERONE CYPIONATE 200 MG/ML IM SOLN
400.0000 mg | INTRAMUSCULAR | Status: DC
  Administered 2014-09-03: 400 mg via INTRAMUSCULAR

## 2014-09-03 NOTE — Progress Notes (Signed)
   Pt in nurse clinic for testosterone cypionate 400 mg/2 mL injection.  Injection given RUOQ.  Next appt 09/17/2014.  Clovis PuMartin, Terrilee Dudzik L, RN

## 2014-09-11 ENCOUNTER — Other Ambulatory Visit: Payer: Self-pay | Admitting: *Deleted

## 2014-09-13 MED ORDER — PANTOPRAZOLE SODIUM 40 MG PO TBEC: DELAYED_RELEASE_TABLET | ORAL | Status: DC

## 2014-09-17 ENCOUNTER — Ambulatory Visit (INDEPENDENT_AMBULATORY_CARE_PROVIDER_SITE_OTHER): Payer: Medicare Other | Admitting: *Deleted

## 2014-09-17 DIAGNOSIS — E349 Endocrine disorder, unspecified: Secondary | ICD-10-CM

## 2014-09-17 DIAGNOSIS — E291 Testicular hypofunction: Secondary | ICD-10-CM

## 2014-09-17 MED ORDER — TESTOSTERONE CYPIONATE 200 MG/ML IM SOLN
400.0000 mg | INTRAMUSCULAR | Status: DC
  Administered 2014-09-17: 400 mg via INTRAMUSCULAR

## 2014-09-17 NOTE — Progress Notes (Signed)
   Pt in nurse clinic for testosterone cypionate injection.  Testosterone 400 mg/2 ml given LUOQ.  Next appt 10/01/2014.  Pt requested a Rx for Chantix.  Pt stated he started back smoking recently and wants to quit and Chantix was the only thing that helped.  Will forward to PCP.  Clovis PuMartin, Tamika L, RN

## 2014-10-01 ENCOUNTER — Ambulatory Visit (INDEPENDENT_AMBULATORY_CARE_PROVIDER_SITE_OTHER): Payer: Medicare Other | Admitting: *Deleted

## 2014-10-01 DIAGNOSIS — E349 Endocrine disorder, unspecified: Secondary | ICD-10-CM

## 2014-10-01 DIAGNOSIS — E291 Testicular hypofunction: Secondary | ICD-10-CM

## 2014-10-01 MED ORDER — TESTOSTERONE CYPIONATE 200 MG/ML IM SOLN
400.0000 mg | INTRAMUSCULAR | Status: DC
  Administered 2014-10-01: 400 mg via INTRAMUSCULAR

## 2014-10-01 NOTE — Progress Notes (Signed)
   Pt in nurse clinic for testosterone cypionate injection.  Testosterone 400 mg/2 ML given RUOQ.  Next appt 10/15/2014 at 2:30 PM.  Clovis PuMartin, Georgia Delsignore L, RN

## 2014-10-15 ENCOUNTER — Ambulatory Visit (INDEPENDENT_AMBULATORY_CARE_PROVIDER_SITE_OTHER): Payer: Medicare Other | Admitting: *Deleted

## 2014-10-15 DIAGNOSIS — E291 Testicular hypofunction: Secondary | ICD-10-CM

## 2014-10-15 DIAGNOSIS — E349 Endocrine disorder, unspecified: Secondary | ICD-10-CM

## 2014-10-15 MED ORDER — TESTOSTERONE CYPIONATE 200 MG/ML IM SOLN
400.0000 mg | INTRAMUSCULAR | Status: DC
  Administered 2014-10-15: 400 mg via INTRAMUSCULAR

## 2014-10-15 NOTE — Progress Notes (Signed)
   Pt in nurse clinic for testosterone cypionate injection.  Injection given LOUQ.  Next appt 10/29/2014 and follow up appt with PCP 11/08/2014 at 2:30 PM.  Clovis PuMartin, Tamika L, RN

## 2014-10-18 ENCOUNTER — Encounter (HOSPITAL_COMMUNITY): Payer: Self-pay | Admitting: Urology

## 2014-10-29 ENCOUNTER — Ambulatory Visit (INDEPENDENT_AMBULATORY_CARE_PROVIDER_SITE_OTHER): Payer: Medicare Other | Admitting: *Deleted

## 2014-10-29 DIAGNOSIS — E349 Endocrine disorder, unspecified: Secondary | ICD-10-CM

## 2014-10-29 DIAGNOSIS — E291 Testicular hypofunction: Secondary | ICD-10-CM

## 2014-10-29 MED ORDER — TESTOSTERONE CYPIONATE 200 MG/ML IM SOLN
400.0000 mg | INTRAMUSCULAR | Status: DC
  Administered 2014-10-29: 400 mg via INTRAMUSCULAR

## 2014-10-29 NOTE — Progress Notes (Signed)
   Pt in nurse clinic for testosterone cypionate injection.  Injection given RUOQ.  Next appt 11/12/2014 at 2:30 PM and follow up appt with PCP on 11/08/2014 at 2:30 PM.  Clovis PuMartin, Anaily Ashbaugh L, RN

## 2014-11-08 ENCOUNTER — Encounter: Payer: Self-pay | Admitting: Family Medicine

## 2014-11-08 ENCOUNTER — Ambulatory Visit (INDEPENDENT_AMBULATORY_CARE_PROVIDER_SITE_OTHER): Payer: Medicare Other | Admitting: Family Medicine

## 2014-11-08 VITALS — BP 204/116 | HR 113 | Temp 98.8°F | Ht 71.0 in | Wt 238.8 lb

## 2014-11-08 DIAGNOSIS — S82892A Other fracture of left lower leg, initial encounter for closed fracture: Secondary | ICD-10-CM

## 2014-11-08 DIAGNOSIS — F32A Depression, unspecified: Secondary | ICD-10-CM

## 2014-11-08 DIAGNOSIS — I10 Essential (primary) hypertension: Secondary | ICD-10-CM

## 2014-11-08 DIAGNOSIS — E349 Endocrine disorder, unspecified: Secondary | ICD-10-CM

## 2014-11-08 DIAGNOSIS — R5383 Other fatigue: Secondary | ICD-10-CM

## 2014-11-08 DIAGNOSIS — E291 Testicular hypofunction: Secondary | ICD-10-CM

## 2014-11-08 DIAGNOSIS — F329 Major depressive disorder, single episode, unspecified: Secondary | ICD-10-CM

## 2014-11-08 MED ORDER — IBUPROFEN 800 MG PO TABS: 800.0000 mg | ORAL_TABLET | Freq: Three times a day (TID) | ORAL | Status: DC | PRN

## 2014-11-08 MED ORDER — AMLODIPINE BESYLATE 5 MG PO TABS: 5.0000 mg | ORAL_TABLET | Freq: Every day | ORAL | Status: DC

## 2014-11-08 MED ORDER — PANTOPRAZOLE SODIUM 40 MG PO TBEC: DELAYED_RELEASE_TABLET | ORAL | Status: DC

## 2014-11-08 MED ORDER — CARVEDILOL 6.25 MG PO TABS: 6.2500 mg | ORAL_TABLET | Freq: Two times a day (BID) | ORAL | Status: DC

## 2014-11-08 MED ORDER — CITALOPRAM HYDROBROMIDE 20 MG PO TABS: 20.0000 mg | ORAL_TABLET | Freq: Every day | ORAL | Status: DC

## 2014-11-08 NOTE — Progress Notes (Signed)
    Subjective    Jeffrey [name] is a 54 y.o. male that presents for an office visit.   1. Testosterone deficiency: Family history of testosterone deficiency. No issues with the testosterone injections.  2. Depression: Has been off of his Celexa for the last 3 months. Currently has symptoms of fatigue, minimal drive to leave home, pain, and worsening mood. No suicidal ideation.   3. Hypertension: Patient states he has achy headaches, that have worsened recently. No recent vision changes. No chest pain or shortness of breath. Patient has not been taking his blood pressure medication for over one month.  History  Substance Use Topics  . Smoking status: Current Some Day Smoker -- 0.50 packs/day for 16 years    Types: Cigarettes    Last Attempt to Quit: 07/16/2013  . Smokeless tobacco: Never Used  . Alcohol Use: No    Allergies  Allergen Reactions  . Ace Inhibitors     Possible ACEi induced renal failure.   [name]. Hctz [Hydrochlorothiazide]     Thiazide induced hypercalcemia   . Lipitor [Atorvastatin] Nausea And Vomiting  . Penicillins     REACTION: hives    No orders of the defined types were placed in this encounter.    ROS  Per HPI   Objective   BP 204/116 mmHg  Pulse 113  Temp(Src) 98.8 F (37.1 C) (Oral)  Ht 5\' 11"  (1.803 m)  Wt 238 lb 12.8 oz (108.319 kg)  BMI 33.32 kg/m2  General: Well appearing, appears slightly agitated  Psych: normal affect  Assessment and Plan   Please refer to problem based charting of assessment and plan

## 2014-11-08 NOTE — Patient Instructions (Addendum)
Thank you for coming to see me today. It was a pleasure. Today we talked about:   Testosterone deficiency: Will check testosterone  Depression: will restart Celexa today at . If you have thoughts of hurting yourself, please call 911 immediately.  High blood pressure: We will restart your amlodipine at  and your Coreg at 6.25mg .  Fatigue: I will check your thyroid hormone, but this may be related to your depression or sleep apnea too. We will follow this up.  Please make an appointment to see me in 3-4 weeks for follow-up.  If you have any questions or concerns, please do not hesitate to call the office at [phone].  Sincerely,  Jacquelin Hawking, MD   Citalopram tablets What is this medicine? CITALOPRAM (sye TAL oh pram) is a medicine for depression. This medicine may be used for other purposes; ask your health care provider or pharmacist if you have questions. COMMON BRAND NAME(S): Celexa What should I tell my health care provider before I take this medicine? They need to know if you have any of these conditions: -bipolar disorder or a family history of bipolar disorder -diabetes -glaucoma -heart disease -history of irregular heartbeat -kidney or liver disease -low levels of magnesium or potassium in the blood -receiving electroconvulsive therapy -seizures (convulsions) -suicidal thoughts or a previous suicide attempt -an unusual or allergic reaction to citalopram, escitalopram, other medicines, foods, dyes, or preservatives -pregnant or trying to become pregnant -breast-feeding How should I use this medicine? Take this medicine by mouth with a glass of water. Follow the directions on the prescription label. You can take it with or without food. Take your medicine at regular intervals. Do not take your medicine more often than directed. Do not stop taking this medicine suddenly except upon the advice of your doctor. Stopping this medicine too quickly may cause serious  side effects or your condition may worsen. A special MedGuide will be given to you by the pharmacist with each prescription and refill. Be sure to read this information carefully each time. Talk to your pediatrician regarding the use of this medicine in children. Special care may be needed. Patients over 62 years old may have a stronger reaction and need a smaller dose. Overdosage: If you think you have taken too much of this medicine contact a poison control center or emergency room at once. NOTE: This medicine is only for you. Do not share this medicine with others. What if I miss a dose? If you miss a dose, take it as soon as you can. If it is almost time for your next dose, take only that dose. Do not take double or extra doses. What may interact with this medicine? Do not take this medicine with any of the following medications: -certain medicines for fungal infections like fluconazole, itraconazole, ketoconazole, posaconazole, voriconazole -cisapride -dofetilide -dronedarone -escitalopram -linezolid -MAOIs like Carbex, Eldepryl, Marplan, Nardil, and Parnate -methylene blue (injected into a vein) -pimozide -thioridazine -ziprasidone This medicine may also interact with the following medications: -alcohol -aspirin and aspirin-like medicines -carbamazepine -certain medicines for depression, anxiety, or psychotic disturbances -certain medicines for infections like chloroquine, clarithromycin, erythromycin, furazolidone, isoniazid, pentamidine -certain medicines for migraine headaches like almotriptan, eletriptan, frovatriptan, naratriptan, rizatriptan, sumatriptan, zolmitriptan -certain medicines for sleep -certain medicines that treat or prevent blood clots like dalteparin, enoxaparin, warfarin -cimetidine -diuretics -fentanyl -lithium -methadone -metoprolol -NSAIDs, medicines for pain and inflammation, like ibuprofen or naproxen -omeprazole -other medicines that prolong the  QT interval (cause an abnormal heart rhythm) -procarbazine -  rasagiline -supplements like St. John's wort, kava kava, valerian -tramadol -tryptophan This list may not describe all possible interactions. Give your health care provider a list of all the medicines, herbs, non-prescription drugs, or dietary supplements you use. Also tell them if you smoke, drink alcohol, or use illegal drugs. Some items may interact with your medicine. What should I watch for while using this medicine? Tell your doctor if your symptoms do not get better or if they get worse. Visit your doctor or health care professional for regular checks on your progress. Because it may take several weeks to see the full effects of this medicine, it is important to continue your treatment as prescribed by your doctor. Patients and their families should watch out for new or worsening thoughts of suicide or depression. Also watch out for sudden changes in feelings such as feeling anxious, agitated, panicky, irritable, hostile, aggressive, impulsive, severely restless, overly excited and hyperactive, or not being able to sleep. If this happens, especially at the beginning of treatment or after a change in dose, call your health care professional. Bonita QuinYou may get drowsy or dizzy. Do not drive, use machinery, or do anything that needs mental alertness until you know how this medicine affects you. Do not stand or sit up quickly, especially if you are an older patient. This reduces the risk of dizzy or fainting spells. Alcohol may interfere with the effect of this medicine. Avoid alcoholic drinks. Your mouth may get dry. Chewing sugarless gum or sucking hard candy, and drinking plenty of water will help. Contact your doctor if the problem does not go away or is severe. What side effects may I notice from receiving this medicine? Side effects that you should report to your doctor or health care professional as soon as possible: -allergic reactions  like skin rash, itching or hives, swelling of the face, lips, or tongue -chest pain -confusion -dizziness -fast, irregular heartbeat -fast talking and excited feelings or actions that are out of control -feeling faint or lightheaded, falls -hallucination, loss of contact with reality -seizures -shortness of breath -suicidal thoughts or other mood changes -unusual bleeding or bruising Side effects that usually do not require medical attention (report to your doctor or health care professional if they continue or are bothersome): -blurred vision -change in appetite -change in sex drive or performance -headache -increased sweating -nausea -trouble sleeping This list may not describe all possible side effects. Call your doctor for medical advice about side effects. You may report side effects to FDA at 1-800-FDA-1088. Where should I keep my medicine? Keep out of reach of children. Store at room temperature between 15 and 30 degrees C (59 and 86 degrees F). Throw away any unused medicine after the expiration date. NOTE: This sheet is a summary. It may not cover all possible information. If you have questions about this medicine, talk to your doctor, pharmacist, or health care provider.  2015, Elsevier/Gold Standard. (2013-04-14 13:19:48)

## 2014-11-09 ENCOUNTER — Encounter: Payer: Self-pay | Admitting: Family Medicine

## 2014-11-09 LAB — TESTOSTERONE: Testosterone: 649 ng/dL (ref 300–890)

## 2014-11-09 LAB — TSH: TSH: 2.355 u[IU]/mL (ref 0.350–4.500)

## 2014-11-12 ENCOUNTER — Ambulatory Visit (INDEPENDENT_AMBULATORY_CARE_PROVIDER_SITE_OTHER): Payer: Medicare Other | Admitting: *Deleted

## 2014-11-12 VITALS — BP 180/112 | HR 83

## 2014-11-12 DIAGNOSIS — E349 Endocrine disorder, unspecified: Secondary | ICD-10-CM

## 2014-11-12 DIAGNOSIS — E291 Testicular hypofunction: Secondary | ICD-10-CM

## 2014-11-12 MED ORDER — TESTOSTERONE CYPIONATE 200 MG/ML IM SOLN
400.0000 mg | INTRAMUSCULAR | Status: DC
  Administered 2014-11-12: 400 mg via INTRAMUSCULAR

## 2014-11-12 NOTE — Progress Notes (Signed)
   Pt in nurse clinic for blood pressure check and testosterone injection.  Blood pressure 180/112 manually, heart rate 83.  Pt denies any chest pain, SOB, headache or dizziness.  Testosterone 400 mg/2 ML given LOUQ.  Next appt 11/26/2014 for injection and blood pressure check.  Pt stated he is taking blood pressure medication as prescribed.  Clovis PuMartin, Jerald Hennington L, RN

## 2014-11-16 NOTE — Assessment & Plan Note (Signed)
Patient reporting increased depressed mood. Recently stopped taking Celexa himself. No SI currently.  Celexa 20mg  qD  Handout given, and discussed, possible side effects of medicaiton  Will follow-up in 3-4 weeks due to decreased availability in my schedule for quicker follow-up  Precautions discussed with patient in regards to if he starts having suicidal or homicidal ideation

## 2014-11-16 NOTE — Assessment & Plan Note (Addendum)
Patient adherent with testosterone treatment. No reported side effects.   Repeat testosterone today  Continue q2week testosterone injections  Full physical exam at next visit, including rectal examination

## 2014-11-16 NOTE — Assessment & Plan Note (Addendum)
Patient without symptoms from high blood pressure other than mild headache. Patient has not been adherent with regimen.  Amlodipine 5mg  qD with plans to increase to 10mg  qD if tolerated  Coreg 6.25mg  BID with plans to eventually increase  Follow-up next week with nurse for blood pressure recheck

## 2014-11-26 ENCOUNTER — Other Ambulatory Visit: Payer: Self-pay | Admitting: *Deleted

## 2014-11-26 ENCOUNTER — Ambulatory Visit (INDEPENDENT_AMBULATORY_CARE_PROVIDER_SITE_OTHER): Payer: Medicare Other | Admitting: *Deleted

## 2014-11-26 VITALS — BP 178/100 | HR 92

## 2014-11-26 DIAGNOSIS — E291 Testicular hypofunction: Secondary | ICD-10-CM

## 2014-11-26 DIAGNOSIS — E349 Endocrine disorder, unspecified: Secondary | ICD-10-CM

## 2014-11-26 DIAGNOSIS — Z0130 Encounter for examination of blood pressure without abnormal findings: Secondary | ICD-10-CM

## 2014-11-26 DIAGNOSIS — Z720 Tobacco use: Secondary | ICD-10-CM

## 2014-11-26 MED ORDER — TESTOSTERONE CYPIONATE 200 MG/ML IM SOLN
400.0000 mg | INTRAMUSCULAR | Status: DC
  Administered 2014-11-26: 400 mg via INTRAMUSCULAR

## 2014-11-26 NOTE — Telephone Encounter (Signed)
Pt is ready to quit smoking cigarettes. He is up to 1 pack a day.  Appt with PCP 12/10/2014.  Clovis PuMartin, Tamika L, RN

## 2014-11-26 NOTE — Progress Notes (Signed)
   Pt in nurse clinic for testosterone cypionate injection.  Injection give RUOQ; 2 ml.  Blood pressure 178/100 manually, heart rate 92.  Pt requested to restart Chanix.  Pt is up to 1 pack of cigarettes a day.  Appt with PCP for follow up 12/10/2014 at 2:30 PM.  Will forward to PCP.  Clovis PuMartin, Tamika L, RN

## 2014-12-04 ENCOUNTER — Other Ambulatory Visit: Payer: Self-pay | Admitting: *Deleted

## 2014-12-05 MED ORDER — PANTOPRAZOLE SODIUM 40 MG PO TBEC: DELAYED_RELEASE_TABLET | ORAL | Status: DC

## 2014-12-06 ENCOUNTER — Other Ambulatory Visit: Payer: Self-pay | Admitting: Family Medicine

## 2014-12-06 NOTE — Telephone Encounter (Signed)
Will increase to amlodipine 10mg  daily. Refill carvedilol 6.25mg  BID and ibuprofen 800mg  TID prn.

## 2014-12-07 NOTE — Telephone Encounter (Signed)
LVM for patient to call back. ?

## 2014-12-10 ENCOUNTER — Encounter: Payer: Self-pay | Admitting: Family Medicine

## 2014-12-10 ENCOUNTER — Ambulatory Visit (INDEPENDENT_AMBULATORY_CARE_PROVIDER_SITE_OTHER): Payer: Medicare Other | Admitting: Family Medicine

## 2014-12-10 VITALS — BP 150/100 | HR 87 | Temp 98.5°F | Ht 71.0 in | Wt 238.1 lb

## 2014-12-10 DIAGNOSIS — F17200 Nicotine dependence, unspecified, uncomplicated: Secondary | ICD-10-CM

## 2014-12-10 DIAGNOSIS — Z720 Tobacco use: Secondary | ICD-10-CM

## 2014-12-10 DIAGNOSIS — E349 Endocrine disorder, unspecified: Secondary | ICD-10-CM

## 2014-12-10 DIAGNOSIS — E291 Testicular hypofunction: Secondary | ICD-10-CM

## 2014-12-10 DIAGNOSIS — F329 Major depressive disorder, single episode, unspecified: Secondary | ICD-10-CM

## 2014-12-10 DIAGNOSIS — F32A Depression, unspecified: Secondary | ICD-10-CM

## 2014-12-10 DIAGNOSIS — I10 Essential (primary) hypertension: Secondary | ICD-10-CM

## 2014-12-10 MED ORDER — CARVEDILOL 12.5 MG PO TABS: 12.5000 mg | ORAL_TABLET | Freq: Two times a day (BID) | ORAL | Status: DC

## 2014-12-10 MED ORDER — TESTOSTERONE CYPIONATE 200 MG/ML IM SOLN
400.0000 mg | Freq: Once | INTRAMUSCULAR | Status: AC
  Administered 2014-12-10: 400 mg via INTRAMUSCULAR

## 2014-12-10 MED ORDER — VARENICLINE TARTRATE 0.5 MG PO TABS: ORAL_TABLET | ORAL | Status: AC

## 2014-12-10 MED ORDER — VARENICLINE TARTRATE 1 MG PO TABS: 1.0000 mg | ORAL_TABLET | Freq: Two times a day (BID) | ORAL | Status: DC

## 2014-12-10 MED ORDER — CITALOPRAM HYDROBROMIDE 40 MG PO TABS: 40.0000 mg | ORAL_TABLET | Freq: Every day | ORAL | Status: DC

## 2014-12-10 NOTE — Patient Instructions (Signed)
Thank you for coming to see me today. It was a pleasure. Today we talked about:   Depression: I will increase you to Celexa 40mg  daily  High blood pressure: Your blood pressure is better today. I will increase your Coreg to 12.5mg  twice daily  Tobacco abuse: Quit date: Immediately. I am prescribing Chantix. Please use as I have directed  Please make an appointment to see me in 3 months for follow-up.  If you have any questions or concerns, please do not hesitate to call the office at [phone].  Sincerely,  Jacquelin Hawkingalph Collyn Ribas, MD

## 2014-12-10 NOTE — Assessment & Plan Note (Signed)
Quit date set to 12/10/2014. Patient has had success with Chantix in the past.  Restarted Chantix

## 2014-12-10 NOTE — Assessment & Plan Note (Signed)
Mild improvement with initiation of Celexa. No side effects reported. Patient previously on dose of $RemoveBefor [id number]$40mgng to Celexa 40mg 

## 2014-12-10 NOTE — Progress Notes (Signed)
    Subjective    Jeffrey [name] is a 54 y.o. male that presents for an office visit.   1. Depression: Improved mood. No side effects. Adherent with medication regimen. No suicidal ideation.  2. Hypertension: Adherent with amlodipine 10mg  since starting the last two days and Carvedilol 6.25mg  BID. Wakes up with headaches. Has daytime somnolence. No lightheadedness, chest pain or shortness of breath.  3. Tobacco abuse: Has restarted smoking about 3-4 months ago. Currently smokes about 1 pack per day. Smoking calms him. States Chantix has worked well in the past. Has tried patches, gum in the past, which did not help much. Has a smoking history of 37 years.  History  Substance Use Topics  . Smoking status: Current Every Day Smoker -- 1.00 packs/day for 16 years    Types: Cigarettes    Last Attempt to Quit: 07/16/2013  . Smokeless tobacco: Never Used  . Alcohol Use: No    Allergies  Allergen Reactions  . Ace Inhibitors     Possible ACEi induced renal failure.   [name]. Hctz [Hydrochlorothiazide]     Thiazide induced hypercalcemia   . Lipitor [Atorvastatin] Nausea And Vomiting  . Penicillins     REACTION: hives    No orders of the defined types were placed in this encounter.    ROS  Per HPI   Objective   BP 150/100 mmHg  Pulse 87  Temp(Src) 98.5 F (36.9 C) (Oral)  Ht 5\' 11"  (1.803 m)  Wt 238 lb 1.6 oz (108.001 kg)  BMI 33.22 kg/m2  General: Well appearing, no distress Psych: flat affect, no suicidal or homicidal ideation  Assessment and Plan   Please refer to problem based charting of assessment and plan

## 2014-12-10 NOTE — Assessment & Plan Note (Addendum)
Patient still with significant hypertension. Adherent with amlodipine and Coreg.   Increase to Coreg 12.5mg  BID  Continue amlodipine 10mg  daily  Will increase Coreg as tolerated and may need to start hydralazine  Blood pressure checks with testosterone encounters q2weekly

## 2014-12-24 ENCOUNTER — Ambulatory Visit: Payer: Medicare Other

## 2014-12-26 ENCOUNTER — Ambulatory Visit (INDEPENDENT_AMBULATORY_CARE_PROVIDER_SITE_OTHER): Payer: Medicare Other | Admitting: *Deleted

## 2014-12-26 DIAGNOSIS — E291 Testicular hypofunction: Secondary | ICD-10-CM

## 2014-12-26 DIAGNOSIS — E349 Endocrine disorder, unspecified: Secondary | ICD-10-CM

## 2014-12-26 MED ORDER — TESTOSTERONE CYPIONATE 200 MG/ML IM SOLN
400.0000 mg | INTRAMUSCULAR | Status: DC
  Administered 2014-12-26: 400 mg via INTRAMUSCULAR

## 2014-12-26 NOTE — Progress Notes (Signed)
Pt presents to clinic today for testosterone injection.  His last dose was given on 12/10/14 so he is due.  Pt injected with no complications. Fleeger, [name]

## 2015-01-10 ENCOUNTER — Other Ambulatory Visit: Payer: Self-pay | Admitting: Family Medicine

## 2015-01-12 ENCOUNTER — Other Ambulatory Visit: Payer: Self-pay | Admitting: Family Medicine

## 2015-01-17 ENCOUNTER — Telehealth: Payer: Self-pay | Admitting: Family Medicine

## 2015-01-17 ENCOUNTER — Other Ambulatory Visit: Payer: Self-pay | Admitting: Orthopedic Surgery

## 2015-01-17 DIAGNOSIS — M8710 Osteonecrosis due to drugs, unspecified bone: Secondary | ICD-10-CM

## 2015-01-17 DIAGNOSIS — T50905A Adverse effect of unspecified drugs, medicaments and biological substances, initial encounter: Principal | ICD-10-CM

## 2015-01-17 DIAGNOSIS — F329 Major depressive disorder, single episode, unspecified: Secondary | ICD-10-CM

## 2015-01-17 DIAGNOSIS — F32A Depression, unspecified: Secondary | ICD-10-CM

## 2015-01-17 MED ORDER — CITALOPRAM HYDROBROMIDE 20 MG PO TABS: 20.0000 mg | ORAL_TABLET | Freq: Two times a day (BID) | ORAL | Status: DC

## 2015-01-17 NOTE — Telephone Encounter (Signed)
Wants to speak to Tamika about an appt and medications / thanks HoneywellSadie Reynolds, ASA

## 2015-01-17 NOTE — Telephone Encounter (Signed)
Covering inbox for Dr. Caleb PoppNettey:  Reviewed chart. Called patient and spoke with Sherilyn BankerBoyd [name]. He states that he was previously on Celexa 20mg  PO BID prior to switch to 40 daily recently. Tolerates 20 BID better with regards to Gi side effects. Requested new rx to go back to 20 mg Celexa tablets BID.  [facility] Celexa 20mg  PO BID (#60 tablets for 1 month supply, 0 refills). Advised to follow-up to Dr. Caleb PoppNettey as scheduled.  Saralyn PilarAlexander Karamalegos, DO [facility], PGY-2

## 2015-01-17 NOTE — Telephone Encounter (Signed)
Pt is requesting for Celexa 20 mg BID.  Pt stated he can tolerate the 20 mg better than the 40 mg.  The 40 mg tablet upset his stomach.  Please send Celexa 20 mg BID to [facility].  Clovis PuMartin, Elyas Villamor L, RN

## 2015-01-21 ENCOUNTER — Ambulatory Visit (INDEPENDENT_AMBULATORY_CARE_PROVIDER_SITE_OTHER): Payer: Medicare Other | Admitting: *Deleted

## 2015-01-21 DIAGNOSIS — E349 Endocrine disorder, unspecified: Secondary | ICD-10-CM

## 2015-01-21 DIAGNOSIS — E291 Testicular hypofunction: Secondary | ICD-10-CM | POA: Diagnosis not present

## 2015-01-21 MED ORDER — TESTOSTERONE CYPIONATE 200 MG/ML IM SOLN
400.0000 mg | INTRAMUSCULAR | Status: DC
  Administered 2015-01-21: 400 mg via INTRAMUSCULAR

## 2015-01-21 NOTE — Progress Notes (Signed)
   Pt in nurse clinic for testosterone cypionate injection. 2 ML given LUOQ. Next appt 02/04/15 at 2:30 pm.  Clovis PuMartin, Kirstine Jacquin L, RN

## 2015-01-22 ENCOUNTER — Ambulatory Visit
Admission: RE | Admit: 2015-01-22 | Discharge: 2015-01-22 | Disposition: A | Discharge status: Home / Self Care | Payer: Medicare Other | Source: Ambulatory Visit | Attending: Orthopedic Surgery | Admitting: Orthopedic Surgery

## 2015-01-22 DIAGNOSIS — M8710 Osteonecrosis due to drugs, unspecified bone: Secondary | ICD-10-CM

## 2015-01-22 DIAGNOSIS — T50905A Adverse effect of unspecified drugs, medicaments and biological substances, initial encounter: Principal | ICD-10-CM

## 2015-02-04 ENCOUNTER — Ambulatory Visit (INDEPENDENT_AMBULATORY_CARE_PROVIDER_SITE_OTHER): Payer: Medicare Other | Admitting: *Deleted

## 2015-02-04 DIAGNOSIS — E349 Endocrine disorder, unspecified: Secondary | ICD-10-CM

## 2015-02-04 DIAGNOSIS — E291 Testicular hypofunction: Secondary | ICD-10-CM

## 2015-02-04 MED ORDER — TESTOSTERONE CYPIONATE 200 MG/ML IM SOLN
400.0000 mg | Freq: Once | INTRAMUSCULAR | Status: AC
Start: 2015-02-04 — End: 2015-02-04
  Administered 2015-02-04: 400 mg via INTRAMUSCULAR

## 2015-02-04 MED ORDER — TESTOSTERONE CYPIONATE 200 MG/ML IM SOLN: 400.0000 mg | Freq: Once | INTRAMUSCULAR | Status: DC

## 2015-02-04 NOTE — Progress Notes (Signed)
Pt arrives today for his q 2 week testosterone shot.  2mls Given RUOQ and tolerated well.  Next injection scheduled for 02/18/15 @ 2:30pm. Fleeger, [name]

## 2015-02-15 ENCOUNTER — Other Ambulatory Visit: Payer: Self-pay | Admitting: *Deleted

## 2015-02-15 DIAGNOSIS — E349 Endocrine disorder, unspecified: Secondary | ICD-10-CM

## 2015-02-15 MED ORDER — TESTOSTERONE CYPIONATE 200 MG/ML IM SOLN: 400.0000 mg | INTRAMUSCULAR | Status: DC

## 2015-02-18 ENCOUNTER — Ambulatory Visit: Payer: Medicare Other

## 2015-02-20 ENCOUNTER — Ambulatory Visit: Payer: Medicare Other

## 2015-02-21 ENCOUNTER — Other Ambulatory Visit: Payer: Self-pay | Admitting: Family Medicine

## 2015-02-25 ENCOUNTER — Ambulatory Visit (INDEPENDENT_AMBULATORY_CARE_PROVIDER_SITE_OTHER): Payer: Medicare Other | Admitting: *Deleted

## 2015-02-25 ENCOUNTER — Other Ambulatory Visit: Payer: Self-pay | Admitting: *Deleted

## 2015-02-25 DIAGNOSIS — E349 Endocrine disorder, unspecified: Secondary | ICD-10-CM

## 2015-02-25 DIAGNOSIS — E291 Testicular hypofunction: Secondary | ICD-10-CM

## 2015-02-25 MED ORDER — TESTOSTERONE CYPIONATE 200 MG/ML IM SOLN
400.0000 mg | INTRAMUSCULAR | Status: DC
  Administered 2015-02-25: 400 mg via INTRAMUSCULAR

## 2015-02-25 NOTE — Progress Notes (Signed)
   Pt in nurse clinic for testosterone cyp injection.  4mg / 2 ml given LOUQ.  Next appt 03/11/15.  Clovis PuMartin, Tamika L, RN

## 2015-03-06 ENCOUNTER — Other Ambulatory Visit: Payer: Self-pay | Admitting: *Deleted

## 2015-03-06 NOTE — Telephone Encounter (Signed)
2nd request.  Martin, Tamika L, RN  

## 2015-03-07 NOTE — Telephone Encounter (Signed)
3rd request. Tallis Soledad L, RN  

## 2015-03-11 ENCOUNTER — Ambulatory Visit (INDEPENDENT_AMBULATORY_CARE_PROVIDER_SITE_OTHER): Payer: Medicare Other | Admitting: *Deleted

## 2015-03-11 DIAGNOSIS — E291 Testicular hypofunction: Secondary | ICD-10-CM

## 2015-03-11 DIAGNOSIS — E349 Endocrine disorder, unspecified: Secondary | ICD-10-CM

## 2015-03-11 MED ORDER — PANTOPRAZOLE SODIUM 40 MG PO TBEC: DELAYED_RELEASE_TABLET | ORAL | Status: DC

## 2015-03-11 MED ORDER — TESTOSTERONE CYPIONATE 200 MG/ML IM SOLN
400.0000 mg | INTRAMUSCULAR | Status: DC
  Administered 2015-03-11: 400 mg via INTRAMUSCULAR

## 2015-03-11 NOTE — Progress Notes (Signed)
   Pt in nurse clinic for testosterone injection.  Testosterone Cyp 400 mg/2 ml given RUOQ.  Next appt 03/27/2015. Clovis PuMartin, Tamika L, RN

## 2015-03-27 ENCOUNTER — Ambulatory Visit: Payer: Medicare Other

## 2015-04-04 ENCOUNTER — Other Ambulatory Visit: Payer: Self-pay | Admitting: Family Medicine

## 2015-04-05 ENCOUNTER — Other Ambulatory Visit: Payer: Self-pay | Admitting: *Deleted

## 2015-04-08 MED ORDER — AMLODIPINE BESYLATE 10 MG PO TABS: 10.0000 mg | ORAL_TABLET | Freq: Every day | ORAL | Status: DC

## 2015-05-10 ENCOUNTER — Other Ambulatory Visit: Payer: Self-pay | Admitting: Family Medicine

## 2015-06-12 ENCOUNTER — Other Ambulatory Visit: Payer: Self-pay | Admitting: Family Medicine

## 2015-07-29 ENCOUNTER — Telehealth: Payer: Self-pay | Admitting: Family Medicine

## 2015-07-29 NOTE — Telephone Encounter (Signed)
Need to speak with you about his testosterone injections.

## 2015-07-29 NOTE — Telephone Encounter (Signed)
Return call to patient regarding testosterone injections.  Patient stated he stop coming for the injections due to receiving a bill from insurance.  Patient has never received a bill before and he has called and discussed with his insurance and waiting for a response.  Patient requested an appointment for a flu shot and testosterone injection for Wednesday.  Appointment scheduled for 07/31/15 at 3:30 PM.  Clovis PuMartin, Tamika L, RN

## 2015-07-31 ENCOUNTER — Ambulatory Visit (INDEPENDENT_AMBULATORY_CARE_PROVIDER_SITE_OTHER): Payer: Medicare Other | Admitting: *Deleted

## 2015-07-31 DIAGNOSIS — Z23 Encounter for immunization: Secondary | ICD-10-CM

## 2015-07-31 DIAGNOSIS — E349 Endocrine disorder, unspecified: Secondary | ICD-10-CM

## 2015-07-31 DIAGNOSIS — E291 Testicular hypofunction: Secondary | ICD-10-CM | POA: Diagnosis not present

## 2015-07-31 MED ORDER — TESTOSTERONE CYPIONATE 200 MG/ML IM SOLN
400.0000 mg | INTRAMUSCULAR | Status: DC
  Administered 2015-07-31: 400 mg via INTRAMUSCULAR

## 2015-07-31 NOTE — Progress Notes (Signed)
   Patient in nurse clinic for Flu shot and testosterone injection.  Testosterone injection every RUOQ. Next appt 08/13/15 at 2:30 PM.  Clovis PuMartin, Alantra Popoca L, RN

## 2015-08-07 ENCOUNTER — Other Ambulatory Visit: Payer: Self-pay | Admitting: Family Medicine

## 2015-08-09 NOTE — Telephone Encounter (Signed)
Patient needs appointment with me for future refills.

## 2015-08-13 ENCOUNTER — Ambulatory Visit (INDEPENDENT_AMBULATORY_CARE_PROVIDER_SITE_OTHER): Payer: Medicare Other | Admitting: *Deleted

## 2015-08-13 DIAGNOSIS — E349 Endocrine disorder, unspecified: Secondary | ICD-10-CM

## 2015-08-13 DIAGNOSIS — E291 Testicular hypofunction: Secondary | ICD-10-CM | POA: Diagnosis not present

## 2015-08-13 MED ORDER — TESTOSTERONE CYPIONATE 200 MG/ML IM SOLN
400.0000 mg | INTRAMUSCULAR | Status: DC
  Administered 2015-08-13: 400 mg via INTRAMUSCULAR

## 2015-08-13 NOTE — Progress Notes (Signed)
   Patient in nurse clinic for testosterone injection 2 ML.  Injection given LUOQ.  Next appointment 08/27/15 at 2:30 PM.  Clovis PuMartin, Walta Bellville L, RN

## 2015-08-19 ENCOUNTER — Ambulatory Visit: Payer: Medicare Other

## 2015-08-27 ENCOUNTER — Ambulatory Visit: Payer: Medicare Other

## 2015-09-03 ENCOUNTER — Ambulatory Visit (INDEPENDENT_AMBULATORY_CARE_PROVIDER_SITE_OTHER): Payer: Medicare Other | Admitting: *Deleted

## 2015-09-03 ENCOUNTER — Other Ambulatory Visit: Payer: Self-pay | Admitting: *Deleted

## 2015-09-03 DIAGNOSIS — E291 Testicular hypofunction: Secondary | ICD-10-CM | POA: Diagnosis not present

## 2015-09-03 DIAGNOSIS — E349 Endocrine disorder, unspecified: Secondary | ICD-10-CM

## 2015-09-03 MED ORDER — TESTOSTERONE CYPIONATE 200 MG/ML IM SOLN
400.0000 mg | Freq: Once | INTRAMUSCULAR | Status: AC
  Administered 2015-09-03: 400 mg via INTRAMUSCULAR

## 2015-09-03 NOTE — Progress Notes (Signed)
   Patient in nurse clinic for testosterone cypionate (DEPOTESTOTERONE CYPIONATE) 200 MG/ML injection.  Injection given RUOQ.  Next appt 09/17/2015.  Clovis PuMartin, Unika Nazareno L, RN

## 2015-09-04 NOTE — Telephone Encounter (Signed)
Patient needs an appointment for refill. 

## 2015-09-17 ENCOUNTER — Ambulatory Visit: Payer: Medicare Other

## 2015-09-17 ENCOUNTER — Encounter: Payer: Self-pay | Admitting: Family Medicine

## 2015-09-17 ENCOUNTER — Ambulatory Visit (INDEPENDENT_AMBULATORY_CARE_PROVIDER_SITE_OTHER): Payer: Medicare Other | Admitting: Family Medicine

## 2015-09-17 VITALS — BP 167/107 | HR 89 | Temp 98.4°F | Wt 244.0 lb

## 2015-09-17 DIAGNOSIS — F17200 Nicotine dependence, unspecified, uncomplicated: Secondary | ICD-10-CM

## 2015-09-17 DIAGNOSIS — K219 Gastro-esophageal reflux disease without esophagitis: Secondary | ICD-10-CM

## 2015-09-17 DIAGNOSIS — I10 Essential (primary) hypertension: Secondary | ICD-10-CM

## 2015-09-17 DIAGNOSIS — E291 Testicular hypofunction: Secondary | ICD-10-CM

## 2015-09-17 DIAGNOSIS — E349 Endocrine disorder, unspecified: Secondary | ICD-10-CM

## 2015-09-17 MED ORDER — CARVEDILOL 25 MG PO TABS: 25.0000 mg | ORAL_TABLET | Freq: Two times a day (BID) | ORAL | Status: DC

## 2015-09-17 MED ORDER — PANTOPRAZOLE SODIUM 40 MG PO TBEC: 40.0000 mg | DELAYED_RELEASE_TABLET | Freq: Every day | ORAL | Status: DC

## 2015-09-17 MED ORDER — CARVEDILOL 25 MG PO TABS: 12.5000 mg | ORAL_TABLET | Freq: Two times a day (BID) | ORAL | Status: DC

## 2015-09-17 NOTE — Assessment & Plan Note (Signed)
Patient has not been adherent to treatment. Last dose of Testosterone was given in March and patient never followed up.  - Will not administer testosterone injection today as his current level is unknown. Additionally there are risk factors for smokers and increased risk of prostate cancer - Testosterone level taken in lab today - Will follow up with Dr. Caleb PoppNettey

## 2015-09-17 NOTE — Assessment & Plan Note (Signed)
Uncontrolled. Has been out of PPI for the last couple weeks.  - Refilled Pantoprazole 40 mg daily

## 2015-09-17 NOTE — Progress Notes (Signed)
Subjective:    Patient ID: Jeffrey [name] , male   DOB: [birth year] , 54 y.o..   MRN: [id number]  HPI  Jeffrey [name] is here for a testosterone injection.  Low Testosterone: hx of low testosterone. Pt wanted a testosterone injection today. Last injection was in March. States he feels tired all the time and knows his testosterone is low.   Hypertension: Blood pressure is not well controlled at home. He is not exercising and is not adherent to a low-salt diet. He currently takes Amlodipine 10 mg daily and Carvedilol 12.5 mg BID and is adherent to regimen.  Cardiac symptoms: none. Patient denies: chest pain, chest pressure/discomfort, irregular heart beat and syncope. Has headaches daily. Cardiovascular risk factors: hypertension, male gender, obesity (BMI >= 30 kg/m2), sedentary lifestyle and smoking/ tobacco exposure. Use of agents associated with hypertension: none.   GERD: Complains of daily GERD type symptoms. He has been experiencing fullness after meals, belching and eructation, heartburn, symptoms occur at night for many hours. Social history: no or minimal alcohol, smoking 2 PPD. Has been out of Pantoprazole for a couple weeks.   Tobacco Abuse: Has been smoking since he was in his 20's. Currently smoking 2 packs per day. There have been several attempts to quit and some successes. He has tried Chantix in the past and felt this helped at the time. The following triggers have been identified: psychological: after meals, house, emotional: anxiety, sadness and stress, physical: wakes to smoke, cigarettes within 30 minutes of a.m. Identified withdrawal symptoms: irritable mood. He rates his stage of change as: 10 READY: Ready to set action plan and implement: Would like to quit tomorrow.  Review of Systems  Constitutional: Positive for malaise/fatigue. Negative for fever.       Weight gain  HENT: Negative for nosebleeds and tinnitus.   Eyes: Positive for blurred vision.  Respiratory:  Positive for cough and shortness of breath. Negative for sputum production.   Cardiovascular: Negative for chest pain, palpitations, claudication and leg swelling.  Gastrointestinal: Positive for heartburn. Negative for nausea, vomiting, abdominal pain, diarrhea and constipation.  Genitourinary: Negative for dysuria and frequency.  Musculoskeletal: Positive for joint pain.  Neurological: Positive for weakness and headaches. Negative for dizziness and tingling.  Psychiatric/Behavioral: Positive for depression. Negative for suicidal ideas. The patient is nervous/anxious and has insomnia.    Past Medical History: Patient Active Problem List   Diagnosis Date Noted  . Expected blood loss anemia 08/15/2013  . Obese 08/15/2013  . S/P right hip revision 08/14/2013  . Preoperative evaluation to rule out surgical contraindication 07/02/2013  . History of total hip arthroplasty 01/29/2013  . Left ankle pain 05/24/2012  . CVA (cerebral infarction) 04/11/2012  . Pain in joint, pelvic region and thigh 03/02/2012  . Nephrolithiasis 10/30/2011  . Septic hip (HCC) 12/28/2010  . Other chronic pain 04/02/2010  . GERD 04/02/2010  . Chronic kidney disease (CKD), stage II (mild) 07/22/2009  . BENIGN PROSTATIC HYPERTROPHY, WITH OBSTRUCTION 07/19/2009  . INSOMNIA 06/20/2009  . HYPERTRIGLYCERIDEMIA 05/22/2008  . Depression 11/18/2007  . ATRIAL SEPTAL DEFECT 11/18/2007  . Testosterone deficiency 09/12/2007  . TOBACCO ABUSE 01/20/2007  . Essential hypertension 01/20/2007  . BACK PAIN, CHRONIC 01/20/2007  . AVASCULAR NECROSIS, FEMORAL HEAD 01/20/2007   Medications: reviewed and updated  Social history:  reports that he has been smoking Cigarettes.  He has a 16 pack-year smoking history. He has never used smokeless tobacco. Has been smoking about 2 PPD    Objective:  BP 167/107 mmHg  Pulse 89  Temp(Src) 98.4 F (36.9 C) (Oral)  Wt 244 lb (110.678 kg) Physical Exam  Gen: NAD, alert, cooperative  with exam, well-appearing CV: Regular rate and rhythm, good S1/S2, no murmur, no edema, capillary refill brisk  Resp: non-labored breathing, some scattered expiratory wheezing in bilateral lung fields, no crackles Skin: no rashes, normal turgor  Psych: good insight, alert and oriented. No suicidal/homicidal ideations    Assessment & Plan:  Essential hypertension Uncontrolled hypertension despite medication compliance. BP initially 180/113 on arrival, 167/107 when re-checked in room. - Increase Coreg to 25 mg BID - Continue Amlodipine 10 mg daily - Obtained lab work today: CBC, CMP, Testosterone, and fasting Lipid Panel - Will follow up with Dr. Caleb PoppNettey on 10/08/15   GERD Uncontrolled. Has been out of PPI for the last couple weeks.  - Refilled Pantoprazole 40 mg daily  TOBACCO ABUSE Currently using 2 PPD. Is motivated and ready to quit. Quit date set to 09/18/15. Chantix has helped in the past.  - Restarted Chantix  Testosterone deficiency Patient has not been adherent to treatment. Last dose of Testosterone was given in March and patient never followed up.  - Will not administer testosterone injection today as his current level is unknown. Additionally there are risk factors for smokers and increased risk of prostate cancer - Testosterone level taken in lab today - Will follow up with Dr. Joya MartyrNettey     Christina Gambino, MD [facility], PGY-1

## 2015-09-17 NOTE — Assessment & Plan Note (Addendum)
Uncontrolled hypertension despite medication compliance. BP initially 180/113 on arrival, 167/107 when re-checked in room. - Increase Coreg to 25 mg BID - Continue Amlodipine 10 mg daily - Obtained lab work today: CBC, CMP, Testosterone, and fasting Lipid Panel - Will follow up with Dr. Caleb PoppNettey on 10/08/15

## 2015-09-17 NOTE — Assessment & Plan Note (Signed)
Currently using 2 PPD. Is motivated and ready to quit. Quit date set to 09/18/15. Chantix has helped in the past.  - Restarted Chantix

## 2015-09-17 NOTE — Patient Instructions (Addendum)
Thank you for coming in today, it was nice to meet you!  Today we discussed your low testosterone, high blood pressure, and smoking.  I would like you to get lab work today. Please come back in 3 weeks to re-check your blood pressure and to see Dr. Caleb Popp.  We are also going to increase your blood pressure medication (coreg) to 25 mg twice daily because your blood pressure is very high.  You can also restart Chantix, I will send over another prescription. You will need to take the medication as follows: Initial: Days 1 to 3: 0.5 mg once daily, Days 4 to 7: 0.5 mg twice daily, Day 8: 1 mg twice daily for 11 weeks    If you have any questions or concerns, please do not hesitate to call the office at [phone].  Sincerely,  Anders Simmonds, MD   Smoking Cessation, Tips for Success If you are ready to quit smoking, congratulations! You have chosen to help yourself be healthier. Cigarettes bring nicotine, tar, carbon monoxide, and other irritants into your body. Your lungs, heart, and blood vessels will be able to work better without these poisons. There are many different ways to quit smoking. Nicotine gum, nicotine patches, a nicotine inhaler, or nicotine nasal spray can help with physical craving. Hypnosis, support groups, and medicines help break the habit of smoking. WHAT THINGS CAN I DO TO MAKE QUITTING EASIER?  Here are some tips to help you quit for good:  Pick a date when you will quit smoking completely. Tell all of your friends and family about your plan to quit on that date.  Do not try to slowly cut down on the number of cigarettes you are smoking. Pick a quit date and quit smoking completely starting on that day.  Throw away all cigarettes.   Clean and remove all ashtrays from your home, work, and car.  On a card, write down your reasons for quitting. Carry the card with you and read it when you get the urge to smoke.  Cleanse your body of nicotine. Drink enough water  and fluids to keep your urine clear or pale yellow. Do this after quitting to flush the nicotine from your body.  Learn to predict your moods. Do not let a bad situation be your excuse to have a cigarette. Some situations in your life might tempt you into wanting a cigarette.  Never have "just one" cigarette. It leads to wanting another and another. Remind yourself of your decision to quit.  Change habits associated with smoking. If you smoked while driving or when feeling stressed, try other activities to replace smoking. Stand up when drinking your coffee. Brush your teeth after eating. Sit in a different chair when you read the paper. Avoid alcohol while trying to quit, and try to drink fewer caffeinated beverages. Alcohol and caffeine may urge you to smoke.  Avoid foods and drinks that can trigger a desire to smoke, such as sugary or spicy foods and alcohol.  Ask people who smoke not to smoke around you.  Have something planned to do right after eating or having a cup of coffee. For example, plan to take a walk or exercise.  Try a relaxation exercise to calm you down and decrease your stress. Remember, you may be tense and nervous for the first 2 weeks after you quit, but this will pass.  Find new activities to keep your hands busy. Play with a pen, coin, or rubber band. Doodle or draw  things on paper.  Brush your teeth right after eating. This will help cut down on the craving for the taste of tobacco after meals. You can also try mouthwash.   Use oral substitutes in place of cigarettes. Try using lemon drops, carrots, cinnamon sticks, or chewing gum. Keep them handy so they are available when you have the urge to smoke.  When you have the urge to smoke, try deep breathing.  Designate your home as a nonsmoking area.  If you are a heavy smoker, ask your health care provider about a prescription for nicotine chewing gum. It can ease your withdrawal from nicotine.  Reward yourself. Set  aside the cigarette money you save and buy yourself something nice.  Look for support from others. Join a support group or smoking cessation program. Ask someone at home or at work to help you with your plan to quit smoking.  Always ask yourself, "Do I need this cigarette or is this just a reflex?" Tell yourself, "Today, I choose not to smoke," or "I do not want to smoke." You are reminding yourself of your decision to quit.  Do not replace cigarette smoking with electronic cigarettes (commonly called e-cigarettes). The safety of e-cigarettes is unknown, and some may contain harmful chemicals.  If you relapse, do not give up! Plan ahead and think about what you will do the next time you get the urge to smoke. HOW WILL I FEEL WHEN I QUIT SMOKING? You may have symptoms of withdrawal because your body is used to nicotine (the addictive substance in cigarettes). You may crave cigarettes, be irritable, feel very hungry, cough often, get headaches, or have difficulty concentrating. The withdrawal symptoms are only temporary. They are strongest when you first quit but will go away within 10-14 days. When withdrawal symptoms occur, stay in control. Think about your reasons for quitting. Remind yourself that these are signs that your body is healing and getting used to being without cigarettes. Remember that withdrawal symptoms are easier to treat than the major diseases that smoking can cause.  Even after the withdrawal is over, expect periodic urges to smoke. However, these cravings are generally short lived and will go away whether you smoke or not. Do not smoke! WHAT RESOURCES ARE AVAILABLE TO HELP ME QUIT SMOKING? Your health care provider can direct you to community resources or hospitals for support, which may include:  Group support.  Education.  Hypnosis.  Therapy.   This information is not intended to replace advice given to you by your health care provider. Make sure you discuss any  questions you have with your health care provider.   Document Released: 06/19/2004 Document Revised: 10/12/2014 Document Reviewed: 03/09/2013 Elsevier Interactive Patient Education [employer]2016 Elsevier Inc.

## 2015-09-18 ENCOUNTER — Encounter: Payer: Self-pay | Admitting: Family Medicine

## 2015-09-18 LAB — CBC WITH DIFFERENTIAL/PLATELET
BASOS ABS: 0 10*3/uL (ref 0.0–0.1)
BASOS PCT: 0 % (ref 0–1)
EOS PCT: 4 % (ref 0–5)
Eosinophils Absolute: 0.3 10*3/uL (ref 0.0–0.7)
HEMATOCRIT: 57.3 % — AB (ref 39.0–52.0)
HEMOGLOBIN: 20.1 g/dL — AB (ref 13.0–17.0)
LYMPHS PCT: 22 % (ref 12–46)
Lymphs Abs: 1.6 10*3/uL (ref 0.7–4.0)
MCH: 35.8 pg — ABNORMAL HIGH (ref 26.0–34.0)
MCHC: 35.1 g/dL (ref 30.0–36.0)
MCV: 102.1 fL — ABNORMAL HIGH (ref 78.0–100.0)
MPV: 11.9 fL (ref 8.6–12.4)
Monocytes Absolute: 0.9 10*3/uL (ref 0.1–1.0)
Monocytes Relative: 12 % (ref 3–12)
NEUTROS ABS: 4.5 10*3/uL (ref 1.7–7.7)
Neutrophils Relative %: 62 % (ref 43–77)
Platelets: 194 10*3/uL (ref 150–400)
RBC: 5.61 MIL/uL (ref 4.22–5.81)
RDW: 13 % (ref 11.5–15.5)
WBC: 7.3 10*3/uL (ref 4.0–10.5)

## 2015-09-18 LAB — COMPREHENSIVE METABOLIC PANEL
ALK PHOS: 79 U/L (ref 40–115)
ALT: 40 U/L (ref 9–46)
AST: 35 U/L (ref 10–35)
Albumin: 4.6 g/dL (ref 3.6–5.1)
BILIRUBIN TOTAL: 0.8 mg/dL (ref 0.2–1.2)
BUN: 13 mg/dL (ref 7–25)
CALCIUM: 9.8 mg/dL (ref 8.6–10.3)
CO2: 25 mmol/L (ref 20–31)
CREATININE: 1.04 mg/dL (ref 0.70–1.33)
Chloride: 98 mmol/L (ref 98–110)
GLUCOSE: 93 mg/dL (ref 65–99)
Potassium: 3.8 mmol/L (ref 3.5–5.3)
SODIUM: 137 mmol/L (ref 135–146)
Total Protein: 7.3 g/dL (ref 6.1–8.1)

## 2015-09-18 LAB — LIPID PANEL
Cholesterol: 222 mg/dL — ABNORMAL HIGH (ref 125–200)
HDL: 46 mg/dL (ref >=40)
LDL CALC: 111 mg/dL (ref <130)
Total CHOL/HDL Ratio: 4.8 Ratio (ref <=5.0)
Triglycerides: 327 mg/dL — ABNORMAL HIGH (ref <150)
VLDL: 65 mg/dL — ABNORMAL HIGH (ref <30)

## 2015-09-18 LAB — TESTOSTERONE: Testosterone: 516 ng/dL (ref 300–890)

## 2015-10-08 ENCOUNTER — Ambulatory Visit: Payer: Medicare Other | Admitting: Family Medicine

## 2016-01-06 ENCOUNTER — Other Ambulatory Visit: Payer: Self-pay | Admitting: Family Medicine

## 2016-04-28 ENCOUNTER — Other Ambulatory Visit: Payer: Self-pay | Admitting: *Deleted

## 2016-04-28 MED ORDER — PANTOPRAZOLE SODIUM 40 MG PO TBEC: DELAYED_RELEASE_TABLET | ORAL | 3 refills | Status: DC

## 2016-06-01 ENCOUNTER — Other Ambulatory Visit: Payer: Self-pay | Admitting: *Deleted

## 2016-06-02 MED ORDER — CARVEDILOL 25 MG PO TABS: ORAL_TABLET | ORAL | 3 refills | Status: DC

## 2016-08-25 ENCOUNTER — Other Ambulatory Visit: Payer: Self-pay | Admitting: Family Medicine

## 2016-09-21 ENCOUNTER — Other Ambulatory Visit: Payer: Self-pay | Admitting: Family Medicine

## 2016-10-06 ENCOUNTER — Ambulatory Visit: Payer: Medicare Other | Admitting: Family

## 2016-10-08 ENCOUNTER — Ambulatory Visit: Payer: Medicare Other | Admitting: Family

## 2016-11-25 ENCOUNTER — Ambulatory Visit (INDEPENDENT_AMBULATORY_CARE_PROVIDER_SITE_OTHER): Payer: PPO | Admitting: Family

## 2016-11-25 ENCOUNTER — Encounter: Payer: Self-pay | Admitting: Family

## 2016-11-25 ENCOUNTER — Other Ambulatory Visit (INDEPENDENT_AMBULATORY_CARE_PROVIDER_SITE_OTHER): Payer: PPO

## 2016-11-25 VITALS — BP 176/120 | HR 98 | Temp 98.8°F | Resp 16 | Ht 71.0 in | Wt 248.0 lb

## 2016-11-25 DIAGNOSIS — G8929 Other chronic pain: Secondary | ICD-10-CM | POA: Diagnosis not present

## 2016-11-25 DIAGNOSIS — Z23 Encounter for immunization: Secondary | ICD-10-CM | POA: Diagnosis not present

## 2016-11-25 DIAGNOSIS — E349 Endocrine disorder, unspecified: Secondary | ICD-10-CM

## 2016-11-25 DIAGNOSIS — K219 Gastro-esophageal reflux disease without esophagitis: Secondary | ICD-10-CM | POA: Diagnosis not present

## 2016-11-25 DIAGNOSIS — I10 Essential (primary) hypertension: Secondary | ICD-10-CM | POA: Diagnosis not present

## 2016-11-25 LAB — CBC
HCT: 47 % (ref 39.0–52.0)
HEMOGLOBIN: 16.5 g/dL (ref 13.0–17.0)
MCHC: 35.1 g/dL (ref 30.0–36.0)
MCV: 104.6 fl — ABNORMAL HIGH (ref 78.0–100.0)
Platelets: 239 10*3/uL (ref 150.0–400.0)
RBC: 4.49 Mil/uL (ref 4.22–5.81)
RDW: 12 % (ref 11.5–15.5)
WBC: 9.7 10*3/uL (ref 4.0–10.5)

## 2016-11-25 LAB — COMPREHENSIVE METABOLIC PANEL
ALBUMIN: 4.2 g/dL (ref 3.5–5.2)
ALT: 89 U/L — ABNORMAL HIGH (ref 0–53)
AST: 64 U/L — AB (ref 0–37)
Alkaline Phosphatase: 90 U/L (ref 39–117)
BUN: 16 mg/dL (ref 6–23)
CALCIUM: 10.1 mg/dL (ref 8.4–10.5)
CHLORIDE: 102 meq/L (ref 96–112)
CO2: 27 mEq/L (ref 19–32)
CREATININE: 1.12 mg/dL (ref 0.40–1.50)
GFR: 72.18 mL/min (ref >60.00)
Glucose, Bld: 123 mg/dL — ABNORMAL HIGH (ref 70–99)
Potassium: 3.3 mEq/L — ABNORMAL LOW (ref 3.5–5.1)
Sodium: 137 mEq/L (ref 135–145)
Total Bilirubin: 0.7 mg/dL (ref 0.2–1.2)
Total Protein: 7.2 g/dL (ref 6.0–8.3)

## 2016-11-25 LAB — LIPID PANEL
CHOL/HDL RATIO: 4
Cholesterol: 242 mg/dL — ABNORMAL HIGH (ref 0–200)
HDL: 55.4 mg/dL (ref >39.00)
LDL Cholesterol: 153 mg/dL — ABNORMAL HIGH (ref 0–99)
NonHDL: 186.78
TRIGLYCERIDES: 169 mg/dL — AB (ref 0.0–149.0)
VLDL: 33.8 mg/dL (ref 0.0–40.0)

## 2016-11-25 LAB — TESTOSTERONE: TESTOSTERONE: 188.53 ng/dL — AB (ref 300.00–890.00)

## 2016-11-25 LAB — HEMOGLOBIN A1C: HEMOGLOBIN A1C: 5.4 % (ref 4.6–6.5)

## 2016-11-25 MED ORDER — AMLODIPINE BESYLATE 10 MG PO TABS: 10.0000 mg | ORAL_TABLET | Freq: Every day | ORAL | 2 refills | Status: DC

## 2016-11-25 NOTE — Patient Instructions (Signed)
Thank you for choosing [facility].  SUMMARY AND INSTRUCTIONS:  We can send a referral to pain management if needed.   Monitor your blood pressure at home.  Follow up in 2 weeks for blood pressure check as nurse visit.   Medication:  Please continue to take your medication as prescribed.   Restart your amlodipine.  Start the sample of Anoro.   Your prescription(s) have been submitted to your pharmacy or been printed and provided for you. Please take as directed and contact our office if you believe you are having problem(s) with the medication(s) or have any questions.  Labs:  Please stop by the lab on the lower level of the building for your blood work. Your results will be released to MyChart (or called to you) after review, usually within 72 hours after test completion. If any changes need to be made, you will be notified at that same time.  1.) The lab is open from 7:30am to 5:30 pm Monday-Friday 2.) No appointment is necessary 3.) Fasting (if needed) is 6-8 hours after food and drink; black coffee and water are okay    Follow up:  If your symptoms worsen or fail to improve, please contact our office for further instruction, or in case of emergency go directly to the emergency room at the closest medical facility.   DASH Eating Plan DASH stands for "Dietary Approaches to Stop Hypertension." The DASH eating plan is a healthy eating plan that has been shown to reduce high blood pressure (hypertension). Additional health benefits may include reducing the risk of type 2 diabetes mellitus, heart disease, and stroke. The DASH eating plan may also help with weight loss. What do I need to know about the DASH eating plan? For the DASH eating plan, you will follow these general guidelines:  Choose foods with less than 150 milligrams of sodium per serving (as listed on the food label).  Use salt-free seasonings or herbs instead of table salt or sea salt.  Check with your  health care provider or pharmacist before using salt substitutes.  Eat lower-sodium products. These are often labeled as "low-sodium" or "no salt added."  Eat fresh foods. Avoid eating a lot of canned foods.  Eat more vegetables, fruits, and low-fat dairy products.  Choose whole grains. Look for the word "whole" as the first word in the ingredient list.  Choose fish and skinless chicken or [place] more often than red meat. Limit fish, poultry, and meat to 6 oz (170 g) each day.  Limit sweets, desserts, sugars, and sugary drinks.  Choose heart-healthy fats.  Eat more home-cooked food and less restaurant, buffet, and fast food.  Limit fried foods.  Do not fry foods. Cook foods using methods such as baking, boiling, grilling, and broiling instead.  When eating at a restaurant, ask that your food be prepared with less salt, or no salt if possible. What foods can I eat? Seek help from a dietitian for individual calorie needs. Grains  Whole grain or whole wheat bread. Brown rice. Whole grain or whole wheat pasta. Quinoa, bulgur, and whole grain cereals. Low-sodium cereals. Corn or whole wheat flour tortillas. Whole grain cornbread. Whole grain crackers. Low-sodium crackers. Vegetables  Fresh or frozen vegetables (raw, steamed, roasted, or grilled). Low-sodium or reduced-sodium tomato and vegetable juices. Low-sodium or reduced-sodium tomato sauce and paste. Low-sodium or reduced-sodium canned vegetables. Fruits  All fresh, canned (in natural juice), or frozen fruits. Meat and Other Protein Products  Ground beef (85% or leaner),  grass-fed beef, or beef trimmed of fat. Skinless chicken or [place]. Ground chicken or [place]. Pork trimmed of fat. All fish and seafood. Eggs. Dried beans, peas, or lentils. Unsalted nuts and seeds. Unsalted canned beans. Dairy  Low-fat dairy products, such as skim or 1% milk, 2% or reduced-fat cheeses, low-fat ricotta or cottage cheese, or plain low-fat yogurt.  Low-sodium or reduced-sodium cheeses. Fats and Oils  Tub margarines without trans fats. Light or reduced-fat mayonnaise and salad dressings (reduced sodium). Avocado. Safflower, olive, or canola oils. Natural peanut or almond butter. Other  Unsalted popcorn and pretzels. The items listed above may not be a complete list of recommended foods or beverages. Contact your dietitian for more options.  What foods are not recommended? Grains  White bread. White pasta. White rice. Refined cornbread. Bagels and croissants. Crackers that contain trans fat. Vegetables  Creamed or fried vegetables. Vegetables in a cheese sauce. Regular canned vegetables. Regular canned tomato sauce and paste. Regular tomato and vegetable juices. Fruits  Canned fruit in light or heavy syrup. Fruit juice. Meat and Other Protein Products  Fatty cuts of meat. Ribs, chicken wings, bacon, sausage, bologna, salami, chitterlings, fatback, hot dogs, bratwurst, and packaged luncheon meats. Salted nuts and seeds. Canned beans with salt. Dairy  Whole or 2% milk, cream, half-and-half, and cream cheese. Whole-fat or sweetened yogurt. Full-fat cheeses or blue cheese. Nondairy creamers and whipped toppings. Processed cheese, cheese spreads, or cheese curds. Condiments  Onion and garlic salt, seasoned salt, table salt, and sea salt. Canned and packaged gravies. Worcestershire sauce. Tartar sauce. Barbecue sauce. Teriyaki sauce. Soy sauce, including reduced sodium. Steak sauce. Fish sauce. Oyster sauce. Cocktail sauce. Horseradish. Ketchup and mustard. Meat flavorings and tenderizers. Bouillon cubes. Hot sauce. Tabasco sauce. Marinades. Taco seasonings. Relishes. Fats and Oils  Butter, stick margarine, lard, shortening, ghee, and bacon fat. Coconut, palm kernel, or palm oils. Regular salad dressings. Other  Pickles and olives. Salted popcorn and pretzels. The items listed above may not be a complete list of foods and beverages to avoid.  Contact your dietitian for more information.  Where can I find more information? National Heart, Lung, and Blood Institute: CableProm[employer].itwww.nhlbi.nih.gov/health/health-topics/topics/dash/ This information is not intended to replace advice given to you by your health care provider. Make sure you discuss any questions you have with your health care provider. Document Released: 09/10/2011 Document Revised: 02/27/2016 Document Reviewed: 07/26/2013 Elsevier Interactive Patient Education  2017 [employer]

## 2016-11-25 NOTE — Assessment & Plan Note (Signed)
Symptoms are chronic pain associated with previous avascular necrosis of bilateral hips with replacement and revisions as well as new onset avascular necrosis of the left ankle with recommended surgery. Patient was discharged from most recent pain management practice secondary to having marijuana during a urine drug screen. Currently working on new referral to pain management and awaiting admission. Denies referral at this time. Continue to monitor.

## 2016-11-25 NOTE — Assessment & Plan Note (Signed)
Previously noted to be on testosterone therapy and per notes was on the max dose of testosterone replacement with potential referral to endocrinology. Obtain testosterone and CBC. Follow-up pending blood work results and possible reinitiation of therapy or referral to endocrinology.

## 2016-11-25 NOTE — Progress Notes (Signed)
Subjective:    Patient ID: Jeffrey PickettBoyd R Stough Jr., male    DOB: 05/09/1961, 56 y.o.   MRN: [id number]  Chief Complaint  Patient presents with  . Establish Care    Blood pressure    HPI:  Jeffrey PickettBoyd R Kneale Jr. is a 56 y.o. male who  has a past medical history of Allergy; Arthritis; Closed fracture of lateral malleolus (01/04/2008); Complication of anesthesia; Degenerative disc disease, lumbar; Depression; GERD (gastroesophageal reflux disease); Heart murmur; Hyperlipidemia; Hypertension; Kidney calculus; Peripheral vascular disease (HCC); Presence of IVC filter; Sleep apnea; and Stroke (HCC) (2004). and presents today for an office visit to establish care.   1.) Hypertension - Currently maintained on carvediolol. Reports taking the medication as prescribed and denies adverse side effects. Does not currently take his blood pressure at home. Denies worst headache of life with no symptoms of end organ damage. Not currently following a low sodium diet. Exercise is limited secondary to multiple joint pains.  BP Readings from Last 3 Encounters:  11/25/16 (!) 176/120  09/17/15 (!) 167/107  12/10/14 (!) 150/100   2.) GERD - Currently maintained on pantoprazole. Reports taking the medication as prescribed and denies adverse side effects. Symptoms are generally well controlled with the current medication regimen.  3.) Multiple joint pain - Previously managed by pain medicine as he has significant history of avascular necrosis in his bilateral hips which required a total hip replacement bilaterally. He has also had revisions completed secondary to healing and infection. He has also noted that there is new avascular necrosis in his left ankle which he is currently working with orthopedics for the left ankle. Managed previously through [facility] before he was discharged secondary to having marijuana within his system. Currently seeking additional pain management group.  Allergies  Allergen  Reactions  . Ace Inhibitors     Possible ACEi induced renal failure.   [name]. Hctz [Hydrochlorothiazide]     Thiazide induced hypercalcemia   . Lipitor [Atorvastatin] Nausea And Vomiting  . Penicillins     REACTION: hives      Outpatient Medications Prior to Visit  Medication Sig Dispense Refill  . carvedilol (COREG) 25 MG tablet TAKE 1 TABLET (25 MG TOTAL) BY MOUTH 2 (TWO) TIMES DAILY WITH A MEAL. 60 tablet 3  . pantoprazole (PROTONIX) 40 MG tablet TAKE 1 TABLET (40 MG TOTAL) BY MOUTH DAILY. 30 tablet 3  . acetaminophen (TYLENOL) 325 MG tablet Take 325 mg by mouth every 6 (six) hours as needed (pain).    [name]. amLODipine (NORVASC) 10 MG tablet Take 1 tablet (10 mg total) by mouth daily. 30 tablet 2  . citalopram (CELEXA) 20 MG tablet TAKE ONE TABLET BY MOUTH TWICE DAILY 60 tablet 0  . doxazosin (CARDURA) 4 MG tablet TAKE 1 TABLET (4 MG TOTAL) BY MOUTH AT BEDTIME. (Patient not taking: Reported on 09/17/2015) 30 tablet 11  . ibuprofen (ADVIL,MOTRIN) 800 MG tablet TAKE ONE TABLET BY MOUTH EVERY 8 HOURS AS NEEDED (Patient not taking: Reported on 09/17/2015) 30 tablet 0  . naphazoline (CLEAR EYES) 0.012 % ophthalmic solution Place 2 drops into both eyes daily.    [name]. testosterone cypionate (DEPOTESTOTERONE CYPIONATE) 200 MG/ML injection Inject 2 mLs (400 mg total) into the muscle every 14 (fourteen) days. (Patient not taking: Reported on 09/17/2015) 10 mL 5  . varenicline (CHANTIX CONTINUING MONTH PAK) 1 MG tablet Take 1 tablet (1 mg total) by mouth 2 (two) times daily. Start taking on 12/17/2014 (Patient not taking: Reported  on 09/17/2015) 60 tablet 2   No facility-administered medications prior to visit.      Past Medical History:  Diagnosis Date  . Allergy   . Arthritis   . Closed fracture of lateral malleolus 01/04/2008   Qualifier: History of  By: Sharen Hones  MD, Wynona Canes    . Complication of anesthesia    "problems with my breathing- states was told had sleep apnea, but not ever tested"  .  Degenerative disc disease, lumbar   . Depression   . GERD (gastroesophageal reflux disease)   . Heart murmur    has hole in his heart  . Hyperlipidemia   . Hypertension   . Kidney calculus   . Peripheral vascular disease (HCC)    DVT 2004-  left leg  . Presence of IVC filter    right femoral  . Sleep apnea    undiagnosed  . Stroke [facility]) 2004   patient has residual numbness  rt arm      Past Surgical History:  Procedure Laterality Date  . IVC filter placement Right   . JOINT REPLACEMENT    . TOTAL HIP ARTHROPLASTY Bilateral    left x 2; right x 1  . TOTAL HIP REVISION Right 08/14/2013   Procedure: RIGHT TOTAL HIP REVISION;  Surgeon: [name], MD;  Location: [facility];  Service: Orthopedics;  Laterality: Right;  . Trochanteric bursa injection Left 03/01/13   [facility], Dr. Ardell Isaacs      Family History  Problem Relation Age of Onset  . Arthritis Mother   . Heart disease Mother   . Hypertension Mother       Social History   Social History  . Marital status: Divorced    Spouse name: N/A  . Number of children: 0  . Years of education: 12   Occupational History  . Disability    Social History Main Topics  . Smoking status: Current Every Day Smoker    Packs/day: 1.00    Years: 39.00    Types: Cigarettes    Last attempt to quit: 07/16/2013  . Smokeless tobacco: Never Used     Comment: Recently smoking 2 PPD  . Alcohol use No     Comment: Every couple of months  . Drug use: No  . Sexual activity: No   Other Topics Concern  . Not on file   Social History Narrative  . No narrative on file      Review of Systems  Constitutional: Negative for chills and fever.  Eyes:       Negative for changes in vision  Respiratory: Negative for cough, chest tightness and wheezing.   Cardiovascular: Negative for chest pain, palpitations and leg swelling.  Gastrointestinal: Negative for abdominal pain, blood in stool, constipation, diarrhea,  nausea and vomiting.  Neurological: Negative for dizziness, weakness and light-headedness.       Objective:    BP (!) 176/120 (BP Location: Left Arm, Patient Position: Sitting, Cuff Size: Large)   Pulse 98   Temp 98.8 F (37.1 C) (Oral)   Resp 16   Ht 5\' 11"  (1.803 m)   Wt 248 lb (112.5 kg)   SpO2 97%   BMI 34.59 kg/m  Nursing note and vital signs reviewed.  Physical Exam  Constitutional: He is oriented to person, place, and time. He appears well-developed and well-nourished. No distress.  Ambulates with a cane.   Cardiovascular: Normal rate, regular rhythm, normal heart sounds and intact distal pulses.  Exam  reveals no gallop and no friction rub.   No murmur heard. Pulmonary/Chest: Effort normal. No respiratory distress. He has wheezes. He has no rales. He exhibits no tenderness.  Musculoskeletal:  Bilateral hips - no obvious deformity, discoloration, or edema. There is slight decreased range of motion bilaterally with adequate strength. Distal pulses and sensation are intact and appropriate.  Left ankle - no obvious deformity or discoloration with mild/moderate edema of the medial aspect. Range of motion limited secondary to discomfort. There is tenderness on the medial and lateral sides. Ligamentous testing deferred secondary to discomfort.  Neurological: He is alert and oriented to person, place, and time.  Skin: Skin is warm and dry.  Psychiatric: He has a normal mood and affect. His behavior is normal. Judgment and thought content normal.        Assessment & Plan:   Problem List Items Addressed This Visit      Cardiovascular and Mediastinum   Essential hypertension - Primary    Blood pressure remains elevated above goal 140/90 with current medication regimen. Restart amlodipine. Continue current dosage of carvedilol. Encouraged to monitor blood pressure at home as able. Follow low-sodium diet. Follow-up in 2 weeks for nurse visit to recheck blood pressure. Denies worst  headache of life with no new symptoms of end organ damage noted on physical exam.      Relevant Medications   amLODipine (NORVASC) 10 MG tablet   Other Relevant Orders   Comprehensive metabolic panel (Completed)   Hemoglobin A1c (Completed)   Lipid Profile (Completed)     Digestive   GERD    Gastroesophageal reflux appears adequately controlled current dosage of pantoprazole with no adverse side effects. Continue current dosage of pantoprazole and continue to monitor.        Other   Testosterone deficiency    Previously noted to be on testosterone therapy and per notes was on the max dose of testosterone replacement with potential referral to endocrinology. Obtain testosterone and CBC. Follow-up pending blood work results and possible reinitiation of therapy or referral to endocrinology.      Relevant Orders   Testosterone (Completed)   CBC (Completed)   Other chronic pain    Symptoms are chronic pain associated with previous avascular necrosis of bilateral hips with replacement and revisions as well as new onset avascular necrosis of the left ankle with recommended surgery. Patient was discharged from most recent pain management practice secondary to having marijuana during a urine drug screen. Currently working on new referral to pain management and awaiting admission. Denies referral at this time. Continue to monitor.       Other Visit Diagnoses    Encounter for immunization       Relevant Orders   Flu Vaccine QUAD 36+ mos IM (Completed)   Tdap vaccine greater than or equal to 7yo IM (Completed)   Need for diphtheria-tetanus-pertussis (Tdap) vaccine, adult/adolescent       Relevant Orders   Tdap vaccine greater than or equal to 7yo IM (Completed)       I have discontinued Mr. Manson's naphazoline, acetaminophen, doxazosin, ibuprofen, varenicline, testosterone cypionate, and citalopram. I am also having him maintain his pantoprazole, carvedilol, and amLODipine.   Meds  ordered this encounter  Medications  . amLODipine (NORVASC) 10 MG tablet    Sig: Take 1 tablet (10 mg total) by mouth daily.    Dispense:  30 tablet    Refill:  2    Order Specific Question:   Supervising Provider  Answer:   Hillard Danker A [[id number]]     Follow-up: Return in about 1 month (around 12/23/2016), or if symptoms worsen or fail to improve.  Jeanine Luz, FNP

## 2016-11-25 NOTE — Assessment & Plan Note (Signed)
Blood pressure remains elevated above goal 140/90 with current medication regimen. Restart amlodipine. Continue current dosage of carvedilol. Encouraged to monitor blood pressure at home as able. Follow low-sodium diet. Follow-up in 2 weeks for nurse visit to recheck blood pressure. Denies worst headache of life with no new symptoms of end organ damage noted on physical exam.

## 2016-11-25 NOTE — Assessment & Plan Note (Signed)
Gastroesophageal reflux appears adequately controlled current dosage of pantoprazole with no adverse side effects. Continue current dosage of pantoprazole and continue to monitor.

## 2016-11-30 DIAGNOSIS — M19071 Primary osteoarthritis, right ankle and foot: Secondary | ICD-10-CM | POA: Diagnosis not present

## 2016-11-30 DIAGNOSIS — M85461 Solitary bone cyst, right tibia and fibula: Secondary | ICD-10-CM | POA: Diagnosis not present

## 2016-12-14 DIAGNOSIS — G894 Chronic pain syndrome: Secondary | ICD-10-CM | POA: Diagnosis not present

## 2016-12-14 DIAGNOSIS — G8911 Acute pain due to trauma: Secondary | ICD-10-CM | POA: Diagnosis not present

## 2016-12-14 DIAGNOSIS — M25579 Pain in unspecified ankle and joints of unspecified foot: Secondary | ICD-10-CM | POA: Diagnosis not present

## 2016-12-14 DIAGNOSIS — M25552 Pain in left hip: Secondary | ICD-10-CM | POA: Diagnosis not present

## 2016-12-14 DIAGNOSIS — M545 Low back pain: Secondary | ICD-10-CM | POA: Diagnosis not present

## 2016-12-21 ENCOUNTER — Other Ambulatory Visit: Payer: Self-pay | Admitting: Family Medicine

## 2016-12-22 DIAGNOSIS — M85461 Solitary bone cyst, right tibia and fibula: Secondary | ICD-10-CM | POA: Diagnosis not present

## 2016-12-22 DIAGNOSIS — M19071 Primary osteoarthritis, right ankle and foot: Secondary | ICD-10-CM | POA: Diagnosis not present

## 2016-12-22 DIAGNOSIS — M19072 Primary osteoarthritis, left ankle and foot: Secondary | ICD-10-CM | POA: Diagnosis not present

## 2016-12-22 DIAGNOSIS — S99919A Unspecified injury of unspecified ankle, initial encounter: Secondary | ICD-10-CM | POA: Diagnosis not present

## 2016-12-22 DIAGNOSIS — X58XXXA Exposure to other specified factors, initial encounter: Secondary | ICD-10-CM | POA: Diagnosis not present

## 2017-01-13 DIAGNOSIS — M545 Low back pain: Secondary | ICD-10-CM | POA: Diagnosis not present

## 2017-01-13 DIAGNOSIS — M25552 Pain in left hip: Secondary | ICD-10-CM | POA: Diagnosis not present

## 2017-01-13 DIAGNOSIS — G894 Chronic pain syndrome: Secondary | ICD-10-CM | POA: Diagnosis not present

## 2017-01-13 DIAGNOSIS — M25572 Pain in left ankle and joints of left foot: Secondary | ICD-10-CM | POA: Diagnosis not present

## 2017-01-18 DIAGNOSIS — M85461 Solitary bone cyst, right tibia and fibula: Secondary | ICD-10-CM | POA: Diagnosis not present

## 2017-01-18 DIAGNOSIS — Z720 Tobacco use: Secondary | ICD-10-CM | POA: Diagnosis not present

## 2017-01-18 DIAGNOSIS — M19071 Primary osteoarthritis, right ankle and foot: Secondary | ICD-10-CM | POA: Diagnosis not present

## 2017-01-18 DIAGNOSIS — Z716 Tobacco abuse counseling: Secondary | ICD-10-CM | POA: Diagnosis not present

## 2017-02-10 ENCOUNTER — Other Ambulatory Visit: Payer: Self-pay | Admitting: Family Medicine

## 2017-02-10 DIAGNOSIS — M545 Low back pain: Secondary | ICD-10-CM | POA: Diagnosis not present

## 2017-02-10 DIAGNOSIS — M25552 Pain in left hip: Secondary | ICD-10-CM | POA: Diagnosis not present

## 2017-02-10 DIAGNOSIS — G894 Chronic pain syndrome: Secondary | ICD-10-CM | POA: Diagnosis not present

## 2017-02-10 DIAGNOSIS — M25572 Pain in left ankle and joints of left foot: Secondary | ICD-10-CM | POA: Diagnosis not present

## 2017-03-10 DIAGNOSIS — M25552 Pain in left hip: Secondary | ICD-10-CM | POA: Diagnosis not present

## 2017-03-10 DIAGNOSIS — M545 Low back pain: Secondary | ICD-10-CM | POA: Diagnosis not present

## 2017-03-10 DIAGNOSIS — G894 Chronic pain syndrome: Secondary | ICD-10-CM | POA: Diagnosis not present

## 2017-03-10 DIAGNOSIS — M25572 Pain in left ankle and joints of left foot: Secondary | ICD-10-CM | POA: Diagnosis not present

## 2017-04-14 DIAGNOSIS — M25572 Pain in left ankle and joints of left foot: Secondary | ICD-10-CM | POA: Diagnosis not present

## 2017-04-14 DIAGNOSIS — M545 Low back pain: Secondary | ICD-10-CM | POA: Diagnosis not present

## 2017-04-14 DIAGNOSIS — M25552 Pain in left hip: Secondary | ICD-10-CM | POA: Diagnosis not present

## 2017-04-14 DIAGNOSIS — G894 Chronic pain syndrome: Secondary | ICD-10-CM | POA: Diagnosis not present

## 2017-05-04 ENCOUNTER — Other Ambulatory Visit: Payer: Self-pay | Admitting: Family Medicine

## 2017-05-12 DIAGNOSIS — M25552 Pain in left hip: Secondary | ICD-10-CM | POA: Diagnosis not present

## 2017-05-12 DIAGNOSIS — G894 Chronic pain syndrome: Secondary | ICD-10-CM | POA: Diagnosis not present

## 2017-05-12 DIAGNOSIS — M545 Low back pain: Secondary | ICD-10-CM | POA: Diagnosis not present

## 2017-05-12 DIAGNOSIS — M25572 Pain in left ankle and joints of left foot: Secondary | ICD-10-CM | POA: Diagnosis not present

## 2017-06-11 DIAGNOSIS — M25552 Pain in left hip: Secondary | ICD-10-CM | POA: Diagnosis not present

## 2017-06-11 DIAGNOSIS — G894 Chronic pain syndrome: Secondary | ICD-10-CM | POA: Diagnosis not present

## 2017-06-11 DIAGNOSIS — M25572 Pain in left ankle and joints of left foot: Secondary | ICD-10-CM | POA: Diagnosis not present

## 2017-06-11 DIAGNOSIS — M545 Low back pain: Secondary | ICD-10-CM | POA: Diagnosis not present

## 2017-07-14 DIAGNOSIS — M545 Low back pain: Secondary | ICD-10-CM | POA: Diagnosis not present

## 2017-07-14 DIAGNOSIS — M25552 Pain in left hip: Secondary | ICD-10-CM | POA: Diagnosis not present

## 2017-07-14 DIAGNOSIS — G894 Chronic pain syndrome: Secondary | ICD-10-CM | POA: Diagnosis not present

## 2017-07-14 DIAGNOSIS — M25572 Pain in left ankle and joints of left foot: Secondary | ICD-10-CM | POA: Diagnosis not present

## 2017-08-18 DIAGNOSIS — M25552 Pain in left hip: Secondary | ICD-10-CM | POA: Diagnosis not present

## 2017-08-18 DIAGNOSIS — G894 Chronic pain syndrome: Secondary | ICD-10-CM | POA: Diagnosis not present

## 2017-08-18 DIAGNOSIS — M25572 Pain in left ankle and joints of left foot: Secondary | ICD-10-CM | POA: Diagnosis not present

## 2017-08-18 DIAGNOSIS — M545 Low back pain: Secondary | ICD-10-CM | POA: Diagnosis not present

## 2017-09-20 DIAGNOSIS — G894 Chronic pain syndrome: Secondary | ICD-10-CM | POA: Diagnosis not present

## 2017-09-20 DIAGNOSIS — M25552 Pain in left hip: Secondary | ICD-10-CM | POA: Diagnosis not present

## 2017-09-20 DIAGNOSIS — G2581 Restless legs syndrome: Secondary | ICD-10-CM | POA: Diagnosis not present

## 2017-09-20 DIAGNOSIS — M25572 Pain in left ankle and joints of left foot: Secondary | ICD-10-CM | POA: Diagnosis not present

## 2017-09-20 DIAGNOSIS — M545 Low back pain: Secondary | ICD-10-CM | POA: Diagnosis not present

## 2017-10-22 DIAGNOSIS — M25552 Pain in left hip: Secondary | ICD-10-CM | POA: Diagnosis not present

## 2017-10-22 DIAGNOSIS — M25572 Pain in left ankle and joints of left foot: Secondary | ICD-10-CM | POA: Diagnosis not present

## 2017-10-22 DIAGNOSIS — G2581 Restless legs syndrome: Secondary | ICD-10-CM | POA: Diagnosis not present

## 2017-10-22 DIAGNOSIS — G894 Chronic pain syndrome: Secondary | ICD-10-CM | POA: Diagnosis not present

## 2017-10-22 DIAGNOSIS — Z79891 Long term (current) use of opiate analgesic: Secondary | ICD-10-CM | POA: Diagnosis not present

## 2017-10-22 DIAGNOSIS — M545 Low back pain: Secondary | ICD-10-CM | POA: Diagnosis not present

## 2017-11-17 ENCOUNTER — Ambulatory Visit: Payer: Self-pay | Admitting: *Deleted

## 2017-11-17 NOTE — Telephone Encounter (Signed)
[name] phoned in for an appointment for high blood pressure. He stated he goes to a pain clinic often and when they check his b/p it has been very elevated over the last month. Most recent, 200/170's on 11/22/17 and that they refer him to urgent care.He voiced he can't afford to go to UC or ER. He is on Norvasc and Coreg but stated he stopped taking blood pressure med in January because it no longer worked good since he was taken off an antidepressant. He has had a stroke in the past. He denies any s/sx of high blood pressure at this time. He refused to go to UC and the ER today due to finances. He has an appointment in the morning at PCP.  Reason for Disposition . [1] Systolic BP  >= 200 OR Diastolic >= 120  AND [2] having NO cardiac or neurologic symptoms  Answer Assessment - Initial Assessment Questions 1. BLOOD PRESSURE: "What is the blood pressure?" "Did you take at least two measurements 5 minutes apart?"     190-200/170's It was taken at the pain clinic 11/22/17 2. ONSET: "When did you take your blood pressure?"     11/22/17 at the pain clinic  3. HOW: "How did you obtain the blood pressure?" (e.g., visiting nurse, automatic home BP monitor)     At the pain clinic 4. HISTORY: "Do you have a history of high blood pressure?"    yes 5. MEDICATIONS: "Are you taking any medications for blood pressure?" "Have you missed any doses recently?"     Not taking blood pressure for about 1 month because he felt it didn't help 6. OTHER SYMPTOMS: "Do you have any symptoms?" (e.g., headache, chest pain, blurred vision, difficulty breathing, weakness)    Stated sinus headaches often. He stated he is stressed all the time. 7. PREGNANCY: "Is there any chance you are pregnant?" "When was your last menstrual period?"    na  Protocols used: HIGH BLOOD PRESSURE-A-AH

## 2017-11-18 ENCOUNTER — Encounter: Payer: Self-pay | Admitting: Internal Medicine

## 2017-11-18 ENCOUNTER — Ambulatory Visit (INDEPENDENT_AMBULATORY_CARE_PROVIDER_SITE_OTHER): Payer: Medicare HMO | Admitting: Internal Medicine

## 2017-11-18 VITALS — BP 144/92 | HR 102 | Temp 97.7°F | Ht 71.0 in | Wt 239.0 lb

## 2017-11-18 DIAGNOSIS — G8929 Other chronic pain: Secondary | ICD-10-CM

## 2017-11-18 DIAGNOSIS — Z6830 Body mass index (BMI) 30.0-30.9, adult: Secondary | ICD-10-CM | POA: Diagnosis not present

## 2017-11-18 DIAGNOSIS — Z23 Encounter for immunization: Secondary | ICD-10-CM | POA: Diagnosis not present

## 2017-11-18 DIAGNOSIS — E6609 Other obesity due to excess calories: Secondary | ICD-10-CM

## 2017-11-18 DIAGNOSIS — M545 Low back pain: Secondary | ICD-10-CM | POA: Diagnosis not present

## 2017-11-18 DIAGNOSIS — I10 Essential (primary) hypertension: Secondary | ICD-10-CM

## 2017-11-18 DIAGNOSIS — F339 Major depressive disorder, recurrent, unspecified: Secondary | ICD-10-CM | POA: Diagnosis not present

## 2017-11-18 DIAGNOSIS — R69 Illness, unspecified: Secondary | ICD-10-CM | POA: Diagnosis not present

## 2017-11-18 MED ORDER — PANTOPRAZOLE SODIUM 40 MG PO TBEC: DELAYED_RELEASE_TABLET | ORAL | 11 refills | Status: DC

## 2017-11-18 MED ORDER — B COMPLEX PO TABS: 1.0000 | ORAL_TABLET | Freq: Every day | ORAL | 3 refills | Status: AC

## 2017-11-18 MED ORDER — AMLODIPINE BESYLATE 5 MG PO TABS: 5.0000 mg | ORAL_TABLET | Freq: Every day | ORAL | 11 refills | Status: DC

## 2017-11-18 MED ORDER — CARVEDILOL 25 MG PO TABS: ORAL_TABLET | ORAL | 11 refills | Status: DC

## 2017-11-18 MED ORDER — CITALOPRAM HYDROBROMIDE 20 MG PO TABS: 20.0000 mg | ORAL_TABLET | Freq: Every day | ORAL | 5 refills | Status: DC

## 2017-11-18 MED ORDER — VITAMIN D3 50 MCG (2000 UT) PO CAPS: 2000.0000 [IU] | ORAL_CAPSULE | Freq: Every day | ORAL | 3 refills | Status: AC

## 2017-11-18 NOTE — Assessment & Plan Note (Signed)
Restart Coreg and Norvasc NAS diet, wt loss

## 2017-11-18 NOTE — Assessment & Plan Note (Signed)
Percocet-  Pain Clinic

## 2017-11-18 NOTE — Addendum Note (Signed)
Addended by: Scarlett PrestoFRIEDENBACH, Brook Geraci on: 11/18/2017 12:02 PM   Modules accepted: Orders

## 2017-11-18 NOTE — Assessment & Plan Note (Signed)
Re-start Citalopram

## 2017-11-18 NOTE — Progress Notes (Signed)
Subjective:  Patient ID: Jeffrey PickettBoyd R Eanes Jr., male    DOB: 12/19/1960  Age: 57 y.o. MRN: [id number]  CC: No chief complaint on file.   HPI Jeffrey PickettBoyd R Bua Jr. presents for uncontrolled BP - out of all meds C/o wt gain, depression and chronic pain - Pain Clinic C/o GERD Citalopram helped in the past  Outpatient Medications Prior to Visit  Medication Sig Dispense Refill  . carvedilol (COREG) 25 MG tablet TAKE 1 TABLET (25 MG TOTAL) BY MOUTH 2 (TWO) TIMES DAILY WITH A MEAL. 60 tablet 3  . oxyCODONE-acetaminophen (PERCOCET) 10-325 MG tablet Take 1 tablet by mouth every 4 (four) hours as needed. for pain  0  . pantoprazole (PROTONIX) 40 MG tablet TAKE 1 TABLET (40 MG TOTAL) BY MOUTH DAILY. 30 tablet 3  . amLODipine (NORVASC) 10 MG tablet Take 1 tablet (10 mg total) by mouth daily. 30 tablet 2   No facility-administered medications prior to visit.     ROS Review of Systems  Constitutional: Negative for appetite change, fatigue and unexpected weight change.  HENT: Negative for congestion, nosebleeds, sneezing, sore throat and trouble swallowing.   Eyes: Negative for itching and visual disturbance.  Respiratory: Negative for cough.   Cardiovascular: Negative for chest pain, palpitations and leg swelling.  Gastrointestinal: Negative for abdominal distention, blood in stool, diarrhea and nausea.  Genitourinary: Negative for frequency and hematuria.  Musculoskeletal: Positive for back pain and gait problem. Negative for joint swelling and neck pain.  Skin: Negative for rash.  Neurological: Positive for weakness and numbness. Negative for dizziness, tremors and speech difficulty.  Psychiatric/Behavioral: Negative for agitation, dysphoric mood and sleep disturbance. The patient is not nervous/anxious.     Objective:  BP (!) 144/92 (BP Location: Left Arm, Patient Position: Sitting, Cuff Size: Large)   Pulse (!) 102   Temp 97.7 F (36.5 C) (Oral)   Ht 5\' 11"  (1.803 m)   Wt 239 lb (108.4  kg)   SpO2 95%   BMI 33.33 kg/m   BP Readings from Last 3 Encounters:  11/18/17 (!) 144/92  11/25/16 (!) 176/120  09/17/15 (!) 167/107    Wt Readings from Last 3 Encounters:  11/18/17 239 lb (108.4 kg)  11/25/16 248 lb (112.5 kg)  09/17/15 244 lb (110.7 kg)    Physical Exam  Constitutional: He is oriented to person, place, and time. He appears well-developed. No distress.  NAD  HENT:  Mouth/Throat: Oropharynx is clear and moist.  Eyes: Conjunctivae are normal. Pupils are equal, round, and reactive to light.  Neck: Normal range of motion. No JVD present. No thyromegaly present.  Cardiovascular: Normal rate, regular rhythm, normal heart sounds and intact distal pulses. Exam reveals no gallop and no friction rub.  No murmur heard. Pulmonary/Chest: Effort normal and breath sounds normal. No respiratory distress. He has no wheezes. He has no rales. He exhibits no tenderness.  Abdominal: Soft. Bowel sounds are normal. He exhibits no distension and no mass. There is no tenderness. There is no rebound and no guarding.  Musculoskeletal: Normal range of motion. He exhibits tenderness. He exhibits no edema.  Lymphadenopathy:    He has no cervical adenopathy.  Neurological: He is alert and oriented to person, place, and time. He has normal reflexes. No cranial nerve deficit. He exhibits normal muscle tone. He displays a negative Romberg sign. Coordination abnormal. Gait normal.  Skin: Skin is warm and dry. No rash noted.  Psychiatric: He has a normal mood and affect. His behavior  is normal. Judgment and thought content normal.  Painful hips and LS Cane Obese  Lab Results  Component Value Date   WBC 9.7 11/25/2016   HGB 16.5 11/25/2016   HCT 47.0 11/25/2016   PLT 239.0 11/25/2016   GLUCOSE 123 (H) 11/25/2016   CHOL 242 (H) 11/25/2016   TRIG 169.0 (H) 11/25/2016   HDL 55.40 11/25/2016   LDLDIRECT 93 12/16/2011   LDLCALC 153 (H) 11/25/2016   ALT 89 (H) 11/25/2016   AST 64 (H)  11/25/2016   NA 137 11/25/2016   K 3.3 (L) 11/25/2016   CL 102 11/25/2016   CREATININE 1.12 11/25/2016   BUN 16 11/25/2016   CO2 27 11/25/2016   TSH 2.355 11/08/2014   PSA 1.31 07/19/2009   INR 0.85 08/07/2013   HGBA1C 5.4 11/25/2016    Ct Ankle Left Wo Contrast  Result Date: 01/22/2015 CLINICAL DATA:  Chronic left ankle pain. History of avascular necrosis. EXAM: CT OF THE LEFT ANKLE WITHOUT CONTRAST TECHNIQUE: Multidetector CT imaging of the left ankle was performed according to the standard protocol. Multiplanar CT image reconstructions were also generated. COMPARISON:  CT left ankle 12/13/2013. FINDINGS: Large area of avascular necrosis in the distal tibia including the plafond and is again seen and has worsened. There is a gap at the articular surface of the tibia measuring 3.0 cm AP by 3.7 cm transverse. Almost the entirety of the the talar dome does not articulate with the tibia due to this large gap. There is a cavity in the distal tibia measuring 3.2 cm AP by 3.3 cm craniocaudal by up to 3.8 cm transverse. Multiple necrotic fragments of bone are identified within this cavity. The largest single fragment measures 2.6 cm transverse by 3.3 cm craniocaudal by 1.7 cm AP. There is extensive cortical thickening about the distal tibia likely due to periosteal new bone formation related to stress change. Since the prior examination, there has been development of bridging bone about the patient's medial malleolar fracture. Extensive subchondral cyst formation is identified in the tibia and has worsened since the prior examination most consistent with a combination of tibiotalar osteoarthritis and osteochondral defect formation. All bones appear osteopenic. No acute bony abnormality is identified. Soft tissues demonstrate changes of compatible with synovitis about the tibiotalar joint with or soft tissue thickening. As visualized by CT scan, major tendons about the ankle are intact. Ligaments are not  well seen. IMPRESSION: Extensive osteonecrosis in the distal tibia including the tibial plafond seen on the prior scan appears worsened with increased depression and fragmentation of necrotic bone fragments. Little to none of the talar dome articulates with the tibia due to osteonecrosis. There is now bridging bone about the previously identified medial malleolar fracture. Extensive subchondral cyst formation in the talar dome has progressed since the prior study consistent with worsened degenerative disease. There appears to be synovitis about the tibiotalar joint. Cortical thickening along the distal tibia is likely secondary to buttressing related to stress change. Osteopenia. Electronically Signed   By: Drusilla Kanner M.D.   On: 01/22/2015 16:37    Assessment & Plan:   There are no diagnoses linked to this encounter. I have discontinued Jeffrey [name]. Jeffrey Jr.'s amLODipine. I am also having him maintain his pantoprazole, carvedilol, and oxyCODONE-acetaminophen.  No orders of the defined types were placed in this encounter.    Follow-up: No Follow-up on file.  Sonda Primes, MD

## 2017-11-18 NOTE — Patient Instructions (Signed)

## 2017-11-18 NOTE — Assessment & Plan Note (Signed)
Wt Readings from Last 3 Encounters:  11/18/17 239 lb (108.4 kg)  11/25/16 248 lb (112.5 kg)  09/17/15 244 lb (110.7 kg)

## 2017-11-19 DIAGNOSIS — M545 Low back pain: Secondary | ICD-10-CM | POA: Diagnosis not present

## 2017-11-19 DIAGNOSIS — M25552 Pain in left hip: Secondary | ICD-10-CM | POA: Diagnosis not present

## 2017-11-19 DIAGNOSIS — M25572 Pain in left ankle and joints of left foot: Secondary | ICD-10-CM | POA: Diagnosis not present

## 2017-11-19 DIAGNOSIS — G894 Chronic pain syndrome: Secondary | ICD-10-CM | POA: Diagnosis not present

## 2017-11-19 DIAGNOSIS — G2581 Restless legs syndrome: Secondary | ICD-10-CM | POA: Diagnosis not present

## 2017-11-19 DIAGNOSIS — Z79891 Long term (current) use of opiate analgesic: Secondary | ICD-10-CM | POA: Diagnosis not present

## 2017-12-22 DIAGNOSIS — M545 Low back pain: Secondary | ICD-10-CM | POA: Diagnosis not present

## 2017-12-22 DIAGNOSIS — M25572 Pain in left ankle and joints of left foot: Secondary | ICD-10-CM | POA: Diagnosis not present

## 2017-12-22 DIAGNOSIS — G894 Chronic pain syndrome: Secondary | ICD-10-CM | POA: Diagnosis not present

## 2017-12-22 DIAGNOSIS — M25552 Pain in left hip: Secondary | ICD-10-CM | POA: Diagnosis not present

## 2017-12-22 DIAGNOSIS — G2581 Restless legs syndrome: Secondary | ICD-10-CM | POA: Diagnosis not present

## 2017-12-22 DIAGNOSIS — Z79891 Long term (current) use of opiate analgesic: Secondary | ICD-10-CM | POA: Diagnosis not present

## 2018-01-21 DIAGNOSIS — G2581 Restless legs syndrome: Secondary | ICD-10-CM | POA: Diagnosis not present

## 2018-01-21 DIAGNOSIS — M25552 Pain in left hip: Secondary | ICD-10-CM | POA: Diagnosis not present

## 2018-01-21 DIAGNOSIS — G894 Chronic pain syndrome: Secondary | ICD-10-CM | POA: Diagnosis not present

## 2018-01-21 DIAGNOSIS — M25572 Pain in left ankle and joints of left foot: Secondary | ICD-10-CM | POA: Diagnosis not present

## 2018-01-21 DIAGNOSIS — M545 Low back pain: Secondary | ICD-10-CM | POA: Diagnosis not present

## 2018-01-21 DIAGNOSIS — Z79891 Long term (current) use of opiate analgesic: Secondary | ICD-10-CM | POA: Diagnosis not present

## 2018-02-09 ENCOUNTER — Ambulatory Visit (INDEPENDENT_AMBULATORY_CARE_PROVIDER_SITE_OTHER): Payer: Medicare HMO | Admitting: Nurse Practitioner

## 2018-02-09 ENCOUNTER — Other Ambulatory Visit (INDEPENDENT_AMBULATORY_CARE_PROVIDER_SITE_OTHER): Payer: Medicare HMO

## 2018-02-09 ENCOUNTER — Encounter: Payer: Self-pay | Admitting: Nurse Practitioner

## 2018-02-09 VITALS — BP 162/100 | HR 67 | Temp 98.1°F | Resp 16 | Ht 71.0 in | Wt 240.0 lb

## 2018-02-09 DIAGNOSIS — N400 Enlarged prostate without lower urinary tract symptoms: Secondary | ICD-10-CM

## 2018-02-09 DIAGNOSIS — I10 Essential (primary) hypertension: Secondary | ICD-10-CM | POA: Diagnosis not present

## 2018-02-09 DIAGNOSIS — R300 Dysuria: Secondary | ICD-10-CM

## 2018-02-09 DIAGNOSIS — E559 Vitamin D deficiency, unspecified: Secondary | ICD-10-CM

## 2018-02-09 DIAGNOSIS — K219 Gastro-esophageal reflux disease without esophagitis: Secondary | ICD-10-CM

## 2018-02-09 DIAGNOSIS — Z0001 Encounter for general adult medical examination with abnormal findings: Secondary | ICD-10-CM

## 2018-02-09 DIAGNOSIS — F339 Major depressive disorder, recurrent, unspecified: Secondary | ICD-10-CM | POA: Diagnosis not present

## 2018-02-09 DIAGNOSIS — E781 Pure hyperglyceridemia: Secondary | ICD-10-CM

## 2018-02-09 DIAGNOSIS — Z1159 Encounter for screening for other viral diseases: Secondary | ICD-10-CM

## 2018-02-09 DIAGNOSIS — Z1211 Encounter for screening for malignant neoplasm of colon: Secondary | ICD-10-CM | POA: Diagnosis not present

## 2018-02-09 DIAGNOSIS — R69 Illness, unspecified: Secondary | ICD-10-CM | POA: Diagnosis not present

## 2018-02-09 LAB — URINALYSIS, ROUTINE W REFLEX MICROSCOPIC
BILIRUBIN URINE: NEGATIVE
KETONES UR: NEGATIVE
NITRITE: NEGATIVE
Specific Gravity, Urine: 1.02 (ref 1.000–1.030)
Total Protein, Urine: 30 — AB
Urine Glucose: NEGATIVE
Urobilinogen, UA: 0.2 (ref 0.0–1.0)
pH: 6.5 (ref 5.0–8.0)

## 2018-02-09 LAB — LIPID PANEL
CHOL/HDL RATIO: 4
Cholesterol: 202 mg/dL — ABNORMAL HIGH (ref 0–200)
HDL: 47.9 mg/dL (ref >39.00)
LDL CALC: 120 mg/dL — AB (ref 0–99)
NONHDL: 153.78
Triglycerides: 167 mg/dL — ABNORMAL HIGH (ref 0.0–149.0)
VLDL: 33.4 mg/dL (ref 0.0–40.0)

## 2018-02-09 LAB — COMPREHENSIVE METABOLIC PANEL
ALK PHOS: 65 U/L (ref 39–117)
ALT: 17 U/L (ref 0–53)
AST: 22 U/L (ref 0–37)
Albumin: 4.3 g/dL (ref 3.5–5.2)
BUN: 11 mg/dL (ref 6–23)
CHLORIDE: 99 meq/L (ref 96–112)
CO2: 27 mEq/L (ref 19–32)
Calcium: 9.6 mg/dL (ref 8.4–10.5)
Creatinine, Ser: 0.93 mg/dL (ref 0.40–1.50)
GFR: 89.06 mL/min (ref >60.00)
GLUCOSE: 105 mg/dL — AB (ref 70–99)
POTASSIUM: 4.1 meq/L (ref 3.5–5.1)
SODIUM: 137 meq/L (ref 135–145)
Total Bilirubin: 1 mg/dL (ref 0.2–1.2)
Total Protein: 6.9 g/dL (ref 6.0–8.3)

## 2018-02-09 LAB — CBC
HEMATOCRIT: 45.8 % (ref 39.0–52.0)
HEMOGLOBIN: 16.1 g/dL (ref 13.0–17.0)
MCHC: 35.1 g/dL (ref 30.0–36.0)
MCV: 104.1 fl — ABNORMAL HIGH (ref 78.0–100.0)
PLATELETS: 178 10*3/uL (ref 150.0–400.0)
RBC: 4.4 Mil/uL (ref 4.22–5.81)
RDW: 13.6 % (ref 11.5–15.5)
WBC: 7.7 10*3/uL (ref 4.0–10.5)

## 2018-02-09 LAB — PSA: PSA: 0.72 ng/mL (ref 0.10–4.00)

## 2018-02-09 LAB — VITAMIN D 25 HYDROXY (VIT D DEFICIENCY, FRACTURES): VITD: 31.31 ng/mL (ref 30.00–100.00)

## 2018-02-09 LAB — TSH: TSH: 2.99 u[IU]/mL (ref 0.35–4.50)

## 2018-02-09 LAB — MAGNESIUM: Magnesium: 1.5 mg/dL (ref 1.5–2.5)

## 2018-02-09 MED ORDER — AMLODIPINE BESYLATE 10 MG PO TABS: 10.0000 mg | ORAL_TABLET | Freq: Every day | ORAL | 1 refills | Status: DC

## 2018-02-09 NOTE — Patient Instructions (Addendum)
Please increase your amlodipine to 10 mg once daily. Please continue your carvedilol as you have been taking Please return in about 2 weeks for follow up of your blood pressure.  Please check your insurance for cologuard and let us know if you are covered so we can order the test. If you are not covered, I would highly recommend you have a colonoscopy to screen for colon cancer.  Please head downstairs for lab work/x-rays. If any of your test results are critically abnormal, you will be contacted right away. Your results may be released to your MyChart for viewing before I am able to provide you with my response. I will contact you within a week about your test results and any recommendations for abnormalities.  Please work on healthy diet and exercise as we discussed. Remember half of your plate should be veggies, one-fourth carbs, one-fourth meat, and don't eat meat at every meal. Also, remember to stay away from sugary drinks. I'd like for you to start incorporating exercise into your daily schedule. Start at 10 minutes a day, working up to 30 minutes five times a week.    Health Maintenance, Male A healthy lifestyle and preventive care is important for your health and wellness. Ask your health care provider about what schedule of regular examinations is right for you. What should I know about weight and diet? Eat a Healthy Diet  Eat plenty of vegetables, fruits, whole grains, low-fat dairy products, and lean protein.  Do not eat a lot of foods high in solid fats, added sugars, or salt.  Maintain a Healthy Weight Regular exercise can help you achieve or maintain a healthy weight. You should:  Do at least 150 minutes of exercise each week. The exercise should increase your heart rate and make you sweat (moderate-intensity exercise).  Do strength-training exercises at least twice a week.  Watch Your Levels of Cholesterol and Blood Lipids  Have your blood tested for lipids and  cholesterol every 5 years starting at 57 years of age. If you are at high risk for heart disease, you should start having your blood tested when you are 57 years old. You may need to have your cholesterol levels checked more often if: ? Your lipid or cholesterol levels are high. ? You are older than 57 years of age. ? You are at high risk for heart disease.  What should I know about cancer screening? Many types of cancers can be detected early and may often be prevented. Lung Cancer  You should be screened every year for lung cancer if: ? You are a current smoker who has smoked for at least 30 years. ? You are a former smoker who has quit within the past 15 years.  Talk to your health care provider about your screening options, when you should start screening, and how often you should be screened.  Colorectal Cancer  Routine colorectal cancer screening usually begins at 57 years of age and should be repeated every 5-10 years until you are 57 years old. You may need to be screened more often if early forms of precancerous polyps or small growths are found. Your health care provider may recommend screening at an earlier age if you have risk factors for colon cancer.  Your health care provider may recommend using home test kits to check for hidden blood in the stool.  A small camera at the end of a tube can be used to examine your colon (sigmoidoscopy or colonoscopy). This checks for  the earliest forms of colorectal cancer.  Prostate and Testicular Cancer  Depending on your age and overall health, your health care provider may do certain tests to screen for prostate and testicular cancer.  Talk to your health care provider about any symptoms or concerns you have about testicular or prostate cancer.  Skin Cancer  Check your skin from head to toe regularly.  Tell your health care provider about any new moles or changes in moles, especially if: ? There is a change in a mole's size, shape,  or color. ? You have a mole that is larger than a pencil eraser.  Always use sunscreen. Apply sunscreen liberally and repeat throughout the day.  Protect yourself by wearing long sleeves, pants, a wide-brimmed hat, and sunglasses when outside.  What should I know about heart disease, diabetes, and high blood pressure?  If you are 49-85 years of age, have your blood pressure checked every 3-5 years. If you are 35 years of age or older, have your blood pressure checked every year. You should have your blood pressure measured twice-once when you are at a hospital or clinic, and once when you are not at a hospital or clinic. Record the average of the two measurements. To check your blood pressure when you are not at a hospital or clinic, you can use: ? An automated blood pressure machine at a pharmacy. ? A home blood pressure monitor.  Talk to your health care provider about your target blood pressure.  If you are between 77-50 years old, ask your health care provider if you should take aspirin to prevent heart disease.  Have regular diabetes screenings by checking your fasting blood sugar level. ? If you are at a normal weight and have a low risk for diabetes, have this test once every three years after the age of 58. ? If you are overweight and have a high risk for diabetes, consider being tested at a younger age or more often.  A one-time screening for abdominal aortic aneurysm (AAA) by ultrasound is recommended for men aged 65-75 years who are current or former smokers. What should I know about preventing infection? Hepatitis B If you have a higher risk for hepatitis B, you should be screened for this virus. Talk with your health care provider to find out if you are at risk for hepatitis B infection. Hepatitis C Blood testing is recommended for:  Everyone born from [birth year] through 1965.  Anyone with known risk factors for hepatitis C.  Sexually Transmitted Diseases (STDs)  You should  be screened each year for STDs including gonorrhea and chlamydia if: ? You are sexually active and are younger than 57 years of age. ? You are older than 57 years of age and your health care provider tells you that you are at risk for this type of infection. ? Your sexual activity has changed since you were last screened and you are at an increased risk for chlamydia or gonorrhea. Ask your health care provider if you are at risk.  Talk with your health care provider about whether you are at high risk of being infected with HIV. Your health care provider may recommend a prescription medicine to help prevent HIV infection.  What else can I do?  Schedule regular health, dental, and eye exams.  Stay current with your vaccines (immunizations).  Do not use any tobacco products, such as cigarettes, chewing tobacco, and e-cigarettes. If you need help quitting, ask your health care provider.  Limit  alcohol intake to no more than 2 drinks per day. One drink equals 12 ounces of beer, 5 ounces of wine, or 1 ounces of hard liquor.  Do not use street drugs.  Do not share needles.  Ask your health care provider for help if you need support or information about quitting drugs.  Tell your health care provider if you often feel depressed.  Tell your health care provider if you have ever been abused or do not feel safe at home. This information is not intended to replace advice given to you by your health care provider. Make sure you discuss any questions you have with your health care provider. Document Released: 03/19/2008 Document Revised: 05/20/2016 Document Reviewed: 06/25/2015 Elsevier Interactive Patient Education  [employer].

## 2018-02-09 NOTE — Assessment & Plan Note (Signed)
Noted in past, will update labwork today - VITAMIN D 25 Hydroxy (Vit-D Deficiency, Fractures); Future

## 2018-02-09 NOTE — Assessment & Plan Note (Signed)
Symptomatic, not currently maintained on any medications update labs, F/U with further recommendations pending lab results - PSA; Future

## 2018-02-09 NOTE — Progress Notes (Signed)
Name: Jeffrey [name].   MRN: [id number]    DOB: 27-Feb-1961   Date:02/09/2018       Progress Note  Subjective  Chief Complaint  Chief Complaint  Patient presents with  . Establish Care    CPE, fasting    HPI  Patient presents for annual CPE. He also Follows with pain clinic, orthopedics for chronic pain and osteoarthritis.  USPSTF grade A and B recommendations:  Diet: does not watch his diet, does not cook, often eats sandwiches, drinks water, occasional soda Exercise: not routinely exercising, does routine housework  Depression: maintained on celexa 20, was re-started on this medication for increased depression about 3 months ago Reports daily medication compliance with no noted adverse effects  Reports his mood has improved back on the celexa Denies any thoughts of hurting himself or others Depression screen [facility] 2/9 09/17/2015 12/10/2014 12/10/2014 11/08/2014 06/30/2013  Decreased Interest 0 3 0 3 0  Down, Depressed, Hopeless 0 1 0 3 0  PHQ - 2 Score 0 4 0 6 0  Altered sleeping - 3 - 3 -  Tired, decreased energy - 3 - 3 -  Change in appetite - 1 - 2 -  Feeling bad or failure about yourself  - 2 - 0 -  Trouble concentrating - 3 - 3 -  Moving slowly or fidgety/restless - 1 - 3 -  Suicidal thoughts - 0 - 0 -  PHQ-9 Score - 17 - 20 -  Some recent data might be hidden   Hypertension: maintained on amlodipine 5, carvedilol 25 BID Reports daily medication compliance with no noted adverse effects Does not check BP readings at home BP Readings from Last 3 Encounters:  02/09/18 (!) 162/100  11/18/17 (!) 144/92  11/25/16 (!) 176/120   Obesity: Wt Readings from Last 3 Encounters:  02/09/18 240 lb (108.9 kg)  11/18/17 239 lb (108.4 kg)  11/25/16 248 lb (112.5 kg)   BMI Readings from Last 3 Encounters:  02/09/18 33.47 kg/m  11/18/17 33.33 kg/m  11/25/16 34.59 kg/m    Lipids: h/o hyperlipidemia but off of medications- he says he has been unable to tolerate all cholesterol  medications in the past and does not currently take any cholesterol medication Lab Results  Component Value Date   CHOL 242 (H) 11/25/2016   CHOL 222 (H) 09/17/2015   CHOL 155 12/26/2010   Lab Results  Component Value Date   HDL 55.40 11/25/2016   HDL 46 09/17/2015   HDL 41 12/26/2010   Lab Results  Component Value Date   LDLCALC 153 (H) 11/25/2016   LDLCALC 111 09/17/2015   LDLCALC 98 12/26/2010   Lab Results  Component Value Date   TRIG 169.0 (H) 11/25/2016   TRIG 327 (H) 09/17/2015   TRIG 82 12/26/2010   Lab Results  Component Value Date   CHOLHDL 4 11/25/2016   CHOLHDL 4.8 09/17/2015   CHOLHDL 3.8 12/26/2010   Lab Results  Component Value Date   LDLDIRECT 93 12/16/2011   Glucose:  Glucose, Bld  Date Value Ref Range Status  11/25/2016 123 (H) 70 - 99 mg/dL Final  40/98/1191 93 65 - 99 mg/dL Final  47/82/9562 95 70 - 99 mg/dL Final   Glucose-Capillary  Date Value Ref Range Status  07/22/2009 121 (H) 70 - 99 mg/dL Final   Alcohol: occassional Tobacco use: current smoker, not ready to quit  STD testing and prevention (chl/gon/syphilis): no concerns, declines testing HIV, hep C: will screen for hepatitis C  today, declines HIV testing  Skin cancer: no concerns, does not wear sunscreen  Colorectal cancer: No personal history of colon cancer, unsure if family history of colon ca, no abdominal pain, no bowel changes, no rectal bleeding.   Prostate cancer: will order PSA screening today Lab Results  Component Value Date   PSA 1.31 07/19/2009   PSA 1.07 07/06/2008   PSA 0.85 01/20/2007   IPSS Questionnaire (AUA-7): Over the past month.   1)  How often have you had a sensation of not emptying your bladder completely after you finish urinating?  1 - Less than 1 time in 5  2)  How often have you had to urinate again less than two hours after you finished urinating? 2 - Less than half the time  3)  How often have you found you stopped and started again several  times when you urinated?  0 - Not at all  4) How difficult have you found it to postpone urination?  1 - Less than 1 time in 5  5) How often have you had a weak urinary stream?  1 - Less than 1 time in 5  6) How often have you had to push or strain to begin urination?  1 - Less than 1 time in 5  7) How many times did you most typically get up to urinate from the time you went to bed until the time you got up in the morning?  3 - 3 times  Total score:  9    Lung cancer:  Declines screening  Aspirin: taking 325 daily ECG: not indicated  Vaccinations: up to date  Advanced Care Planning: A voluntary discussion about advance care planning including the explanation and discussion of advance directives.  Discussed health care proxy and Living will, and the patient was DOES NOT  have a living will at present time. If patient does have living will, I have requested they bring this to the clinic to be scanned in to their chart.  Patient Active Problem List   Diagnosis Date Noted  . Obese 08/15/2013  . Encounter for general adult medical examination with abnormal findings 07/02/2013  . History of total hip arthroplasty 01/29/2013  . CVA (cerebral infarction) 04/11/2012  . Nephrolithiasis 10/30/2011  . Septic hip (HCC) 12/28/2010  . Other chronic pain 04/02/2010  . GERD 04/02/2010  . Chronic kidney disease (CKD), stage II (mild) 07/22/2009  . BPH (benign prostatic hyperplasia) 07/19/2009  . INSOMNIA 06/20/2009  . HYPERTRIGLYCERIDEMIA 05/22/2008  . Depression 11/18/2007  . ATRIAL SEPTAL DEFECT 11/18/2007  . Testosterone deficiency 09/12/2007  . TOBACCO ABUSE 01/20/2007  . Essential hypertension 01/20/2007  . Low back pain 01/20/2007  . AVASCULAR NECROSIS, FEMORAL HEAD 01/20/2007    Past Surgical History:  Procedure Laterality Date  . IVC filter placement Right   . JOINT REPLACEMENT    . TOTAL HIP ARTHROPLASTY Bilateral    left x 2; right x 1  . TOTAL HIP REVISION Right 08/14/2013    Procedure: RIGHT TOTAL HIP REVISION;  Surgeon: [name], MD;  Location: [facility];  Service: Orthopedics;  Laterality: Right;  . Trochanteric bursa injection Left 03/01/13   Preferred Pain Management, Dr. Ardell Isaacs    Family History  Problem Relation Age of Onset  . Arthritis Mother   . Heart disease Mother   . Hypertension Mother     Social History   Socioeconomic History  . Marital status: Divorced    Spouse name: Not on file  .  Number of children: 0  . Years of education: 45  . Highest education level: Not on file  Occupational History  . Occupation: Disability  Social Needs  . Financial resource strain: Not on file  . Food insecurity:    Worry: Not on file    Inability: Not on file  . Transportation needs:    Medical: Not on file    Non-medical: Not on file  Tobacco Use  . Smoking status: Current Every Day Smoker    Packs/day: 1.00    Years: 39.00    Pack years: 39.00    Types: Cigarettes    Last attempt to quit: 07/16/2013    Years since quitting: 4.5  . Smokeless tobacco: Never Used  . Tobacco comment: Recently smoking 2 PPD  Substance and Sexual Activity  . Alcohol use: No    Alcohol/week: 0.0 oz    Comment: Every couple of months  . Drug use: No  . Sexual activity: Never  Lifestyle  . Physical activity:    Days per week: Not on file    Minutes per session: Not on file  . Stress: Not on file  Relationships  . Social connections:    Talks on phone: Not on file    Gets together: Not on file    Attends religious service: Not on file    Active member of club or organization: Not on file    Attends meetings of clubs or organizations: Not on file    Relationship status: Not on file  . Intimate partner violence:    Fear of current or ex partner: Not on file    Emotionally abused: Not on file    Physically abused: Not on file    Forced sexual activity: Not on file  Other Topics Concern  . Not on file  Social History Narrative  . Not on file      Current Outpatient Medications:  .  amLODipine (NORVASC) 10 MG tablet, Take 1 tablet (10 mg total) by mouth daily., Disp: 30 tablet, Rfl: 1 .  b complex vitamins tablet, Take 1 tablet by mouth daily., Disp: 100 tablet, Rfl: 3 .  carvedilol (COREG) 25 MG tablet, TAKE 1 TABLET (25 MG TOTAL) BY MOUTH 2 (TWO) TIMES DAILY WITH A MEAL., Disp: 60 tablet, Rfl: 11 .  Cholecalciferol (VITAMIN D3) 2000 units capsule, Take 1 capsule (2,000 Units total) by mouth daily., Disp: 100 capsule, Rfl: 3 .  citalopram (CELEXA) 20 MG tablet, Take 1 tablet (20 mg total) by mouth daily., Disp: 30 tablet, Rfl: 5 .  pantoprazole (PROTONIX) 40 MG tablet, TAKE 1 TABLET (40 MG TOTAL) BY MOUTH DAILY., Disp: 30 tablet, Rfl: 11  Allergies  Allergen Reactions  . Ace Inhibitors     Possible ACEi induced renal failure.   [name] Hctz [Hydrochlorothiazide]     Thiazide induced hypercalcemia   . Lipitor [Atorvastatin] Nausea And Vomiting  . Penicillins     REACTION: hives     ROS  Constitutional: Negative for fever or weight change.  Respiratory: Negative for cough and shortness of breath.   Cardiovascular: Negative for chest pain or palpitations.  Gastrointestinal: Negative for nausea, heartburn, abdominal pain, no bowel changes.  Genitourinary: Positive for dysuria, urinary incontinence. Musculoskeletal: Positive for gait problem or joint swelling.  Skin: Negative for rash.  Neurological: Negative for dizziness or headache.  No other specific complaints in a complete review of systems (except as listed in HPI above).   Objective  Vitals:   02/09/18  0840  BP: (!) 162/100  Pulse: 67  Resp: 16  Temp: 98.1 F (36.7 C)  TempSrc: Oral  SpO2: 95%  Weight: 240 lb (108.9 kg)  Height:  (1.803 m)    Body mass index is 33.47 kg/m.  Physical Exam Vital signs reviewed. Constitutional: Patient appears well-developed and well-nourished. No distress.  HENT: Head: Normocephalic and atraumatic. Ears: B TMs  ok, no erythema or effusion; Nose: Nose normal. Mouth/Throat: Oropharynx is clear and moist. No oropharyngeal exudate.  Eyes: Conjunctivae and EOM are normal. Pupils are equal, round, and reactive to light. No scleral icterus.  Neck: Normal range of motion. Neck supple. No cervical adenopathy. No thyromegaly present.  Cardiovascular: Normal rate, regular rhythm and normal heart sounds.  No murmur heard. No BLE edema. Pulmonary/Chest: Effort normal. No respiratory distress. Abdominal: Soft. Bowel sounds are normal, no distension. There is no tenderness. no masses Musculoskeletal: Normal range of motion, No gross deformities Neurological: he is alert and oriented to person, place, and time. No cranial nerve deficit. Coordination, balance, strength, speech are normal. Ambulatory with cane. Skin: Skin is warm and dry. No rash noted. No erythema.  Psychiatric: Patient has a normal mood and affect. behavior is normal. Judgment and thought content normal.  Assessment & Plan RTC in 2 weeks for F/U: HTN-increasing amlodipine  Dysuria Dysuria and urinary frequency reported on ROS today Labwork ordered, will F/U with further recommendations pending lab results - PSA; Future - Urinalysis; Future - Urine Culture; Future

## 2018-02-09 NOTE — Assessment & Plan Note (Addendum)
-  Prostate cancer screening and PSA options (with potential risks and benefits of testing vs not testing) were discussed along with recent recs/guidelines. -USPSTF grade A and B recommendations reviewed with patient; age-appropriate recommendations, preventive care, screening tests, etc discussed and encouraged; healthy living  encouraged; see AVS for patient education given to patient. Declines advanced directives packet. -Discussed importance of 150 minutes of physical activity weekly, eat 6 servings of fruit/vegetables daily and drink plenty of water and avoid sweet beverages.  -Red flags and when to present for emergency care or RTC including fever >101.80F, chest pain, shortness of breath, new/worsening/un-resolving symptoms, reviewed with patient at time of visit. Follow up and care instructions discussed and provided in AVS.   -Reviewed Health Maintenance:  Encounter for hepatitis C screening test for low risk patient- Hepatitis C antibody; Future Screening for colon cancer Encouraged referral for screening colonoscopy today but he Declines He would be interested in cologuard, instructed to check insurance coverage and call back to office for order to be placed   - TSH; Future

## 2018-02-09 NOTE — Assessment & Plan Note (Signed)
H/O CVA in past, off statin or cholesterol lowering agent per patient preference We discussed statin therapy as agent to prevent a second stroke but he prefers to remain off statin due to side effects of nausea, upset stomach He is fasting today, will update lipid panel and follow up with further recommendations - Lipid panel; Future - TSH; Future

## 2018-02-09 NOTE — Assessment & Plan Note (Signed)
Stable, continue protonix F/u for new, worsening symptoms - Magnesium; Future

## 2018-02-09 NOTE — Assessment & Plan Note (Signed)
BP reading is quite elevated today and also noted to be high on recent past clinic readings Continue carvedilol, increase amlodipine to 10 daily RTC in 2 weeks for F/u - CBC; Future - Comprehensive metabolic panel; Future - TSH; Future - amLODipine (NORVASC) 10 MG tablet; Take 1 tablet (10 mg total) by mouth daily.  Dispense: 30 tablet; Refill: 1

## 2018-02-09 NOTE — Assessment & Plan Note (Signed)
Stable, continue celexa F/U for new or worsening symptoms

## 2018-02-13 LAB — URINE CULTURE
MICRO NUMBER:: 90561476
SPECIMEN QUALITY:: ADEQUATE

## 2018-02-13 LAB — HEPATITIS C ANTIBODY
Hepatitis C Ab: NONREACTIVE
SIGNAL TO CUT-OFF: 0.02 (ref <1.00)

## 2018-02-15 ENCOUNTER — Other Ambulatory Visit: Payer: Self-pay | Admitting: Nurse Practitioner

## 2018-02-15 MED ORDER — NITROFURANTOIN MONOHYD MACRO 100 MG PO CAPS
100.0000 mg | ORAL_CAPSULE | Freq: Two times a day (BID) | ORAL | 0 refills | Status: DC
Start: 2018-02-15 — End: 2018-02-23

## 2018-02-16 ENCOUNTER — Telehealth: Payer: Self-pay | Admitting: Nurse Practitioner

## 2018-02-16 NOTE — Telephone Encounter (Signed)
Copied from CRM [id number]. Topic: Quick Communication - Lab Results >> Feb 15, 2018  2:30 PM Barbette Reichmann, CMA wrote: Called patient to inform them of 02/09/18 lab results. When patient returns call, triage nurse may disclose results. [phone]

## 2018-02-16 NOTE — Telephone Encounter (Signed)
Patient notified of results and options- message sent back to office on lab.

## 2018-02-21 DIAGNOSIS — M25572 Pain in left ankle and joints of left foot: Secondary | ICD-10-CM | POA: Diagnosis not present

## 2018-02-21 DIAGNOSIS — G894 Chronic pain syndrome: Secondary | ICD-10-CM | POA: Diagnosis not present

## 2018-02-21 DIAGNOSIS — G2581 Restless legs syndrome: Secondary | ICD-10-CM | POA: Diagnosis not present

## 2018-02-21 DIAGNOSIS — M545 Low back pain: Secondary | ICD-10-CM | POA: Diagnosis not present

## 2018-02-21 DIAGNOSIS — M25552 Pain in left hip: Secondary | ICD-10-CM | POA: Diagnosis not present

## 2018-02-21 DIAGNOSIS — Z79891 Long term (current) use of opiate analgesic: Secondary | ICD-10-CM | POA: Diagnosis not present

## 2018-02-22 ENCOUNTER — Other Ambulatory Visit: Payer: Self-pay | Admitting: Nurse Practitioner

## 2018-02-23 ENCOUNTER — Encounter: Payer: Self-pay | Admitting: Nurse Practitioner

## 2018-02-23 ENCOUNTER — Ambulatory Visit (INDEPENDENT_AMBULATORY_CARE_PROVIDER_SITE_OTHER): Payer: Medicare HMO | Admitting: Nurse Practitioner

## 2018-02-23 ENCOUNTER — Other Ambulatory Visit (INDEPENDENT_AMBULATORY_CARE_PROVIDER_SITE_OTHER): Payer: Medicare HMO

## 2018-02-23 VITALS — BP 120/78 | HR 78 | Temp 97.8°F | Resp 16 | Ht 71.0 in | Wt 234.4 lb

## 2018-02-23 DIAGNOSIS — E785 Hyperlipidemia, unspecified: Secondary | ICD-10-CM

## 2018-02-23 DIAGNOSIS — N390 Urinary tract infection, site not specified: Secondary | ICD-10-CM | POA: Diagnosis not present

## 2018-02-23 DIAGNOSIS — I10 Essential (primary) hypertension: Secondary | ICD-10-CM | POA: Diagnosis not present

## 2018-02-23 LAB — URINALYSIS, ROUTINE W REFLEX MICROSCOPIC
Bilirubin Urine: NEGATIVE
Ketones, ur: NEGATIVE
Nitrite: POSITIVE — AB
UROBILINOGEN UA: 0.2 (ref 0.0–1.0)
Urine Glucose: NEGATIVE
pH: 5.5 (ref 5.0–8.0)

## 2018-02-23 MED ORDER — PITAVASTATIN CALCIUM 2 MG PO TABS: 2.0000 mg | ORAL_TABLET | Freq: Every day | ORAL | 1 refills | Status: DC

## 2018-02-23 NOTE — Progress Notes (Signed)
Name: Jeffrey [name].   MRN: [id number]    DOB: 09-26-1961   Date:02/23/2018       Progress Note  Subjective  Chief Complaint  Chief Complaint  Patient presents with  . Follow-up    HPI Jeffrey [name] is here today for follow up of hypertension, hyperlipidemia and a urinary tract infection, problems recently addressed on my first visit for CPE with him on 02/09/18.  Hypertension -maintained on carvedilol 25 BID and his amlodipine was increased to 10 mg daily at his last OV on 02/09/18 due to high blood pressure reading. Reports he had adjusted medications as instructed without noted adverse medication effects. Reports he does not routinely check his blood pressure readings at home Denies headaches, vision changes, chest pain, shortness of breath.  BP Readings from Last 3 Encounters:  02/23/18 120/78  02/09/18 (!) 162/100  11/18/17 (!) 144/92   UTI- At last OV he had urinary complaints- dysuria, urinary frequency. His urinalysis and culture showed UTI and he was given Rx for macrobid, which he has completed. He says his urinary symptoms are completely gone. He did start drinking beet juice and noted red urine after this. Denies fevers, urinary frequency, urinary hesitancy, dysuria.  HLD- Despite hx of CVA, Has been maintained off statins due to side effect of nausea  Lipid panel was updated at last OV with continued HLD.  Lab Results  Component Value Date   CHOL 202 (H) 02/09/2018   HDL 47.90 02/09/2018   LDLCALC 120 (H) 02/09/2018   LDLDIRECT 93 12/16/2011   TRIG 167.0 (H) 02/09/2018   CHOLHDL 4 02/09/2018     Patient Active Problem List   Diagnosis Date Noted  . Vitamin D deficiency 02/09/2018  . Obese 08/15/2013  . Encounter for general adult medical examination with abnormal findings 07/02/2013  . History of total hip arthroplasty 01/29/2013  . CVA (cerebral infarction) 04/11/2012  . Nephrolithiasis 10/30/2011  . Septic hip (HCC) 12/28/2010  . Other chronic pain  04/02/2010  . GERD 04/02/2010  . Chronic kidney disease (CKD), stage II (mild) 07/22/2009  . BPH (benign prostatic hyperplasia) 07/19/2009  . INSOMNIA 06/20/2009  . HYPERTRIGLYCERIDEMIA 05/22/2008  . Depression 11/18/2007  . ATRIAL SEPTAL DEFECT 11/18/2007  . Testosterone deficiency 09/12/2007  . TOBACCO ABUSE 01/20/2007  . Essential hypertension 01/20/2007  . Low back pain 01/20/2007  . AVASCULAR NECROSIS, FEMORAL HEAD 01/20/2007    Past Surgical History:  Procedure Laterality Date  . IVC filter placement Right   . JOINT REPLACEMENT    . TOTAL HIP ARTHROPLASTY Bilateral    left x 2; right x 1  . TOTAL HIP REVISION Right 08/14/2013   Procedure: RIGHT TOTAL HIP REVISION;  Surgeon: [name], MD;  Location: [facility];  Service: Orthopedics;  Laterality: Right;  . Trochanteric bursa injection Left 03/01/13   Preferred Pain Management, Dr. Ardell Isaacs    Family History  Problem Relation Age of Onset  . Arthritis Mother   . Heart disease Mother   . Hypertension Mother     Social History   Socioeconomic History  . Marital status: Divorced    Spouse name: Not on file  . Number of children: 0  . Years of education: 49  . Highest education level: Not on file  Occupational History  . Occupation: Disability  Social Needs  . Financial resource strain: Not on file  . Food insecurity:    Worry: Not on file    Inability: Not on file  .  Transportation needs:    Medical: Not on file    Non-medical: Not on file  Tobacco Use  . Smoking status: Current Every Day Smoker    Packs/day: 1.00    Years: 39.00    Pack years: 39.00    Types: Cigarettes    Last attempt to quit: 07/16/2013    Years since quitting: 4.6  . Smokeless tobacco: Never Used  . Tobacco comment: Recently smoking 2 PPD  Substance and Sexual Activity  . Alcohol use: No    Alcohol/week: 0.0 oz    Comment: Every couple of months  . Drug use: No  . Sexual activity: Never  Lifestyle  . Physical activity:     Days per week: Not on file    Minutes per session: Not on file  . Stress: Not on file  Relationships  . Social connections:    Talks on phone: Not on file    Gets together: Not on file    Attends religious service: Not on file    Active member of club or organization: Not on file    Attends meetings of clubs or organizations: Not on file    Relationship status: Not on file  . Intimate partner violence:    Fear of current or ex partner: Not on file    Emotionally abused: Not on file    Physically abused: Not on file    Forced sexual activity: Not on file  Other Topics Concern  . Not on file  Social History Narrative  . Not on file     Current Outpatient Medications:  .  amLODipine (NORVASC) 10 MG tablet, Take 1 tablet (10 mg total) by mouth daily., Disp: 30 tablet, Rfl: 1 .  b complex vitamins tablet, Take 1 tablet by mouth daily., Disp: 100 tablet, Rfl: 3 .  carvedilol (COREG) 25 MG tablet, TAKE 1 TABLET (25 MG TOTAL) BY MOUTH 2 (TWO) TIMES DAILY WITH A MEAL., Disp: 60 tablet, Rfl: 11 .  Cholecalciferol (VITAMIN D3) 2000 units capsule, Take 1 capsule (2,000 Units total) by mouth daily., Disp: 100 capsule, Rfl: 3 .  citalopram (CELEXA) 20 MG tablet, Take 1 tablet (20 mg total) by mouth daily., Disp: 30 tablet, Rfl: 5 .  nitrofurantoin, macrocrystal-monohydrate, (MACROBID) 100 MG capsule, Take 1 capsule (100 mg total) by mouth 2 (two) times daily., Disp: 14 capsule, Rfl: 0 .  oxyCODONE-acetaminophen (PERCOCET) 10-325 MG tablet, Take 1 tablet by mouth every 4 (four) hours as needed for pain., Disp: , Rfl:  .  pantoprazole (PROTONIX) 40 MG tablet, TAKE 1 TABLET (40 MG TOTAL) BY MOUTH DAILY., Disp: 30 tablet, Rfl: 11  Allergies  Allergen Reactions  . Ace Inhibitors     Possible ACEi induced renal failure.   [name] Hctz [Hydrochlorothiazide]     Thiazide induced hypercalcemia   . Lipitor [Atorvastatin] Nausea And Vomiting  . Penicillins     REACTION: hives     ROS See  HPI  Objective  Vitals:   02/23/18 1427  BP: 120/78  Pulse: 78  Resp: 16  Temp: 97.8 F (36.6 C)  TempSrc: Oral  SpO2: 95%  Weight: 234 lb 6.4 oz (106.3 kg)  Height:  (1.803 m)    Body mass index is 32.69 kg/m.  Physical Exam Vital signs reviewed. Constitutional: Patient appears well-developed and well-nourished. No distress.  HENT: Head: Normocephalic and atraumatic. Nose: Nose normal. Mouth/Throat: Oropharynx is clear and moist. No oropharyngeal exudate.  Eyes: Conjunctivae and EOM are normal. Pupils are  equal, round, and reactive to light. No scleral icterus.  Neck: Normal range of motion. Neck supple. Cardiovascular: Normal rate, regular rhythm and normal heart sounds.  No BLE edema. Distal pulses intact. Pulmonary/Chest: Effort normal. No respiratory distress. Neurological: he is alert and oriented to person, place, and time. No cranial nerve deficit. Coordination, balance, strength, speech are normal. Ambulatory with cane. Skin: Skin is warm and dry. No rash noted. No erythema.  Psychiatric: Patient has a normal mood and affect. behavior is normal. Judgment and thought content normal.   Assessment & Plan RTC in 3 months for F/U: HTN- recheck BP  Urinary tract infection without hematuria, site unspecified Symptoms have improved Repeat urine testing for follow up - CULTURE, URINE COMPREHENSIVE; Future - Urinalysis; Future

## 2018-02-23 NOTE — Patient Instructions (Addendum)
Please start livalo  tablets- take 1/2 of a tablet every other day.  Please head downstairs for urine labs only today. If any of your test results are critically abnormal, you will be contacted right away. Your results may be released to your MyChart for viewing before I am able to provide you with my response. I will contact you within a week about your test results and any recommendations for abnormalities.  Please return to the lab in about 6 weeks ,around the first week in July, to have a repeat fasting labwork.  Please come back to see me in about 3 months so I can make sure your blood pressure is still normal.

## 2018-02-27 LAB — CULTURE, URINE COMPREHENSIVE
MICRO NUMBER: 90622444
SPECIMEN QUALITY:: ADEQUATE

## 2018-03-01 ENCOUNTER — Other Ambulatory Visit: Payer: Self-pay | Admitting: Nurse Practitioner

## 2018-03-01 DIAGNOSIS — N390 Urinary tract infection, site not specified: Secondary | ICD-10-CM

## 2018-03-01 MED ORDER — NITROFURANTOIN MONOHYD MACRO 100 MG PO CAPS: 100.0000 mg | ORAL_CAPSULE | Freq: Two times a day (BID) | ORAL | 0 refills | Status: DC

## 2018-03-08 ENCOUNTER — Telehealth: Payer: Self-pay

## 2018-03-08 NOTE — Telephone Encounter (Signed)
PA has been initiated and approved for Livalo expiration date 10/04/2018. Pharmacy is aware.

## 2018-03-17 ENCOUNTER — Encounter: Payer: Self-pay | Admitting: Nurse Practitioner

## 2018-03-17 DIAGNOSIS — E785 Hyperlipidemia, unspecified: Secondary | ICD-10-CM | POA: Insufficient documentation

## 2018-03-17 NOTE — Assessment & Plan Note (Signed)
BP reading has improved today Continue current medications  RTC in 3 months for F/U to make sure BP remains stable

## 2018-03-17 NOTE — Assessment & Plan Note (Addendum)
Discussed trial of livalo which may be easier for him to tolerate and he is agreeable- dosing and side effects discussed Repeat fasting labwork in 6 weeks Will consider referral to lipid clinic for further management of HLD if he is unable to tolerate livalo or his lipid panel does not improve - Pitavastatin Calcium (LIVALO) 2 MG TABS; Take 1 tablet (2 mg total) by mouth daily.  Dispense: 30 tablet; Refill: 1 - Comprehensive metabolic panel; Future - Lipid panel; Future

## 2018-03-21 DIAGNOSIS — G2581 Restless legs syndrome: Secondary | ICD-10-CM | POA: Diagnosis not present

## 2018-03-21 DIAGNOSIS — M25572 Pain in left ankle and joints of left foot: Secondary | ICD-10-CM | POA: Diagnosis not present

## 2018-03-21 DIAGNOSIS — Z79891 Long term (current) use of opiate analgesic: Secondary | ICD-10-CM | POA: Diagnosis not present

## 2018-03-21 DIAGNOSIS — M25552 Pain in left hip: Secondary | ICD-10-CM | POA: Diagnosis not present

## 2018-03-21 DIAGNOSIS — G894 Chronic pain syndrome: Secondary | ICD-10-CM | POA: Diagnosis not present

## 2018-03-21 DIAGNOSIS — M545 Low back pain: Secondary | ICD-10-CM | POA: Diagnosis not present

## 2018-04-25 DIAGNOSIS — M25572 Pain in left ankle and joints of left foot: Secondary | ICD-10-CM | POA: Diagnosis not present

## 2018-04-25 DIAGNOSIS — G2581 Restless legs syndrome: Secondary | ICD-10-CM | POA: Diagnosis not present

## 2018-04-25 DIAGNOSIS — Z79891 Long term (current) use of opiate analgesic: Secondary | ICD-10-CM | POA: Diagnosis not present

## 2018-04-25 DIAGNOSIS — M25552 Pain in left hip: Secondary | ICD-10-CM | POA: Diagnosis not present

## 2018-04-25 DIAGNOSIS — M545 Low back pain: Secondary | ICD-10-CM | POA: Diagnosis not present

## 2018-04-25 DIAGNOSIS — G894 Chronic pain syndrome: Secondary | ICD-10-CM | POA: Diagnosis not present

## 2018-05-18 ENCOUNTER — Encounter: Payer: Self-pay | Admitting: Nurse Practitioner

## 2018-05-23 DIAGNOSIS — G2581 Restless legs syndrome: Secondary | ICD-10-CM | POA: Diagnosis not present

## 2018-05-23 DIAGNOSIS — G894 Chronic pain syndrome: Secondary | ICD-10-CM | POA: Diagnosis not present

## 2018-05-23 DIAGNOSIS — Z79891 Long term (current) use of opiate analgesic: Secondary | ICD-10-CM | POA: Diagnosis not present

## 2018-05-23 DIAGNOSIS — M25552 Pain in left hip: Secondary | ICD-10-CM | POA: Diagnosis not present

## 2018-05-23 DIAGNOSIS — M545 Low back pain: Secondary | ICD-10-CM | POA: Diagnosis not present

## 2018-05-23 DIAGNOSIS — M25572 Pain in left ankle and joints of left foot: Secondary | ICD-10-CM | POA: Diagnosis not present

## 2018-05-26 ENCOUNTER — Ambulatory Visit (INDEPENDENT_AMBULATORY_CARE_PROVIDER_SITE_OTHER): Payer: Medicare HMO | Admitting: Nurse Practitioner

## 2018-05-26 ENCOUNTER — Encounter: Payer: Self-pay | Admitting: Nurse Practitioner

## 2018-05-26 VITALS — BP 160/110 | HR 91 | Ht 71.0 in | Wt 232.0 lb

## 2018-05-26 DIAGNOSIS — I10 Essential (primary) hypertension: Secondary | ICD-10-CM | POA: Diagnosis not present

## 2018-05-26 DIAGNOSIS — N390 Urinary tract infection, site not specified: Secondary | ICD-10-CM | POA: Diagnosis not present

## 2018-05-26 DIAGNOSIS — E785 Hyperlipidemia, unspecified: Secondary | ICD-10-CM | POA: Diagnosis not present

## 2018-05-26 MED ORDER — CLONIDINE HCL 0.1 MG PO TABS: 0.1000 mg | ORAL_TABLET | Freq: Every day | ORAL | 3 refills | Status: DC

## 2018-05-26 NOTE — Patient Instructions (Addendum)
Please continue current blood pressure medications and ADD clonidine 0.1mg  nightly at bedtime for your blood pressure.  Please return in about 2 weeks for follow up, so I can see how you are doing and recheck your blood pressure.  Please call  [facility] at [phone] For follow up of urinary tract infection  I have placed a referral to cardiology for the lipid clinic. Our office will begin processing this referral. Please follow up if you have not heard anything about this referral within 10 days.   DASH Eating Plan DASH stands for "Dietary Approaches to Stop Hypertension." The DASH eating plan is a healthy eating plan that has been shown to reduce high blood pressure (hypertension). It may also reduce your risk for type 2 diabetes, heart disease, and stroke. The DASH eating plan may also help with weight loss. What are tips for following this plan? General guidelines  Avoid eating more than 2,300 mg (milligrams) of salt (sodium) a day. If you have hypertension, you may need to reduce your sodium intake to 1,500 mg a day.  Limit alcohol intake to no more than 1 drink a day for nonpregnant women and 2 drinks a day for men. One drink equals 12 oz of beer, 5 oz of wine, or 1 oz of hard liquor.  Work with your health care provider to maintain a healthy body weight or to lose weight. Ask what an ideal weight is for you.  Get at least 30 minutes of exercise that causes your heart to beat faster (aerobic exercise) most days of the week. Activities may include walking, swimming, or biking.  Work with your health care provider or diet and nutrition specialist (dietitian) to adjust your eating plan to your individual calorie needs. Reading food labels  Check food labels for the amount of sodium per serving. Choose foods with less than 5 percent of the Daily Value of sodium. Generally, foods with less than 300 mg of sodium per serving fit into this eating plan.  To find whole grains,  look for the word "whole" as the first word in the ingredient list. Shopping  Buy products labeled as "low-sodium" or "no salt added."  Buy fresh foods. Avoid canned foods and premade or frozen meals. Cooking  Avoid adding salt when cooking. Use salt-free seasonings or herbs instead of table salt or sea salt. Check with your health care provider or pharmacist before using salt substitutes.  Do not fry foods. Cook foods using healthy methods such as baking, boiling, grilling, and broiling instead.  Cook with heart-healthy oils, such as olive, canola, soybean, or sunflower oil. Meal planning   Eat a balanced diet that includes: ? 5 or more servings of fruits and vegetables each day. At each meal, try to fill half of your plate with fruits and vegetables. ? Up to 6-8 servings of whole grains each day. ? Less than 6 oz of lean meat, poultry, or fish each day. A 3-oz serving of meat is about the same size as a deck of cards. One egg equals 1 oz. ? 2 servings of low-fat dairy each day. ? A serving of nuts, seeds, or beans 5 times each week. ? Heart-healthy fats. Healthy fats called Omega-3 fatty acids are found in foods such as flaxseeds and coldwater fish, like sardines, salmon, and mackerel.  Limit how much you eat of the following: ? Canned or prepackaged foods. ? Food that is high in trans fat, such as fried foods. ? Food that is high  in saturated fat, such as fatty meat. ? Sweets, desserts, sugary drinks, and other foods with added sugar. ? Full-fat dairy products.  Do not salt foods before eating.  Try to eat at least 2 vegetarian meals each week.  Eat more home-cooked food and less restaurant, buffet, and fast food.  When eating at a restaurant, ask that your food be prepared with less salt or no salt, if possible. What foods are recommended? The items listed may not be a complete list. Talk with your dietitian about what dietary choices are best for you. Grains Whole-grain  or whole-wheat bread. Whole-grain or whole-wheat pasta. Brown rice. Orpah Cobb. Bulgur. Whole-grain and low-sodium cereals. Pita bread. Low-fat, low-sodium crackers. Whole-wheat flour tortillas. Vegetables Fresh or frozen vegetables (raw, steamed, roasted, or grilled). Low-sodium or reduced-sodium tomato and vegetable juice. Low-sodium or reduced-sodium tomato sauce and tomato paste. Low-sodium or reduced-sodium canned vegetables. Fruits All fresh, dried, or frozen fruit. Canned fruit in natural juice (without added sugar). Meat and other protein foods Skinless chicken or [place]. Ground chicken or [place]. Pork with fat trimmed off. Fish and seafood. Egg whites. Dried beans, peas, or lentils. Unsalted nuts, nut butters, and seeds. Unsalted canned beans. Lean cuts of beef with fat trimmed off. Low-sodium, lean deli meat. Dairy Low-fat (1%) or fat-free (skim) milk. Fat-free, low-fat, or reduced-fat cheeses. Nonfat, low-sodium ricotta or cottage cheese. Low-fat or nonfat yogurt. Low-fat, low-sodium cheese. Fats and oils Soft margarine without trans fats. Vegetable oil. Low-fat, reduced-fat, or light mayonnaise and salad dressings (reduced-sodium). Canola, safflower, olive, soybean, and sunflower oils. Avocado. Seasoning and other foods Herbs. Spices. Seasoning mixes without salt. Unsalted popcorn and pretzels. Fat-free sweets. What foods are not recommended? The items listed may not be a complete list. Talk with your dietitian about what dietary choices are best for you. Grains Baked goods made with fat, such as croissants, muffins, or some breads. Dry pasta or rice meal packs. Vegetables Creamed or fried vegetables. Vegetables in a cheese sauce. Regular canned vegetables (not low-sodium or reduced-sodium). Regular canned tomato sauce and paste (not low-sodium or reduced-sodium). Regular tomato and vegetable juice (not low-sodium or reduced-sodium). Rosita Fire. Olives. Fruits Canned fruit in a  light or heavy syrup. Fried fruit. Fruit in cream or butter sauce. Meat and other protein foods Fatty cuts of meat. Ribs. Fried meat. Tomasa Blase. Sausage. Bologna and other processed lunch meats. Salami. Fatback. Hotdogs. Bratwurst. Salted nuts and seeds. Canned beans with added salt. Canned or smoked fish. Whole eggs or egg yolks. Chicken or [place] with skin. Dairy Whole or 2% milk, cream, and half-and-half. Whole or full-fat cream cheese. Whole-fat or sweetened yogurt. Full-fat cheese. Nondairy creamers. Whipped toppings. Processed cheese and cheese spreads. Fats and oils Butter. Stick margarine. Lard. Shortening. Ghee. Bacon fat. Tropical oils, such as coconut, palm kernel, or palm oil. Seasoning and other foods Salted popcorn and pretzels. Onion salt, garlic salt, seasoned salt, table salt, and sea salt. Worcestershire sauce. Tartar sauce. Barbecue sauce. Teriyaki sauce. Soy sauce, including reduced-sodium. Steak sauce. Canned and packaged gravies. Fish sauce. Oyster sauce. Cocktail sauce. Horseradish that you find on the shelf. Ketchup. Mustard. Meat flavorings and tenderizers. Bouillon cubes. Hot sauce and Tabasco sauce. Premade or packaged marinades. Premade or packaged taco seasonings. Relishes. Regular salad dressings. Where to find more information:  National Heart, Lung, and Blood Institute: PopSteam.is  American Heart Association: www.heart.org Summary  The DASH eating plan is a healthy eating plan that has been shown to reduce high blood pressure (hypertension). It may  also reduce your risk for type 2 diabetes, heart disease, and stroke.  With the DASH eating plan, you should limit salt (sodium) intake to 2,300 mg a day. If you have hypertension, you may need to reduce your sodium intake to 1,500 mg a day.  When on the DASH eating plan, aim to eat more fresh fruits and vegetables, whole grains, lean proteins, low-fat dairy, and heart-healthy fats.  Work with your health care  provider or diet and nutrition specialist (dietitian) to adjust your eating plan to your individual calorie needs. This information is not intended to replace advice given to you by your health care provider. Make sure you discuss any questions you have with your health care provider. Document Released: 09/10/2011 Document Revised: 09/14/2016 Document Reviewed: 09/14/2016 Elsevier Interactive Patient Education  [employer]2018 [employer]

## 2018-05-26 NOTE — Assessment & Plan Note (Signed)
BP elevated in clinic today and patient reports elevated home readings as well He is agreeable to adding another BP agent but Is concerned with cost Continue current medications and add clonidine 0.1 at bedtime, should be affordable but I have asked him to let me know if not Discussed the role of healthy diet and exercise in the management of HTN and printed additional information on AVS RTC in 2 weeks for F/U- recheck BP - cloNIDine (CATAPRES) 0.1 MG tablet; Take 1 tablet (0.1 mg total) by mouth at bedtime.  Dispense: 30 tablet; Refill: 3

## 2018-05-26 NOTE — Progress Notes (Signed)
Name: Jeffrey [name].   MRN: [id number]    DOB: 06/29/1961   Date:05/26/2018       Progress Note  Subjective  Chief Complaint  Follow up  HPI Jeffrey [name] is here today for a 3 month blood pressure follow up. We will also discuss hyperlipidemia. He also tells me that he never got the phone call about urology referral that was placed after his abnormal urine culture on 5/29, so we will follow up on this today as well  Hypertension -maintained on carvedilol 25 BID and amlodipine 10 daily  Reports daily medication compliance without noted adverse medication effects. Reports he occasionally checks his BP readings at home and he has noticed elevated readings. Reports some occasional headaches. Denies vision changes, chest pain, edema.  BP Readings from Last 3 Encounters:  05/26/18 (!) 160/110  02/23/18 120/78  02/09/18 (!) 162/100    Cholesterol- trial of livalo was started at last OV due to hx of statin intolerance He tells me today that he took the sample pack of livalo given to him but it upset his stomach, however he was going to continue the medication but went to pharmacy to pick it up and it was too expensive so he stopped taking it.  Lab Results  Component Value Date   CHOL 202 (H) 02/09/2018   HDL 47.90 02/09/2018   LDLCALC 120 (H) 02/09/2018   LDLDIRECT 93 12/16/2011   TRIG 167.0 (H) 02/09/2018   CHOLHDL 4 02/09/2018      Patient Active Problem List   Diagnosis Date Noted  . Hyperlipidemia 03/17/2018  . Vitamin D deficiency 02/09/2018  . Obese 08/15/2013  . Encounter for general adult medical examination with abnormal findings 07/02/2013  . History of total hip arthroplasty 01/29/2013  . CVA (cerebral infarction) 04/11/2012  . Nephrolithiasis 10/30/2011  . Septic hip (HCC) 12/28/2010  . Other chronic pain 04/02/2010  . GERD 04/02/2010  . Chronic kidney disease (CKD), stage II (mild) 07/22/2009  . BPH (benign prostatic hyperplasia) 07/19/2009  . INSOMNIA  06/20/2009  . HYPERTRIGLYCERIDEMIA 05/22/2008  . Depression 11/18/2007  . ATRIAL SEPTAL DEFECT 11/18/2007  . Testosterone deficiency 09/12/2007  . TOBACCO ABUSE 01/20/2007  . Essential hypertension 01/20/2007  . Low back pain 01/20/2007  . AVASCULAR NECROSIS, FEMORAL HEAD 01/20/2007    Past Surgical History:  Procedure Laterality Date  . IVC filter placement Right   . JOINT REPLACEMENT    . TOTAL HIP ARTHROPLASTY Bilateral    left x 2; right x 1  . TOTAL HIP REVISION Right 08/14/2013   Procedure: RIGHT TOTAL HIP REVISION;  Surgeon: [name], MD;  Location: [facility];  Service: Orthopedics;  Laterality: Right;  . Trochanteric bursa injection Left 03/01/13   Preferred Pain Management, Dr. Ardell Isaacs    Family History  Problem Relation Age of Onset  . Arthritis Mother   . Heart disease Mother   . Hypertension Mother     Social History   Socioeconomic History  . Marital status: Divorced    Spouse name: Not on file  . Number of children: 0  . Years of education: 19  . Highest education level: Not on file  Occupational History  . Occupation: Disability  Social Needs  . Financial resource strain: Not on file  . Food insecurity:    Worry: Not on file    Inability: Not on file  . Transportation needs:    Medical: Not on file    Non-medical: Not on file  Tobacco Use  . Smoking status: Current Every Day Smoker    Packs/day: 1.00    Years: 39.00    Pack years: 39.00    Types: Cigarettes    Last attempt to quit: 07/16/2013    Years since quitting: 4.8  . Smokeless tobacco: Never Used  . Tobacco comment: Recently smoking 2 PPD  Substance and Sexual Activity  . Alcohol use: No    Alcohol/week: 0.0 standard drinks    Comment: Every couple of months  . Drug use: No  . Sexual activity: Never  Lifestyle  . Physical activity:    Days per week: Not on file    Minutes per session: Not on file  . Stress: Not on file  Relationships  . Social connections:    Talks on  phone: Not on file    Gets together: Not on file    Attends religious service: Not on file    Active member of club or organization: Not on file    Attends meetings of clubs or organizations: Not on file    Relationship status: Not on file  . Intimate partner violence:    Fear of current or ex partner: Not on file    Emotionally abused: Not on file    Physically abused: Not on file    Forced sexual activity: Not on file  Other Topics Concern  . Not on file  Social History Narrative  . Not on file     Current Outpatient Medications:  .  b complex vitamins tablet, Take 1 tablet by mouth daily., Disp: 100 tablet, Rfl: 3 .  carvedilol (COREG) 25 MG tablet, TAKE 1 TABLET (25 MG TOTAL) BY MOUTH 2 (TWO) TIMES DAILY WITH A MEAL., Disp: 60 tablet, Rfl: 11 .  Cholecalciferol (VITAMIN D3) 2000 units capsule, Take 1 capsule (2,000 Units total) by mouth daily., Disp: 100 capsule, Rfl: 3 .  citalopram (CELEXA) 20 MG tablet, Take 1 tablet (20 mg total) by mouth daily., Disp: 30 tablet, Rfl: 5 .  oxyCODONE-acetaminophen (PERCOCET) 10-325 MG tablet, Take 1 tablet by mouth every 4 (four) hours as needed for pain., Disp: , Rfl:  .  pantoprazole (PROTONIX) 40 MG tablet, TAKE 1 TABLET (40 MG TOTAL) BY MOUTH DAILY., Disp: 30 tablet, Rfl: 11 .  amLODipine (NORVASC) 10 MG tablet, Take 1 tablet (10 mg total) by mouth daily., Disp: 30 tablet, Rfl: 1 .  Pitavastatin Calcium (LIVALO) 2 MG TABS, Take 1 tablet (2 mg total) by mouth daily. (Patient not taking: Reported on 05/26/2018), Disp: 30 tablet, Rfl: 1  Allergies  Allergen Reactions  . Ace Inhibitors     Possible ACEi induced renal failure.   [name]. Hctz [Hydrochlorothiazide]     Thiazide induced hypercalcemia   . Lipitor [Atorvastatin] Nausea And Vomiting  . Penicillins     REACTION: hives     ROS See HPI  Objective  Vitals:   05/26/18 1325  BP: (!) 160/110  Pulse: 91  SpO2: 93%  Weight: 232 lb (105.2 kg)  Height: 5\' 11"  (1.803 m)    Body  mass index is 32.36 kg/m.  Physical Exam Vital signs reviewed. Constitutional: Patient appears well-developed and well-nourished. No distress.  HENT: Head: Normocephalic and atraumatic.  Nose: Nose normal. Mouth/Throat: Oropharynx is clear and moist. No oropharyngeal exudate.  Eyes: Conjunctivae and EOM are normal. Pupils are equal, round, and reactive to light. No scleral icterus.  Neck: Normal range of motion. Neck supple.   Cardiovascular: Normal rate, regular rhythm and  normal heart sounds. No BLE edema. Distal pulses intact. Pulmonary/Chest: Effort normal and breath sounds normal. No respiratory distress. Neurological: he is alert and oriented to person, place, and time. No cranial nerve deficit. Coordination, balance, strength, speecharenormal. Ambulatory with cane. Skin: Skin is warm and dry. No rash noted. No erythema.  Psychiatric: Patient has a normal mood and affect. behavior is normal. Judgment and thought content normal.   Assessment & Plan RTC in 2 weeks for F/U: HTN-adding clonidine  Urinary tract infection without hematuria, site unspecified Per referral notes, [facility] did reach out to schedule appointment with patient by phone and mail, I have provided their office number to the patient today to call and schedule appointment

## 2018-05-26 NOTE — Assessment & Plan Note (Signed)
Given his history of CVA and intolerance to livalo and statins, I recommended referral to lipid clinic for further management and he is agreeable to referral  Discussed the role of healthy diet and exercise in the management of HTN and printed additional information on AVS - [facility]B Referral to [facility]

## 2018-06-13 ENCOUNTER — Ambulatory Visit (INDEPENDENT_AMBULATORY_CARE_PROVIDER_SITE_OTHER)
Admission: RE | Admit: 2018-06-13 | Discharge: 2018-06-13 | Disposition: A | Discharge status: Home / Self Care | Payer: Medicare HMO | Source: Ambulatory Visit | Attending: Nurse Practitioner | Admitting: Nurse Practitioner

## 2018-06-13 ENCOUNTER — Ambulatory Visit (INDEPENDENT_AMBULATORY_CARE_PROVIDER_SITE_OTHER): Payer: Medicare HMO | Admitting: Nurse Practitioner

## 2018-06-13 ENCOUNTER — Encounter: Payer: Self-pay | Admitting: Nurse Practitioner

## 2018-06-13 VITALS — BP 180/100 | HR 70 | Ht 71.0 in | Wt 237.0 lb

## 2018-06-13 DIAGNOSIS — Z471 Aftercare following joint replacement surgery: Secondary | ICD-10-CM | POA: Diagnosis not present

## 2018-06-13 DIAGNOSIS — I10 Essential (primary) hypertension: Secondary | ICD-10-CM | POA: Diagnosis not present

## 2018-06-13 DIAGNOSIS — M25552 Pain in left hip: Secondary | ICD-10-CM | POA: Diagnosis not present

## 2018-06-13 DIAGNOSIS — Z96642 Presence of left artificial hip joint: Secondary | ICD-10-CM | POA: Diagnosis not present

## 2018-06-13 DIAGNOSIS — Z96641 Presence of right artificial hip joint: Secondary | ICD-10-CM | POA: Diagnosis not present

## 2018-06-13 DIAGNOSIS — Z23 Encounter for immunization: Secondary | ICD-10-CM | POA: Diagnosis not present

## 2018-06-13 MED ORDER — AMLODIPINE BESYLATE 10 MG PO TABS
10.0000 mg | ORAL_TABLET | Freq: Every day | ORAL | 1 refills | Status: DC
Start: 2018-06-13 — End: 2018-12-07

## 2018-06-13 MED ORDER — CLONIDINE HCL 0.1 MG PO TABS: 0.1000 mg | ORAL_TABLET | Freq: Two times a day (BID) | ORAL | 1 refills | Status: DC

## 2018-06-13 NOTE — Patient Instructions (Addendum)
For your blood pressure: Please continue carvedilol Please resume amlodipine, refill sent Please INCREASE clonidine to 0.1 mg twice daily  For your hip: Please head downstairs for hip xray AND Please schedule a follow up appointment for further evaluation here with Dr Katrinka Blazing or Dr Jordan Likes, our sports medicine providers   Please come back to see me in about 2 weeks for follow up   DASH Eating Plan DASH stands for "Dietary Approaches to Stop Hypertension." The DASH eating plan is a healthy eating plan that has been shown to reduce high blood pressure (hypertension). It may also reduce your risk for type 2 diabetes, heart disease, and stroke. The DASH eating plan may also help with weight loss. What are tips for following this plan? General guidelines  Avoid eating more than 2,300 mg (milligrams) of salt (sodium) a day. If you have hypertension, you may need to reduce your sodium intake to 1,500 mg a day.  Limit alcohol intake to no more than 1 drink a day for nonpregnant women and 2 drinks a day for men. One drink equals 12 oz of beer, 5 oz of wine, or 1 oz of hard liquor.  Work with your health care provider to maintain a healthy body weight or to lose weight. Ask what an ideal weight is for you.  Get at least 30 minutes of exercise that causes your heart to beat faster (aerobic exercise) most days of the week. Activities may include walking, swimming, or biking.  Work with your health care provider or diet and nutrition specialist (dietitian) to adjust your eating plan to your individual calorie needs. Reading food labels  Check food labels for the amount of sodium per serving. Choose foods with less than 5 percent of the Daily Value of sodium. Generally, foods with less than 300 mg of sodium per serving fit into this eating plan.  To find whole grains, look for the word "whole" as the first word in the ingredient list. Shopping  Buy products labeled as "low-sodium" or "no salt  added."  Buy fresh foods. Avoid canned foods and premade or frozen meals. Cooking  Avoid adding salt when cooking. Use salt-free seasonings or herbs instead of table salt or sea salt. Check with your health care provider or pharmacist before using salt substitutes.  Do not fry foods. Cook foods using healthy methods such as baking, boiling, grilling, and broiling instead.  Cook with heart-healthy oils, such as olive, canola, soybean, or sunflower oil. Meal planning   Eat a balanced diet that includes: ? 5 or more servings of fruits and vegetables each day. At each meal, try to fill half of your plate with fruits and vegetables. ? Up to 6-8 servings of whole grains each day. ? Less than 6 oz of lean meat, poultry, or fish each day. A 3-oz serving of meat is about the same size as a deck of cards. One egg equals 1 oz. ? 2 servings of low-fat dairy each day. ? A serving of nuts, seeds, or beans 5 times each week. ? Heart-healthy fats. Healthy fats called Omega-3 fatty acids are found in foods such as flaxseeds and coldwater fish, like sardines, salmon, and mackerel.  Limit how much you eat of the following: ? Canned or prepackaged foods. ? Food that is high in trans fat, such as fried foods. ? Food that is high in saturated fat, such as fatty meat. ? Sweets, desserts, sugary drinks, and other foods with added sugar. ? Full-fat dairy products.  Do not salt foods before eating.  Try to eat at least 2 vegetarian meals each week.  Eat more home-cooked food and less restaurant, buffet, and fast food.  When eating at a restaurant, ask that your food be prepared with less salt or no salt, if possible. What foods are recommended? The items listed may not be a complete list. Talk with your dietitian about what dietary choices are best for you. Grains Whole-grain or whole-wheat bread. Whole-grain or whole-wheat pasta. Brown rice. Modena Morrow. Bulgur. Whole-grain and low-sodium cereals.  Pita bread. Low-fat, low-sodium crackers. Whole-wheat flour tortillas. Vegetables Fresh or frozen vegetables (raw, steamed, roasted, or grilled). Low-sodium or reduced-sodium tomato and vegetable juice. Low-sodium or reduced-sodium tomato sauce and tomato paste. Low-sodium or reduced-sodium canned vegetables. Fruits All fresh, dried, or frozen fruit. Canned fruit in natural juice (without added sugar). Meat and other protein foods Skinless chicken or [place]. Ground chicken or [place]. Pork with fat trimmed off. Fish and seafood. Egg whites. Dried beans, peas, or lentils. Unsalted nuts, nut butters, and seeds. Unsalted canned beans. Lean cuts of beef with fat trimmed off. Low-sodium, lean deli meat. Dairy Low-fat (1%) or fat-free (skim) milk. Fat-free, low-fat, or reduced-fat cheeses. Nonfat, low-sodium ricotta or cottage cheese. Low-fat or nonfat yogurt. Low-fat, low-sodium cheese. Fats and oils Soft margarine without trans fats. Vegetable oil. Low-fat, reduced-fat, or light mayonnaise and salad dressings (reduced-sodium). Canola, safflower, olive, soybean, and sunflower oils. Avocado. Seasoning and other foods Herbs. Spices. Seasoning mixes without salt. Unsalted popcorn and pretzels. Fat-free sweets. What foods are not recommended? The items listed may not be a complete list. Talk with your dietitian about what dietary choices are best for you. Grains Baked goods made with fat, such as croissants, muffins, or some breads. Dry pasta or rice meal packs. Vegetables Creamed or fried vegetables. Vegetables in a cheese sauce. Regular canned vegetables (not low-sodium or reduced-sodium). Regular canned tomato sauce and paste (not low-sodium or reduced-sodium). Regular tomato and vegetable juice (not low-sodium or reduced-sodium). Angie Fava. Olives. Fruits Canned fruit in a light or heavy syrup. Fried fruit. Fruit in cream or butter sauce. Meat and other protein foods Fatty cuts of meat. Ribs. Fried  meat. Berniece Salines. Sausage. Bologna and other processed lunch meats. Salami. Fatback. Hotdogs. Bratwurst. Salted nuts and seeds. Canned beans with added salt. Canned or smoked fish. Whole eggs or egg yolks. Chicken or [place] with skin. Dairy Whole or 2% milk, cream, and half-and-half. Whole or full-fat cream cheese. Whole-fat or sweetened yogurt. Full-fat cheese. Nondairy creamers. Whipped toppings. Processed cheese and cheese spreads. Fats and oils Butter. Stick margarine. Lard. Shortening. Ghee. Bacon fat. Tropical oils, such as coconut, palm kernel, or palm oil. Seasoning and other foods Salted popcorn and pretzels. Onion salt, garlic salt, seasoned salt, table salt, and sea salt. Worcestershire sauce. Tartar sauce. Barbecue sauce. Teriyaki sauce. Soy sauce, including reduced-sodium. Steak sauce. Canned and packaged gravies. Fish sauce. Oyster sauce. Cocktail sauce. Horseradish that you find on the shelf. Ketchup. Mustard. Meat flavorings and tenderizers. Bouillon cubes. Hot sauce and Tabasco sauce. Premade or packaged marinades. Premade or packaged taco seasonings. Relishes. Regular salad dressings. Where to find more information:  National Heart, Lung, and [place]: https://wilson-eaton.com/  American Heart Association: www.heart.org Summary  The DASH eating plan is a healthy eating plan that has been shown to reduce high blood pressure (hypertension). It may also reduce your risk for type 2 diabetes, heart disease, and stroke.  With the DASH eating plan, you should limit salt (sodium)  intake to 2,300 mg a day. If you have hypertension, you may need to reduce your sodium intake to 1,500 mg a day.  When on the DASH eating plan, aim to eat more fresh fruits and vegetables, whole grains, lean proteins, low-fat dairy, and heart-healthy fats.  Work with your health care provider or diet and nutrition specialist (dietitian) to adjust your eating plan to your individual calorie needs. This information is  not intended to replace advice given to you by your health care provider. Make sure you discuss any questions you have with your health care provider. Document Released: 09/10/2011 Document Revised: 09/14/2016 Document Reviewed: 09/14/2016 Elsevier Interactive Patient Education  [employer].

## 2018-06-13 NOTE — Progress Notes (Signed)
Name: Jeffrey [name].   MRN: [id number]    DOB: 04-Oct-1961   Date:06/13/2018       Progress Note  Subjective  Chief Complaint follow up  HPI Jeffrey [name] is here today for HTN follow up, he is also requesting evaluation of left hip pain. For HTN, he is maintained on carvedilol 25 BID, amlodipine 10 daily And clonidine 0.1mg  qhs was added at last OV on 05/26/18 due to uncontrolled blood pressure readings. He tells me that he added the clonidine as instructed without any noted adverse effects, but does tell me that he ran out of his amlodipine  about 2 days ago. Reports he does not check his blood pressure readings at home. He says today that he has been experiencing increasing left hip pain for the past few weeks, and has noticed feeling his hip popping out of place at times. He reports a history of chronic bilateral hip pain, had been following with an orthopedic provider at [facility] for hip pain until last year when he was instructed to follow up as needed. He does report hes suffered from MRSA  infections in hips in the past. He reports some recent headaches. He also reports mild swelling to left ankle which is chronic for him, does not seem worse recently. Denies fevers, chills, falls, syncope, confusion, vision changes, chest pain, shortness of breath.   BP Readings from Last 3 Encounters:  06/13/18 (!) 180/100  05/26/18 (!) 160/110  02/23/18 120/78     Patient Active Problem List   Diagnosis Date Noted  . Hyperlipidemia 03/17/2018  . Vitamin D deficiency 02/09/2018  . Obese 08/15/2013  . Encounter for general adult medical examination with abnormal findings 07/02/2013  . History of total hip arthroplasty 01/29/2013  . CVA (cerebral infarction) 04/11/2012  . Nephrolithiasis 10/30/2011  . Septic hip (HCC) 12/28/2010  . Other chronic pain 04/02/2010  . GERD 04/02/2010  . Chronic kidney disease (CKD), stage II (mild) 07/22/2009  . BPH (benign prostatic hyperplasia) 07/19/2009  .  INSOMNIA 06/20/2009  . HYPERTRIGLYCERIDEMIA 05/22/2008  . Depression 11/18/2007  . ATRIAL SEPTAL DEFECT 11/18/2007  . Testosterone deficiency 09/12/2007  . TOBACCO ABUSE 01/20/2007  . Essential hypertension 01/20/2007  . Low back pain 01/20/2007  . AVASCULAR NECROSIS, FEMORAL HEAD 01/20/2007    Past Surgical History:  Procedure Laterality Date  . IVC filter placement Right   . JOINT REPLACEMENT    . TOTAL HIP ARTHROPLASTY Bilateral    left x 2; right x 1  . TOTAL HIP REVISION Right 08/14/2013   Procedure: RIGHT TOTAL HIP REVISION;  Surgeon: [name], MD;  Location: [facility];  Service: Orthopedics;  Laterality: Right;  . Trochanteric bursa injection Left 03/01/13   Preferred Pain Management, Dr. Ardell Isaacs    Family History  Problem Relation Age of Onset  . Arthritis Mother   . Heart disease Mother   . Hypertension Mother     Social History   Socioeconomic History  . Marital status: Divorced    Spouse name: Not on file  . Number of children: 0  . Years of education: 79  . Highest education level: Not on file  Occupational History  . Occupation: Disability  Social Needs  . Financial resource strain: Not on file  . Food insecurity:    Worry: Not on file    Inability: Not on file  . Transportation needs:    Medical: Not on file    Non-medical: Not on file  Tobacco Use  .  Smoking status: Current Every Day Smoker    Packs/day: 1.00    Years: 39.00    Pack years: 39.00    Types: Cigarettes    Last attempt to quit: 07/16/2013    Years since quitting: 4.9  . Smokeless tobacco: Never Used  . Tobacco comment: Recently smoking 2 PPD  Substance and Sexual Activity  . Alcohol use: No    Alcohol/week: 0.0 standard drinks    Comment: Every couple of months  . Drug use: No  . Sexual activity: Never  Lifestyle  . Physical activity:    Days per week: Not on file    Minutes per session: Not on file  . Stress: Not on file  Relationships  . Social connections:     Talks on phone: Not on file    Gets together: Not on file    Attends religious service: Not on file    Active member of club or organization: Not on file    Attends meetings of clubs or organizations: Not on file    Relationship status: Not on file  . Intimate partner violence:    Fear of current or ex partner: Not on file    Emotionally abused: Not on file    Physically abused: Not on file    Forced sexual activity: Not on file  Other Topics Concern  . Not on file  Social History Narrative  . Not on file     Current Outpatient Medications:  .  b complex vitamins tablet, Take 1 tablet by mouth daily., Disp: 100 tablet, Rfl: 3 .  carvedilol (COREG) 25 MG tablet, TAKE 1 TABLET (25 MG TOTAL) BY MOUTH 2 (TWO) TIMES DAILY WITH A MEAL., Disp: 60 tablet, Rfl: 11 .  Cholecalciferol (VITAMIN D3) 2000 units capsule, Take 1 capsule (2,000 Units total) by mouth daily., Disp: 100 capsule, Rfl: 3 .  citalopram (CELEXA) 20 MG tablet, Take 1 tablet (20 mg total) by mouth daily., Disp: 30 tablet, Rfl: 5 .  cloNIDine (CATAPRES) 0.1 MG tablet, Take 1 tablet (0.1 mg total) by mouth at bedtime., Disp: 30 tablet, Rfl: 3 .  oxyCODONE-acetaminophen (PERCOCET) 10-325 MG tablet, Take 1 tablet by mouth every 4 (four) hours as needed for pain., Disp: , Rfl:  .  pantoprazole (PROTONIX) 40 MG tablet, TAKE 1 TABLET (40 MG TOTAL) BY MOUTH DAILY., Disp: 30 tablet, Rfl: 11 .  amLODipine (NORVASC) 10 MG tablet, Take 1 tablet (10 mg total) by mouth daily., Disp: 30 tablet, Rfl: 1  Allergies  Allergen Reactions  . Ace Inhibitors     Possible ACEi induced renal failure.   [name] Hctz [Hydrochlorothiazide]     Thiazide induced hypercalcemia   . Lipitor [Atorvastatin] Nausea And Vomiting  . Penicillins     REACTION: hives     ROS See HPI  Objective  Vitals:   06/13/18 1447  BP: (!) 180/100  Pulse: 70  SpO2: 94%  Weight: 237 lb (107.5 kg)  Height: 5\' 11"  (1.803 m)    Body mass index is 33.05  kg/m.  Physical Exam Vital signs reviewed. Constitutional: Patient appears well-developed and well-nourished. No distress.  HENT: Head: Normocephalic and atraumatic.  Nose: Nose normal. Mouth/Throat: Oropharynx is clear and moist. No oropharyngeal exudate.  Eyes: Conjunctivae and EOM are normal. Pupils are equal, round, and reactive to light. No scleral icterus.  Neck: Normal range of motion. Neck supple.   Cardiovascular: Normal rate, regular rhythm and normal heart sounds. Distal pulses intact.  Pulmonary/Chest: Effort normal  and breath sounds normal. No respiratory distress. Musculoskeletal: No gross deformities. Mild swelling noted to L ankle. Neurological: he is alert and oriented to person, place, and time. No cranial nerve deficit. Coordination, balance, strength, speecharenormal. Ambulatory with cane. Skin: Skin is warm and dry. No rash noted. No erythema.  Psychiatric: Patient has a normal mood and affect. behavior is normal. Judgment and thought content normal.   Assessment & Plan RTC in 2 weeks for F/U: HTN- resuming amlodipine, increasing clonidine  -Reviewed Health Maintenance:  Need for influenza vaccination- Flu Vaccine QUAD 36+ mos IM  Pain of left hip joint Will Update xray today Recommended follow up with our sports medicine providers for further evaluation of hip pain, he was instructed to schedule follow up appointment with our front desk  - DG HIP UNILAT WITH PELVIS 2-3 VIEWS LEFT; Future

## 2018-06-13 NOTE — Assessment & Plan Note (Signed)
BP is quite elevated today We discussed the long term risks of uncontrolled HTN including MI, stroke and we also discussed the role of daily medication compliance, healthy diet and exercise in the management of HTN. Additional information provided  on AVS We will adjust meds as follows:  continue carvedilol at current dosage, resume amlodipine, refill sent, INCREASE clonidine to 0.1 mg twice daily RTC in 2 weeks for F/U- recheck BP - amLODipine (NORVASC) 10 MG tablet; Take 1 tablet (10 mg total) by mouth daily.  Dispense: 90 tablet; Refill: 1 - cloNIDine (CATAPRES) 0.1 MG tablet; Take 1 tablet (0.1 mg total) by mouth 2 (two) times daily.  Dispense: 60 tablet; Refill: 1

## 2018-06-17 ENCOUNTER — Encounter: Payer: Self-pay | Admitting: Family Medicine

## 2018-06-17 ENCOUNTER — Ambulatory Visit (INDEPENDENT_AMBULATORY_CARE_PROVIDER_SITE_OTHER): Payer: Medicare HMO | Admitting: Family Medicine

## 2018-06-17 VITALS — BP 186/96 | HR 80 | Ht 71.0 in | Wt 236.0 lb

## 2018-06-17 DIAGNOSIS — M25552 Pain in left hip: Secondary | ICD-10-CM | POA: Diagnosis not present

## 2018-06-17 DIAGNOSIS — M25551 Pain in right hip: Secondary | ICD-10-CM | POA: Diagnosis not present

## 2018-06-17 MED ORDER — MELOXICAM 15 MG PO TABS: 15.0000 mg | ORAL_TABLET | Freq: Every day | ORAL | 0 refills | Status: AC

## 2018-06-17 NOTE — Progress Notes (Signed)
Jeffrey PickettBoyd R Kilman Jr. - 57 y.o. male MRN [id number]  Date of birth: 05/07/1961  SUBJECTIVE:  Including CC & ROS.  Chief Complaint  Patient presents with  . Hip Pain    Jeffrey PickettBoyd R Julson Jr. is a 57 y.o. male that is presenting with left hip pain. Pain has been increasing left hip pain for the past few weeks, at times he feels his hip popping out of place. He reports a history of chronic bilateral hip pain, had been following with an [facility] provider at [facility]. Pain is constant, worse when he first stands up to walk. Located at his greater trochanteric and radiates to the anterior aspect. He uses a cane daily to ambulate. He does go to a pain clinic for his pain, was taking percocet but ran out today.  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   Left hip replaced 2010. Right done in 2014.  Independent review of the left hip x-ray from 9/9 shows stable postsurgical hardware bilaterally.   Review of Systems  Constitutional: Negative for fever.  HENT: Negative for congestion.   Respiratory: Negative for cough.   Cardiovascular: Negative for chest pain.  Gastrointestinal: Negative for abdominal pain.  Musculoskeletal: Positive for arthralgias and gait problem.  Skin: Negative for color change.  Neurological: Negative for weakness.  Hematological: Negative for adenopathy.  Psychiatric/Behavioral: Negative for agitation.    HISTORY: Past Medical, Surgical, Social, and Family History Reviewed & Updated per EMR.   Pertinent Historical Findings include:  Past Medical History:  Diagnosis Date  . Allergy   . Arthritis   . Closed fracture of lateral malleolus 01/04/2008   Qualifier: History of  By: [name]  MD, [name]    . Complication of anesthesia    "problems with my breathing- states was told had sleep apnea, but not ever tested"  . Degenerative disc disease, lumbar   . Depression   . GERD (gastroesophageal reflux disease)   . Heart murmur    has hole  in his heart  . Hyperlipidemia   . Hypertension   . Kidney calculus   . Peripheral vascular disease (HCC)    DVT 2004-  left leg  . Presence of IVC filter    right femoral  . Sleep apnea    undiagnosed  . Stroke [facility](HCC) 2004   patient has residual numbness  rt arm    Past Surgical History:  Procedure Laterality Date  . IVC filter placement Right   . JOINT REPLACEMENT    . TOTAL HIP ARTHROPLASTY Bilateral    left x 2; right x 1  . TOTAL HIP REVISION Right 08/14/2013   Procedure: RIGHT TOTAL HIP REVISION;  Surgeon: [name], MD;  Location: [facility];  Service: Orthopedics;  Laterality: Right;  . Trochanteric bursa injection Left 03/01/13   Preferred Pain Management, Dr. Ardell Isaacsavid Spivey    Allergies  Allergen Reactions  . Ace Inhibitors     Possible ACEi induced renal failure.   [name]. Hctz [Hydrochlorothiazide]     Thiazide induced hypercalcemia   . Lipitor [Atorvastatin] Nausea And Vomiting  . Penicillins     REACTION: hives    Family History  Problem Relation Age of Onset  . Arthritis Mother   . Heart disease Mother   . Hypertension Mother      Social History   Socioeconomic History  . Marital status: Divorced    Spouse name: Not on file  . Number of children: 0  . Years  of education: 69  . Highest education level: Not on file  Occupational History  . Occupation: Disability  Social Needs  . Financial resource strain: Not on file  . Food insecurity:    Worry: Not on file    Inability: Not on file  . Transportation needs:    Medical: Not on file    Non-medical: Not on file  Tobacco Use  . Smoking status: Current Every Day Smoker    Packs/day: 1.00    Years: 39.00    Pack years: 39.00    Types: Cigarettes    Last attempt to quit: 07/16/2013    Years since quitting: 4.9  . Smokeless tobacco: Never Used  . Tobacco comment: Recently smoking 2 PPD  Substance and Sexual Activity  . Alcohol use: No    Alcohol/week: 0.0 standard drinks    Comment: Every  couple of months  . Drug use: No  . Sexual activity: Never  Lifestyle  . Physical activity:    Days per week: Not on file    Minutes per session: Not on file  . Stress: Not on file  Relationships  . Social connections:    Talks on phone: Not on file    Gets together: Not on file    Attends religious service: Not on file    Active member of club or organization: Not on file    Attends meetings of clubs or organizations: Not on file    Relationship status: Not on file  . Intimate partner violence:    Fear of current or ex partner: Not on file    Emotionally abused: Not on file    Physically abused: Not on file    Forced sexual activity: Not on file  Other Topics Concern  . Not on file  Social History Narrative  . Not on file     PHYSICAL EXAM:  VS: BP (!) 186/96   Pulse 80   Ht 5\' 11"  (1.803 m)   Wt 236 lb (107 kg)   SpO2 96%   BMI 32.92 kg/m  Physical Exam Gen: NAD, alert, cooperative with exam, well-appearing ENT: normal lips, normal nasal mucosa,  Eye: normal EOM, normal conjunctiva and lids CV:  no edema, +2 pedal pulses   Resp: no accessory muscle use, non-labored,   Skin: no rashes, no areas of induration  Neuro: normal tone, normal sensation to touch Psych:  normal insight, alert and oriented MSK:  Left and right Hip:  No significant tenderness to palpation of the greater trochanter bilaterally.  Pain with internal rotation bilaterally. Normal strength resistance with hip flexion. Negative straight leg raise bilaterally. Walking with a significant limp. No leg length discrepancy. Neurovascular intact.      ASSESSMENT & PLAN:   Bilateral hip pain He has had 2 surgeries on each hip.  He has had a septic hip on the left.  Pain seems to be mostly related to his groin area.  Could be associated with weakness.  Unclear if is associated with any loose components. -Mobic. -May need to perform a bone scan or a CT in order to evaluate further with the x-ray  not revealing for any changes

## 2018-06-17 NOTE — Patient Instructions (Signed)
Nice to meet you  Please try the mobic for the pain  Please let me know what your insurance says about the CT scan or the bone scan.  Give me call back and we can go ahead with the order.

## 2018-06-20 DIAGNOSIS — Z79891 Long term (current) use of opiate analgesic: Secondary | ICD-10-CM | POA: Diagnosis not present

## 2018-06-20 DIAGNOSIS — M25552 Pain in left hip: Secondary | ICD-10-CM | POA: Diagnosis not present

## 2018-06-20 DIAGNOSIS — G2581 Restless legs syndrome: Secondary | ICD-10-CM | POA: Diagnosis not present

## 2018-06-20 DIAGNOSIS — M25572 Pain in left ankle and joints of left foot: Secondary | ICD-10-CM | POA: Diagnosis not present

## 2018-06-20 DIAGNOSIS — M545 Low back pain: Secondary | ICD-10-CM | POA: Diagnosis not present

## 2018-06-20 DIAGNOSIS — M25551 Pain in right hip: Secondary | ICD-10-CM | POA: Insufficient documentation

## 2018-06-20 DIAGNOSIS — G894 Chronic pain syndrome: Secondary | ICD-10-CM | POA: Diagnosis not present

## 2018-06-20 NOTE — Assessment & Plan Note (Signed)
He has had 2 surgeries on each hip.  He has had a septic hip on the left.  Pain seems to be mostly related to his groin area.  Could be associated with weakness.  Unclear if is associated with any loose components. -Mobic. -May need to perform a bone scan or a CT in order to evaluate further with the x-ray not revealing for any changes

## 2018-06-22 ENCOUNTER — Telehealth: Payer: Self-pay | Admitting: Nurse Practitioner

## 2018-06-22 DIAGNOSIS — N390 Urinary tract infection, site not specified: Secondary | ICD-10-CM

## 2018-06-22 NOTE — Telephone Encounter (Signed)
Copied from CRM [id number]. Topic: General - Other >> Jun 22, 2018 10:54 AM Leafy Roobinson, Norma J wrote: Reason for CRM: pt is calling and would like to [facility]. Pt did not pick up  medication from [facility] back on aug 22/2019. Pt can not afford to see urologist until next month

## 2018-06-23 ENCOUNTER — Telehealth: Payer: Self-pay | Admitting: Family Medicine

## 2018-06-23 NOTE — Telephone Encounter (Signed)
Copied from CRM [id number]. Topic: Inquiry >> Jun 23, 2018  1:27 PM Maia Pettiesrtiz, Kristie S wrote: Reason for CRM: Pt wanted to notify Dr. Jordan LikesSchmitz that pain is now going down outside of left leg and into his knee. He feels he may have a ruptured disc. He said that he's been using ice packs almost constantly. He is asking about a CT scan, MRI, or referral to Ortho/spine specialist. Please advise

## 2018-06-23 NOTE — Telephone Encounter (Signed)
Pt calling again to check on RX for macrobid. [facility] but he couldn't afford it. Apparently his insurance changed and only covers medications [facility]. Pt has appt next month with urology. Please resend the macrobid to:  [id number] [place] [place] - [address] [facility] [address] [phone] (Phone) [phone] (Fax)

## 2018-06-23 NOTE — Telephone Encounter (Signed)
Spoke with patient-states the pain is primarily located in his lower back and radiates to his left knee. He has not heard back from his insurance company regarding a referral to a spine specialist. Would like to be referred regardless, please advise.

## 2018-06-24 MED ORDER — NITROFURANTOIN MONOHYD MACRO 100 MG PO CAPS: 100.0000 mg | ORAL_CAPSULE | Freq: Two times a day (BID) | ORAL | 0 refills | Status: DC

## 2018-06-24 NOTE — Telephone Encounter (Signed)
Left detailed message informing pt of below.  

## 2018-06-24 NOTE — Telephone Encounter (Signed)
Refill has been sent. Please keep upcoming urology appointment.

## 2018-06-24 NOTE — Telephone Encounter (Signed)
Left VM for patient. If he calls back please have him speak with a nurse/CMA and let him know that I would try to get in and see Dr. Charlann Boxerlin. The [facility] can report results to patient.   If any questions then please take the best time and phone number to call and I will try to call him back.   Myra RudeSchmitz, Jakel Alphin E, MD [facility] 06/24/2018, 8:19 AM

## 2018-06-29 ENCOUNTER — Encounter: Payer: Self-pay | Admitting: Nurse Practitioner

## 2018-06-29 ENCOUNTER — Ambulatory Visit (INDEPENDENT_AMBULATORY_CARE_PROVIDER_SITE_OTHER): Payer: Medicare HMO | Admitting: Nurse Practitioner

## 2018-06-29 VITALS — BP 150/94 | HR 71 | Ht 71.0 in | Wt 240.0 lb

## 2018-06-29 DIAGNOSIS — I10 Essential (primary) hypertension: Secondary | ICD-10-CM

## 2018-06-29 MED ORDER — SPIRONOLACTONE 25 MG PO TABS: 25.0000 mg | ORAL_TABLET | Freq: Every day | ORAL | 1 refills | Status: DC

## 2018-06-29 NOTE — Progress Notes (Signed)
Name: Jeffrey [name].   MRN: [id number]    DOB: 1961-07-22   Date:06/29/2018       Progress Note  Subjective  Chief Complaint Follow up  HPI Jeffrey [name] is here today for HTN follow up. He is maintained on maintained on carvedilol 25 BID, amlodipine 10 dailyAnd clonidine 0.1mg ,  at his last office visit on 06/13/18 his BP was very elevated, he told me he was out of his amlodipine for about 2 days prior to his appointment. A refill was sent and he was instructed to resume amlodipine and his clonidine was increased to 0.1 BID. He tells me today he has been taking all medications daily as instructed since last OV. He does note feeling somewhat more tired with the clonidine. He has seen sports medicine since our last OV with referral to orthopedics for further evaluation of severe knee and leg pain, he has scheduled an appointment with orthopedics this Friday, and feels his pain is playing a role in his elevated BP readings. Denies syncope, headaches, chest pain, shortness of breath.   BP Readings from Last 3 Encounters:  06/29/18 (!) 150/94  06/17/18 (!) 186/96  06/13/18 (!) 180/100     Patient Active Problem List   Diagnosis Date Noted  . Bilateral hip pain 06/20/2018  . Hyperlipidemia 03/17/2018  . Vitamin D deficiency 02/09/2018  . Obese 08/15/2013  . Encounter for general adult medical examination with abnormal findings 07/02/2013  . History of total hip arthroplasty 01/29/2013  . CVA (cerebral infarction) 04/11/2012  . Nephrolithiasis 10/30/2011  . Septic hip (HCC) 12/28/2010  . Other chronic pain 04/02/2010  . GERD 04/02/2010  . Chronic kidney disease (CKD), stage II (mild) 07/22/2009  . BPH (benign prostatic hyperplasia) 07/19/2009  . INSOMNIA 06/20/2009  . HYPERTRIGLYCERIDEMIA 05/22/2008  . Depression 11/18/2007  . ATRIAL SEPTAL DEFECT 11/18/2007  . Testosterone deficiency 09/12/2007  . TOBACCO ABUSE 01/20/2007  . Essential hypertension 01/20/2007  . Low back pain  01/20/2007  . AVASCULAR NECROSIS, FEMORAL HEAD 01/20/2007    Past Surgical History:  Procedure Laterality Date  . IVC filter placement Right   . JOINT REPLACEMENT    . TOTAL HIP ARTHROPLASTY Bilateral    left x 2; right x 1  . TOTAL HIP REVISION Right 08/14/2013   Procedure: RIGHT TOTAL HIP REVISION;  Surgeon: [name], MD;  Location: [facility];  Service: Orthopedics;  Laterality: Right;  . Trochanteric bursa injection Left 03/01/13   Preferred Pain Management, Dr. Ardell Isaacs    Family History  Problem Relation Age of Onset  . Arthritis Mother   . Heart disease Mother   . Hypertension Mother     Social History   Socioeconomic History  . Marital status: Divorced    Spouse name: Not on file  . Number of children: 0  . Years of education: 78  . Highest education level: Not on file  Occupational History  . Occupation: Disability  Social Needs  . Financial resource strain: Not on file  . Food insecurity:    Worry: Not on file    Inability: Not on file  . Transportation needs:    Medical: Not on file    Non-medical: Not on file  Tobacco Use  . Smoking status: Current Every Day Smoker    Packs/day: 1.00    Years: 39.00    Pack years: 39.00    Types: Cigarettes    Last attempt to quit: 07/16/2013    Years since quitting: 4.9  .  Smokeless tobacco: Never Used  . Tobacco comment: Recently smoking 2 PPD  Substance and Sexual Activity  . Alcohol use: No    Alcohol/week: 0.0 standard drinks    Comment: Every couple of months  . Drug use: No  . Sexual activity: Never  Lifestyle  . Physical activity:    Days per week: Not on file    Minutes per session: Not on file  . Stress: Not on file  Relationships  . Social connections:    Talks on phone: Not on file    Gets together: Not on file    Attends religious service: Not on file    Active member of club or organization: Not on file    Attends meetings of clubs or organizations: Not on file    Relationship status:  Not on file  . Intimate partner violence:    Fear of current or ex partner: Not on file    Emotionally abused: Not on file    Physically abused: Not on file    Forced sexual activity: Not on file  Other Topics Concern  . Not on file  Social History Narrative  . Not on file     Current Outpatient Medications:  .  amLODipine (NORVASC) 10 MG tablet, Take 1 tablet (10 mg total) by mouth daily., Disp: 90 tablet, Rfl: 1 .  b complex vitamins tablet, Take 1 tablet by mouth daily., Disp: 100 tablet, Rfl: 3 .  carvedilol (COREG) 25 MG tablet, TAKE 1 TABLET (25 MG TOTAL) BY MOUTH 2 (TWO) TIMES DAILY WITH A MEAL., Disp: 60 tablet, Rfl: 11 .  Cholecalciferol (VITAMIN D3) 2000 units capsule, Take 1 capsule (2,000 Units total) by mouth daily., Disp: 100 capsule, Rfl: 3 .  citalopram (CELEXA) 20 MG tablet, Take 1 tablet (20 mg total) by mouth daily., Disp: 30 tablet, Rfl: 5 .  cloNIDine (CATAPRES) 0.1 MG tablet, Take 1 tablet (0.1 mg total) by mouth 2 (two) times daily., Disp: 60 tablet, Rfl: 1 .  meloxicam (MOBIC) 15 MG tablet, Take 1 tablet (15 mg total) by mouth daily., Disp: 30 tablet, Rfl: 0 .  nitrofurantoin, macrocrystal-monohydrate, (MACROBID) 100 MG capsule, Take 1 capsule (100 mg total) by mouth 2 (two) times daily., Disp: 14 capsule, Rfl: 0 .  oxyCODONE-acetaminophen (PERCOCET) 10-325 MG tablet, Take 1 tablet by mouth every 4 (four) hours as needed. for pain, Disp: , Rfl: 0 .  pantoprazole (PROTONIX) 40 MG tablet, TAKE 1 TABLET (40 MG TOTAL) BY MOUTH DAILY., Disp: 30 tablet, Rfl: 11  Allergies  Allergen Reactions  . Ace Inhibitors     Possible ACEi induced renal failure.   [name] Hctz [Hydrochlorothiazide]     Thiazide induced hypercalcemia   . Lipitor [Atorvastatin] Nausea And Vomiting  . Penicillins     REACTION: hives     ROS See HPI  Objective  Vitals:   06/29/18 1454  BP: (!) 150/94  Pulse: 71  SpO2: 94%  Weight: 240 lb (108.9 kg)  Height: 5\' 11"  (1.803 m)    Body mass  index is 33.47 kg/m.  Physical Exam Vital signs reviewed. Constitutional: Patient appears well-developed and well-nourished. No distress.  HENT: Head: Normocephalic and atraumatic. Nose: Nose normal. Mouth/Throat: Oropharynx is clear and moist. No oropharyngeal exudate.  Eyes: Conjunctivae and EOM are normal. Pupils are equal, round, and reactive to light. No scleral icterus.  Neck: Normal range of motion. Neck supple.  Cardiovascular: Normal rate, regular rhythm and normal heart sounds.Distal pulses intact.  Pulmonary/Chest: Effort  normal and breath sounds normal. No respiratory distress. Neurological: he is alert and oriented to person, place, and time. No cranial nerve deficit. Coordination, balance, strength, speecharenormal. Ambulatory with cane. Skin: Skin is warm and dry. No rash noted. No erythema.  Psychiatric: Patient has a normal mood and affect. behavior is normal. Judgment and thought content normal.   Assessment & Plan RTC in 2 weeks for F/U: HTN-adding spironolactone, repeat BMET

## 2018-06-29 NOTE — Assessment & Plan Note (Signed)
BP has greatly improved but remains elevated, will continue current meds and add spironolactone-dosing and side effects discussed Discussed the role of healthy, low sodium diet in the management of HTN and provided additional information on AVS RTC in 2 weeks for F/U-reck BP, BMET - spironolactone (ALDACTONE) 25 MG tablet; Take 1 tablet (25 mg total) by mouth daily.  Dispense: 30 tablet; Refill: 1

## 2018-06-29 NOTE — Patient Instructions (Signed)
Please start spironolactone 25 mg once daily Continue all other blood pressure medications as you have been taking  Please return in about 2 weeks for follow up   DASH Eating Plan DASH stands for "Dietary Approaches to Stop Hypertension." The DASH eating plan is a healthy eating plan that has been shown to reduce high blood pressure (hypertension). It may also reduce your risk for type 2 diabetes, heart disease, and stroke. The DASH eating plan may also help with weight loss. What are tips for following this plan? General guidelines  Avoid eating more than 2,300 mg (milligrams) of salt (sodium) a day. If you have hypertension, you may need to reduce your sodium intake to 1,500 mg a day.  Limit alcohol intake to no more than 1 drink a day for nonpregnant women and 2 drinks a day for men. One drink equals 12 oz of beer, 5 oz of wine, or 1 oz of hard liquor.  Work with your health care provider to maintain a healthy body weight or to lose weight. Ask what an ideal weight is for you.  Get at least 30 minutes of exercise that causes your heart to beat faster (aerobic exercise) most days of the week. Activities may include walking, swimming, or biking.  Work with your health care provider or diet and nutrition specialist (dietitian) to adjust your eating plan to your individual calorie needs. Reading food labels  Check food labels for the amount of sodium per serving. Choose foods with less than 5 percent of the Daily Value of sodium. Generally, foods with less than 300 mg of sodium per serving fit into this eating plan.  To find whole grains, look for the word "whole" as the first word in the ingredient list. Shopping  Buy products labeled as "low-sodium" or "no salt added."  Buy fresh foods. Avoid canned foods and premade or frozen meals. Cooking  Avoid adding salt when cooking. Use salt-free seasonings or herbs instead of table salt or sea salt. Check with your health care provider or  pharmacist before using salt substitutes.  Do not fry foods. Cook foods using healthy methods such as baking, boiling, grilling, and broiling instead.  Cook with heart-healthy oils, such as olive, canola, soybean, or sunflower oil. Meal planning   Eat a balanced diet that includes: ? 5 or more servings of fruits and vegetables each day. At each meal, try to fill half of your plate with fruits and vegetables. ? Up to 6-8 servings of whole grains each day. ? Less than 6 oz of lean meat, poultry, or fish each day. A 3-oz serving of meat is about the same size as a deck of cards. One egg equals 1 oz. ? 2 servings of low-fat dairy each day. ? A serving of nuts, seeds, or beans 5 times each week. ? Heart-healthy fats. Healthy fats called Omega-3 fatty acids are found in foods such as flaxseeds and coldwater fish, like sardines, salmon, and mackerel.  Limit how much you eat of the following: ? Canned or prepackaged foods. ? Food that is high in trans fat, such as fried foods. ? Food that is high in saturated fat, such as fatty meat. ? Sweets, desserts, sugary drinks, and other foods with added sugar. ? Full-fat dairy products.  Do not salt foods before eating.  Try to eat at least 2 vegetarian meals each week.  Eat more home-cooked food and less restaurant, buffet, and fast food.  When eating at a restaurant, ask that your  food be prepared with less salt or no salt, if possible. What foods are recommended? The items listed may not be a complete list. Talk with your dietitian about what dietary choices are best for you. Grains Whole-grain or whole-wheat bread. Whole-grain or whole-wheat pasta. Brown rice. Modena Morrow. Bulgur. Whole-grain and low-sodium cereals. Pita bread. Low-fat, low-sodium crackers. Whole-wheat flour tortillas. Vegetables Fresh or frozen vegetables (raw, steamed, roasted, or grilled). Low-sodium or reduced-sodium tomato and vegetable juice. Low-sodium or  reduced-sodium tomato sauce and tomato paste. Low-sodium or reduced-sodium canned vegetables. Fruits All fresh, dried, or frozen fruit. Canned fruit in natural juice (without added sugar). Meat and other protein foods Skinless chicken or [place]. Ground chicken or [place]. Pork with fat trimmed off. Fish and seafood. Egg whites. Dried beans, peas, or lentils. Unsalted nuts, nut butters, and seeds. Unsalted canned beans. Lean cuts of beef with fat trimmed off. Low-sodium, lean deli meat. Dairy Low-fat (1%) or fat-free (skim) milk. Fat-free, low-fat, or reduced-fat cheeses. Nonfat, low-sodium ricotta or cottage cheese. Low-fat or nonfat yogurt. Low-fat, low-sodium cheese. Fats and oils Soft margarine without trans fats. Vegetable oil. Low-fat, reduced-fat, or light mayonnaise and salad dressings (reduced-sodium). Canola, safflower, olive, soybean, and sunflower oils. Avocado. Seasoning and other foods Herbs. Spices. Seasoning mixes without salt. Unsalted popcorn and pretzels. Fat-free sweets. What foods are not recommended? The items listed may not be a complete list. Talk with your dietitian about what dietary choices are best for you. Grains Baked goods made with fat, such as croissants, muffins, or some breads. Dry pasta or rice meal packs. Vegetables Creamed or fried vegetables. Vegetables in a cheese sauce. Regular canned vegetables (not low-sodium or reduced-sodium). Regular canned tomato sauce and paste (not low-sodium or reduced-sodium). Regular tomato and vegetable juice (not low-sodium or reduced-sodium). Angie Fava. Olives. Fruits Canned fruit in a light or heavy syrup. Fried fruit. Fruit in cream or butter sauce. Meat and other protein foods Fatty cuts of meat. Ribs. Fried meat. Berniece Salines. Sausage. Bologna and other processed lunch meats. Salami. Fatback. Hotdogs. Bratwurst. Salted nuts and seeds. Canned beans with added salt. Canned or smoked fish. Whole eggs or egg yolks. Chicken or [place]  with skin. Dairy Whole or 2% milk, cream, and half-and-half. Whole or full-fat cream cheese. Whole-fat or sweetened yogurt. Full-fat cheese. Nondairy creamers. Whipped toppings. Processed cheese and cheese spreads. Fats and oils Butter. Stick margarine. Lard. Shortening. Ghee. Bacon fat. Tropical oils, such as coconut, palm kernel, or palm oil. Seasoning and other foods Salted popcorn and pretzels. Onion salt, garlic salt, seasoned salt, table salt, and sea salt. Worcestershire sauce. Tartar sauce. Barbecue sauce. Teriyaki sauce. Soy sauce, including reduced-sodium. Steak sauce. Canned and packaged gravies. Fish sauce. Oyster sauce. Cocktail sauce. Horseradish that you find on the shelf. Ketchup. Mustard. Meat flavorings and tenderizers. Bouillon cubes. Hot sauce and Tabasco sauce. Premade or packaged marinades. Premade or packaged taco seasonings. Relishes. Regular salad dressings. Where to find more information:  National Heart, Lung, and [place]: https://wilson-eaton.com/  American Heart Association: www.heart.org Summary  The DASH eating plan is a healthy eating plan that has been shown to reduce high blood pressure (hypertension). It may also reduce your risk for type 2 diabetes, heart disease, and stroke.  With the DASH eating plan, you should limit salt (sodium) intake to 2,300 mg a day. If you have hypertension, you may need to reduce your sodium intake to 1,500 mg a day.  When on the DASH eating plan, aim to eat more fresh fruits and vegetables,  whole grains, lean proteins, low-fat dairy, and heart-healthy fats.  Work with your health care provider or diet and nutrition specialist (dietitian) to adjust your eating plan to your individual calorie needs. This information is not intended to replace advice given to you by your health care provider. Make sure you discuss any questions you have with your health care provider. Document Released: 09/10/2011 Document Revised: 09/14/2016  Document Reviewed: 09/14/2016 Elsevier Interactive Patient Education  [employer].

## 2018-07-01 DIAGNOSIS — M5416 Radiculopathy, lumbar region: Secondary | ICD-10-CM | POA: Diagnosis not present

## 2018-07-14 ENCOUNTER — Ambulatory Visit: Payer: Medicare HMO | Admitting: Nurse Practitioner

## 2018-07-18 DIAGNOSIS — G894 Chronic pain syndrome: Secondary | ICD-10-CM | POA: Diagnosis not present

## 2018-07-18 DIAGNOSIS — Z79891 Long term (current) use of opiate analgesic: Secondary | ICD-10-CM | POA: Diagnosis not present

## 2018-07-18 DIAGNOSIS — M545 Low back pain: Secondary | ICD-10-CM | POA: Diagnosis not present

## 2018-07-18 DIAGNOSIS — G2581 Restless legs syndrome: Secondary | ICD-10-CM | POA: Diagnosis not present

## 2018-07-18 DIAGNOSIS — M25572 Pain in left ankle and joints of left foot: Secondary | ICD-10-CM | POA: Diagnosis not present

## 2018-07-18 DIAGNOSIS — M25552 Pain in left hip: Secondary | ICD-10-CM | POA: Diagnosis not present

## 2018-07-21 ENCOUNTER — Other Ambulatory Visit (INDEPENDENT_AMBULATORY_CARE_PROVIDER_SITE_OTHER): Payer: Medicare HMO

## 2018-07-21 ENCOUNTER — Encounter: Payer: Self-pay | Admitting: Nurse Practitioner

## 2018-07-21 ENCOUNTER — Ambulatory Visit (INDEPENDENT_AMBULATORY_CARE_PROVIDER_SITE_OTHER): Payer: Medicare HMO | Admitting: Nurse Practitioner

## 2018-07-21 VITALS — BP 130/84 | HR 72 | Ht 71.0 in | Wt 234.0 lb

## 2018-07-21 DIAGNOSIS — E785 Hyperlipidemia, unspecified: Secondary | ICD-10-CM

## 2018-07-21 DIAGNOSIS — N390 Urinary tract infection, site not specified: Secondary | ICD-10-CM

## 2018-07-21 DIAGNOSIS — I10 Essential (primary) hypertension: Secondary | ICD-10-CM | POA: Diagnosis not present

## 2018-07-21 DIAGNOSIS — Z598 Other problems related to housing and economic circumstances: Secondary | ICD-10-CM

## 2018-07-21 DIAGNOSIS — M25551 Pain in right hip: Secondary | ICD-10-CM

## 2018-07-21 DIAGNOSIS — Z599 Problem related to housing and economic circumstances, unspecified: Secondary | ICD-10-CM | POA: Insufficient documentation

## 2018-07-21 DIAGNOSIS — M25552 Pain in left hip: Secondary | ICD-10-CM | POA: Diagnosis not present

## 2018-07-21 LAB — COMPREHENSIVE METABOLIC PANEL
ALK PHOS: 78 U/L (ref 39–117)
ALT: 32 U/L (ref 0–53)
AST: 30 U/L (ref 0–37)
Albumin: 4.3 g/dL (ref 3.5–5.2)
BILIRUBIN TOTAL: 0.5 mg/dL (ref 0.2–1.2)
BUN: 26 mg/dL — ABNORMAL HIGH (ref 6–23)
CALCIUM: 9.7 mg/dL (ref 8.4–10.5)
CO2: 28 meq/L (ref 19–32)
Chloride: 100 mEq/L (ref 96–112)
Creatinine, Ser: 1.15 mg/dL (ref 0.40–1.50)
GFR: 69.6 mL/min (ref >60.00)
Glucose, Bld: 96 mg/dL (ref 70–99)
Potassium: 4.6 mEq/L (ref 3.5–5.1)
Sodium: 134 mEq/L — ABNORMAL LOW (ref 135–145)
TOTAL PROTEIN: 7.1 g/dL (ref 6.0–8.3)

## 2018-07-21 LAB — URINALYSIS, ROUTINE W REFLEX MICROSCOPIC
Bilirubin Urine: NEGATIVE
Ketones, ur: NEGATIVE
Nitrite: NEGATIVE
RBC / HPF: NONE SEEN (ref 0–?)
Specific Gravity, Urine: 1.015 (ref 1.000–1.030)
Total Protein, Urine: NEGATIVE
URINE GLUCOSE: NEGATIVE
Urobilinogen, UA: 0.2 (ref 0.0–1.0)
pH: 6 (ref 5.0–8.0)

## 2018-07-21 LAB — BASIC METABOLIC PANEL
BUN: 26 mg/dL — ABNORMAL HIGH (ref 6–23)
CALCIUM: 9.7 mg/dL (ref 8.4–10.5)
CHLORIDE: 100 meq/L (ref 96–112)
CO2: 28 meq/L (ref 19–32)
Creatinine, Ser: 1.15 mg/dL (ref 0.40–1.50)
GFR: 69.6 mL/min (ref >60.00)
GLUCOSE: 96 mg/dL (ref 70–99)
POTASSIUM: 4.6 meq/L (ref 3.5–5.1)
SODIUM: 134 meq/L — AB (ref 135–145)

## 2018-07-21 LAB — LDL CHOLESTEROL, DIRECT: Direct LDL: 117 mg/dL

## 2018-07-21 LAB — LIPID PANEL
Cholesterol: 178 mg/dL (ref 0–200)
HDL: 37 mg/dL — ABNORMAL LOW (ref >39.00)
NONHDL: 140.93
TRIGLYCERIDES: 321 mg/dL — AB (ref 0.0–149.0)
Total CHOL/HDL Ratio: 5
VLDL: 64.2 mg/dL — ABNORMAL HIGH (ref 0.0–40.0)

## 2018-07-21 NOTE — Patient Instructions (Addendum)
Please head downstairs for lab work.

## 2018-07-21 NOTE — Assessment & Plan Note (Signed)
Will update urinalysis today If his urine testing is normal, ortho could proceed with epidural injection, however we discussed that he still needs to see urology for further evaluation of recurrent UTI I will follow up with him with results He will continue f/u with ortho for hip pain as planned

## 2018-07-21 NOTE — Progress Notes (Signed)
Jeffrey [name]. is a 57 y.o. male with the following history as recorded in [facility]:  Patient Active Problem List   Diagnosis Date Noted  . Bilateral hip pain 06/20/2018  . Hyperlipidemia 03/17/2018  . Vitamin D deficiency 02/09/2018  . Obese 08/15/2013  . Encounter for general adult medical examination with abnormal findings 07/02/2013  . History of total hip arthroplasty 01/29/2013  . CVA (cerebral infarction) 04/11/2012  . Nephrolithiasis 10/30/2011  . Septic hip (HCC) 12/28/2010  . Other chronic pain 04/02/2010  . GERD 04/02/2010  . Chronic kidney disease (CKD), stage II (mild) 07/22/2009  . BPH (benign prostatic hyperplasia) 07/19/2009  . INSOMNIA 06/20/2009  . HYPERTRIGLYCERIDEMIA 05/22/2008  . Depression 11/18/2007  . ATRIAL SEPTAL DEFECT 11/18/2007  . Testosterone deficiency 09/12/2007  . TOBACCO ABUSE 01/20/2007  . Essential hypertension 01/20/2007  . Low back pain 01/20/2007  . AVASCULAR NECROSIS, FEMORAL HEAD 01/20/2007    Current Outpatient Medications  Medication Sig Dispense Refill  . amLODipine (NORVASC) 10 MG tablet Take 1 tablet (10 mg total) by mouth daily. 90 tablet 1  . b complex vitamins tablet Take 1 tablet by mouth daily. 100 tablet 3  . carvedilol (COREG) 25 MG tablet TAKE 1 TABLET (25 MG TOTAL) BY MOUTH 2 (TWO) TIMES DAILY WITH A MEAL. 60 tablet 11  . Cholecalciferol (VITAMIN D3) 2000 units capsule Take 1 capsule (2,000 Units total) by mouth daily. 100 capsule 3  . citalopram (CELEXA) 20 MG tablet Take 1 tablet (20 mg total) by mouth daily. 30 tablet 5  . cloNIDine (CATAPRES) 0.1 MG tablet Take 1 tablet (0.1 mg total) by mouth 2 (two) times daily. 60 tablet 1  . meloxicam (MOBIC) 15 MG tablet Take 1 tablet (15 mg total) by mouth daily. 30 tablet 0  . oxyCODONE-acetaminophen (PERCOCET) 10-325 MG tablet Take 1 tablet by mouth every 4 (four) hours as needed. for pain  0  . pantoprazole (PROTONIX) 40 MG tablet TAKE 1 TABLET (40 MG TOTAL) BY MOUTH DAILY.  30 tablet 11  . spironolactone (ALDACTONE) 25 MG tablet Take 1 tablet (25 mg total) by mouth daily. 30 tablet 1   No current facility-administered medications for this visit.     Allergies: Ace inhibitors; Hctz [hydrochlorothiazide]; Lipitor [atorvastatin]; and Penicillins  Past Medical History:  Diagnosis Date  . Allergy   . Arthritis   . Closed fracture of lateral malleolus 01/04/2008   Qualifier: History of  By: Sharen Hones  MD, Wynona Canes    . Complication of anesthesia    "problems with my breathing- states was told had sleep apnea, but not ever tested"  . Degenerative disc disease, lumbar   . Depression   . GERD (gastroesophageal reflux disease)   . Heart murmur    has hole in his heart  . Hyperlipidemia   . Hypertension   . Kidney calculus   . Peripheral vascular disease (HCC)    DVT 2004-  left leg  . Presence of IVC filter    right femoral  . Sleep apnea    undiagnosed  . Stroke [facility]) 2004   patient has residual numbness  rt arm    Past Surgical History:  Procedure Laterality Date  . IVC filter placement Right   . JOINT REPLACEMENT    . TOTAL HIP ARTHROPLASTY Bilateral    left x 2; right x 1  . TOTAL HIP REVISION Right 08/14/2013   Procedure: RIGHT TOTAL HIP REVISION;  Surgeon: [name], MD;  Location: [facility];  Service: Orthopedics;  Laterality: Right;  . Trochanteric bursa injection Left 03/01/13   Preferred Pain Management, Dr. Ardell Isaacs    Family History  Problem Relation Age of Onset  . Arthritis Mother   . Heart disease Mother   . Hypertension Mother     Social History   Tobacco Use  . Smoking status: Current Every Day Smoker    Packs/day: 1.00    Years: 39.00    Pack years: 39.00    Types: Cigarettes    Last attempt to quit: 07/16/2013    Years since quitting: 5.0  . Smokeless tobacco: Never Used  . Tobacco comment: Recently smoking 2 PPD  Substance Use Topics  . Alcohol use: No    Alcohol/week: 0.0 standard drinks    Comment: Every couple  of months     Subjective:  Mr Jeffrey [name] is here today for blood pressure follow up. He is also asking for clearance for epidural injection at [facility], I recently referred him for evaluation of bilateral hip pain, he can not afford recommended MRI but was told he could have epidural injection if PCP could clear him from kidney infection, hes had 2 abnormal urine cultures with antibiotic treatment here this year, I referred him to urology for further evaluation of this back in May but he could not afford to go for follow up. He denies any urinary burning, frequency, urgency, hematuria today. For his blood pressure, he is  maintained on amlodipine 10, carvedilol 25 bid, clonidine 0.1 BID and spironolactone 25 daily was added at his last OV due to continued elevated blood pressure readings. He reports he has added spirinolactone daily as instructed, tolerating well. Does not check BP readings at home. No headaches, vision changes, chest pain, shortness of breath, edema.  BP Readings from Last 3 Encounters:  07/21/18 130/84  06/29/18 (!) 150/94  06/17/18 (!) 186/96   ROS- See HPI  Objective:  Vitals:   07/21/18 1554  BP: 130/84  Pulse: 72  SpO2: 96%  Weight: 234 lb (106.1 kg)  Height: 5\' 11"  (1.803 m)    General: Well developed, well nourished Skin : Warm and dry.  Head: Normocephalic and atraumatic  Eyes: Sclera and conjunctiva clear; pupils round and reactive to light; extraocular movements intact  Oropharynx: Pink, supple.  Neck: Supple Lungs: Respirations unlabored; no respiratory distress CVS exam: normal rate and regular rhythm.  Musculoskeletal: No deformities Extremities: No cyanosis Vessels: Symmetric bilaterally  Neurologic: Alert and oriented; speech intact; face symmetrical; moves all extremities well; CNII-XII intact without focal deficit   Physical Exam   Assessment:  1. Essential hypertension   2. Urinary tract infection without hematuria, site unspecified      Plan:   Return in about 3 months (around 10/21/2018) for F/U:  HTN- recheck BP.  Orders Placed This Encounter  Procedures  . Basic metabolic panel    Standing Status:   Future    Standing Expiration Date:   07/22/2019  . Urinalysis, Routine w reflex microscopic    Standing Status:   Future    Standing Expiration Date:   07/21/2019    Requested Prescriptions    No prescriptions requested or ordered in this encounter    Urinary tract infection without hematuria, site unspecified Will update urinalysis today If his urine testing is normal, ortho could proceed with epidural injection, however recommended that he still needs to see urology for further evaluation of recurrent UTIs I will follow up with him with results - Urinalysis, Routine  w reflex microscopic; Future

## 2018-07-21 NOTE — Assessment & Plan Note (Signed)
BP improved with addition of spironolactone, continue current medications RTC in 3 months for F/U- recheck BP - Basic metabolic panel; Future

## 2018-07-21 NOTE — Assessment & Plan Note (Signed)
He has been unable to f/u with multiple referrals now due to financial strain I offered referral to [facility] today to see if this would benefit him, he declined

## 2018-07-25 ENCOUNTER — Other Ambulatory Visit: Payer: Self-pay | Admitting: Nurse Practitioner

## 2018-07-25 MED ORDER — NITROFURANTOIN MACROCRYSTAL 100 MG PO CAPS: 100.0000 mg | ORAL_CAPSULE | Freq: Two times a day (BID) | ORAL | 0 refills | Status: DC

## 2018-08-11 ENCOUNTER — Other Ambulatory Visit: Payer: Self-pay | Admitting: Nurse Practitioner

## 2018-08-11 DIAGNOSIS — I10 Essential (primary) hypertension: Secondary | ICD-10-CM

## 2018-08-17 DIAGNOSIS — G894 Chronic pain syndrome: Secondary | ICD-10-CM | POA: Diagnosis not present

## 2018-08-17 DIAGNOSIS — M545 Low back pain: Secondary | ICD-10-CM | POA: Diagnosis not present

## 2018-08-17 DIAGNOSIS — M25572 Pain in left ankle and joints of left foot: Secondary | ICD-10-CM | POA: Diagnosis not present

## 2018-08-17 DIAGNOSIS — Z79891 Long term (current) use of opiate analgesic: Secondary | ICD-10-CM | POA: Diagnosis not present

## 2018-08-17 DIAGNOSIS — G2581 Restless legs syndrome: Secondary | ICD-10-CM | POA: Diagnosis not present

## 2018-08-17 DIAGNOSIS — M25552 Pain in left hip: Secondary | ICD-10-CM | POA: Diagnosis not present

## 2018-08-20 ENCOUNTER — Other Ambulatory Visit: Payer: Self-pay | Admitting: Nurse Practitioner

## 2018-08-20 DIAGNOSIS — I10 Essential (primary) hypertension: Secondary | ICD-10-CM

## 2018-09-14 DIAGNOSIS — Z79891 Long term (current) use of opiate analgesic: Secondary | ICD-10-CM | POA: Diagnosis not present

## 2018-09-14 DIAGNOSIS — G2581 Restless legs syndrome: Secondary | ICD-10-CM | POA: Diagnosis not present

## 2018-09-14 DIAGNOSIS — M25552 Pain in left hip: Secondary | ICD-10-CM | POA: Diagnosis not present

## 2018-09-14 DIAGNOSIS — M25572 Pain in left ankle and joints of left foot: Secondary | ICD-10-CM | POA: Diagnosis not present

## 2018-09-14 DIAGNOSIS — G894 Chronic pain syndrome: Secondary | ICD-10-CM | POA: Diagnosis not present

## 2018-09-14 DIAGNOSIS — M545 Low back pain: Secondary | ICD-10-CM | POA: Diagnosis not present

## 2018-10-13 DIAGNOSIS — G894 Chronic pain syndrome: Secondary | ICD-10-CM | POA: Diagnosis not present

## 2018-10-13 DIAGNOSIS — Z79891 Long term (current) use of opiate analgesic: Secondary | ICD-10-CM | POA: Diagnosis not present

## 2018-10-13 DIAGNOSIS — G2581 Restless legs syndrome: Secondary | ICD-10-CM | POA: Diagnosis not present

## 2018-10-13 DIAGNOSIS — M545 Low back pain: Secondary | ICD-10-CM | POA: Diagnosis not present

## 2018-10-13 DIAGNOSIS — M25552 Pain in left hip: Secondary | ICD-10-CM | POA: Diagnosis not present

## 2018-10-13 DIAGNOSIS — M25572 Pain in left ankle and joints of left foot: Secondary | ICD-10-CM | POA: Diagnosis not present

## 2018-10-26 ENCOUNTER — Encounter: Payer: Self-pay | Admitting: Nurse Practitioner

## 2018-10-26 ENCOUNTER — Other Ambulatory Visit (INDEPENDENT_AMBULATORY_CARE_PROVIDER_SITE_OTHER): Payer: Medicare HMO

## 2018-10-26 ENCOUNTER — Ambulatory Visit (INDEPENDENT_AMBULATORY_CARE_PROVIDER_SITE_OTHER): Payer: Medicare HMO | Admitting: Nurse Practitioner

## 2018-10-26 VITALS — BP 140/90 | HR 75 | Ht 71.0 in | Wt 248.0 lb

## 2018-10-26 DIAGNOSIS — K219 Gastro-esophageal reflux disease without esophagitis: Secondary | ICD-10-CM

## 2018-10-26 DIAGNOSIS — R69 Illness, unspecified: Secondary | ICD-10-CM | POA: Diagnosis not present

## 2018-10-26 DIAGNOSIS — I10 Essential (primary) hypertension: Secondary | ICD-10-CM | POA: Diagnosis not present

## 2018-10-26 DIAGNOSIS — F339 Major depressive disorder, recurrent, unspecified: Secondary | ICD-10-CM

## 2018-10-26 LAB — BASIC METABOLIC PANEL
BUN: 11 mg/dL (ref 6–23)
CO2: 30 meq/L (ref 19–32)
CREATININE: 1.1 mg/dL (ref 0.40–1.50)
Calcium: 9.6 mg/dL (ref 8.4–10.5)
Chloride: 98 mEq/L (ref 96–112)
GFR: 68.86 mL/min (ref >60.00)
Glucose, Bld: 100 mg/dL — ABNORMAL HIGH (ref 70–99)
Potassium: 3.9 mEq/L (ref 3.5–5.1)
Sodium: 133 mEq/L — ABNORMAL LOW (ref 135–145)

## 2018-10-26 MED ORDER — CITALOPRAM HYDROBROMIDE 20 MG PO TABS: 20.0000 mg | ORAL_TABLET | Freq: Every day | ORAL | 5 refills | Status: DC

## 2018-10-26 MED ORDER — PANTOPRAZOLE SODIUM 40 MG PO TBEC: DELAYED_RELEASE_TABLET | ORAL | 11 refills | Status: DC

## 2018-10-26 NOTE — Assessment & Plan Note (Signed)
Stable Continue current medication F/U for new, worsening symptoms - pantoprazole (PROTONIX) 40 MG tablet; TAKE 1 TABLET (40 MG TOTAL) BY MOUTH DAILY.  Dispense: 30 tablet; Refill: 11

## 2018-10-26 NOTE — Patient Instructions (Addendum)
Head downstairs for labs  Please try to check your blood pressure once daily or at least a few times a week, at the same time each day, and keep a log. Please follow up for home readings >140/90.

## 2018-10-26 NOTE — Assessment & Plan Note (Signed)
BP slightly elevated here today with report of normal readings at home Continue current medications RTC for CPE in may, sooner for readings >140/90 at home - Basic metabolic panel; Future

## 2018-10-26 NOTE — Progress Notes (Signed)
Jeffrey [name]. is a 58 y.o. male with the following history as recorded in EpicCare:  Patient Active Problem List   Diagnosis Date Noted  . Financial difficulties 07/21/2018  . Bilateral hip pain 06/20/2018  . Hyperlipidemia 03/17/2018  . Vitamin D deficiency 02/09/2018  . Obese 08/15/2013  . Encounter for general adult medical examination with abnormal findings 07/02/2013  . History of total hip arthroplasty 01/29/2013  . CVA (cerebral infarction) 04/11/2012  . Nephrolithiasis 10/30/2011  . Septic hip (HCC) 12/28/2010  . Other chronic pain 04/02/2010  . GERD 04/02/2010  . Chronic kidney disease (CKD), stage II (mild) 07/22/2009  . BPH (benign prostatic hyperplasia) 07/19/2009  . INSOMNIA 06/20/2009  . HYPERTRIGLYCERIDEMIA 05/22/2008  . Depression 11/18/2007  . ATRIAL SEPTAL DEFECT 11/18/2007  . Testosterone deficiency 09/12/2007  . TOBACCO ABUSE 01/20/2007  . Essential hypertension 01/20/2007  . Low back pain 01/20/2007  . AVASCULAR NECROSIS, FEMORAL HEAD 01/20/2007    Current Outpatient Medications  Medication Sig Dispense Refill  . amLODipine (NORVASC) 10 MG tablet Take 1 tablet (10 mg total) by mouth daily. 90 tablet 1  . b complex vitamins tablet Take 1 tablet by mouth daily. 100 tablet 3  . carvedilol (COREG) 25 MG tablet TAKE 1 TABLET (25 MG TOTAL) BY MOUTH 2 (TWO) TIMES DAILY WITH A MEAL. 60 tablet 11  . Cholecalciferol (VITAMIN D3) 2000 units capsule Take 1 capsule (2,000 Units total) by mouth daily. 100 capsule 3  . citalopram (CELEXA) 20 MG tablet Take 1 tablet (20 mg total) by mouth daily. 30 tablet 5  . cloNIDine (CATAPRES) 0.1 MG tablet TAKE 1 TABLET (0.1 MG TOTAL) BY MOUTH 2 (TWO) TIMES DAILY. 60 tablet 3  . meloxicam (MOBIC) 15 MG tablet Take 1 tablet (15 mg total) by mouth daily. 30 tablet 0  . oxyCODONE-acetaminophen (PERCOCET) 10-325 MG tablet Take 1 tablet by mouth every 4 (four) hours as needed. for pain  0  . pantoprazole (PROTONIX) 40 MG tablet TAKE 1  TABLET (40 MG TOTAL) BY MOUTH DAILY. 30 tablet 11  . spironolactone (ALDACTONE) 25 MG tablet TAKE 1 TABLET BY MOUTH EVERY DAY 30 tablet 2   No current facility-administered medications for this visit.     Allergies: Ace inhibitors; Hctz [hydrochlorothiazide]; Lipitor [atorvastatin]; and Penicillins  Past Medical History:  Diagnosis Date  . Allergy   . Arthritis   . Closed fracture of lateral malleolus 01/04/2008   Qualifier: History of  By: Sharen Hones  MD, Wynona Canes    . Complication of anesthesia    "problems with my breathing- states was told had sleep apnea, but not ever tested"  . Degenerative disc disease, lumbar   . Depression   . GERD (gastroesophageal reflux disease)   . Heart murmur    has hole in his heart  . Hyperlipidemia   . Hypertension   . Kidney calculus   . Peripheral vascular disease (HCC)    DVT 2004-  left leg  . Presence of IVC filter    right femoral  . Sleep apnea    undiagnosed  . Stroke [facility]) 2004   patient has residual numbness  rt arm    Past Surgical History:  Procedure Laterality Date  . IVC filter placement Right   . JOINT REPLACEMENT    . TOTAL HIP ARTHROPLASTY Bilateral    left x 2; right x 1  . TOTAL HIP REVISION Right 08/14/2013   Procedure: RIGHT TOTAL HIP REVISION;  Surgeon: [name], MD;  Location:  [facility];  Service: Orthopedics;  Laterality: Right;  . Trochanteric bursa injection Left 03/01/13   Preferred Pain Management, Dr. Ardell Isaacsavid Spivey    Family History  Problem Relation Age of Onset  . Arthritis Mother   . Heart disease Mother   . Hypertension Mother     Social History   Tobacco Use  . Smoking status: Current Every Day Smoker    Packs/day: 1.00    Years: 39.00    Pack years: 39.00    Types: Cigarettes    Last attempt to quit: 07/16/2013    Years since quitting: 5.2  . Smokeless tobacco: Never Used  . Tobacco comment: Recently smoking 2 PPD  Substance Use Topics  . Alcohol use: No    Alcohol/week: 0.0 standard drinks     Comment: Every couple of months     Subjective:  Jeffrey [name] is here today for blood pressure follow up, also requesting refill of celexa and protonix. He is maintained on celexa for anxiety and depression, reports no noted adverse effects, reports good improvement in mood with celexa, actually ran out a few weeks ago and has not called for refill, he has noticed mood is not as good without celexa, no thoughts of hurting himself or others. He is maintained on protonix for GERD, taking daily as prescribed without noted adverse effects, does notice symptoms of GERD if he ever misses doses of protonix  Hypertension -maintained on for his blood pressure, he is maintained on amlodipine 10, carvedilol 25 bid, clonidine 0.1 BID and spironolactone 25 daily. Reports daily medication compliance without noted adverse medication effects. Reports he checks his blood pressure at home occasionally, readings have been normal, feels his reading is up today due to being out of celexa and feeling more anxious.  Denies headaches, vision changes, chest pain, shortness of breath, edema.  BP Readings from Last 3 Encounters:  10/26/18 140/90  07/21/18 130/84  06/29/18 (!) 150/94    ROS- See HPI  Objective:  Vitals:   10/26/18 1454  BP: 140/90  Pulse: 75  SpO2: 96%  Weight: 248 lb (112.5 kg)  Height: 5\' 11"  (1.803 m)    General: Well developed, well nourished, in no acute distress  Skin : Warm and dry.  Head: Normocephalic and atraumatic  Eyes: Sclera and conjunctiva clear; pupils round and reactive to light; extraocular movements intact  Oropharynx: Pink, supple. No suspicious lesions  Neck: Supple without thyromegaly, adenopathy  Lungs: Respirations unlabored; clear to auscultation bilaterally  CVS exam: normal rate and regular rhythm, S1 and S2 normal.  Extremities: No edema, cyanosis, clubbing  Vessels: Symmetric bilaterally  Neurologic: Alert and oriented; speech intact; face symmetrical; moves  all extremities well; CNII-XII intact without focal deficit  Psychiatric: Normal mood and affect.  Assessment:  1. Episode of recurrent major depressive disorder, unspecified depression episode severity (HCC)   2. Essential hypertension   3. Gastroesophageal reflux disease, esophagitis presence not specified     Plan:   Return in about 4 months (around 02/24/2019) for CPE.  No orders of the defined types were placed in this encounter.   Requested Prescriptions   Pending Prescriptions Disp Refills  . citalopram (CELEXA) 20 MG tablet 30 tablet 5    Sig: Take 1 tablet (20 mg total) by mouth daily.  . pantoprazole (PROTONIX) 40 MG tablet 30 tablet 11    Sig: TAKE 1 TABLET (40 MG TOTAL) BY MOUTH DAILY.

## 2018-10-26 NOTE — Assessment & Plan Note (Signed)
Resume celexa-medication dosing, side effects discussed RTC in May for CPE as planned, sooner for any new, worsening symptoms - citalopram (CELEXA) 20 MG tablet; Take 1 tablet (20 mg total) by mouth daily.  Dispense: 30 tablet; Refill: 5

## 2018-11-16 DIAGNOSIS — M25552 Pain in left hip: Secondary | ICD-10-CM | POA: Diagnosis not present

## 2018-11-16 DIAGNOSIS — M545 Low back pain: Secondary | ICD-10-CM | POA: Diagnosis not present

## 2018-11-16 DIAGNOSIS — M25572 Pain in left ankle and joints of left foot: Secondary | ICD-10-CM | POA: Diagnosis not present

## 2018-11-16 DIAGNOSIS — G894 Chronic pain syndrome: Secondary | ICD-10-CM | POA: Diagnosis not present

## 2018-11-16 DIAGNOSIS — G2581 Restless legs syndrome: Secondary | ICD-10-CM | POA: Diagnosis not present

## 2018-11-16 DIAGNOSIS — Z79891 Long term (current) use of opiate analgesic: Secondary | ICD-10-CM | POA: Diagnosis not present

## 2018-12-06 ENCOUNTER — Other Ambulatory Visit: Payer: Self-pay | Admitting: *Deleted

## 2018-12-06 DIAGNOSIS — I10 Essential (primary) hypertension: Secondary | ICD-10-CM

## 2018-12-06 MED ORDER — SPIRONOLACTONE 25 MG PO TABS: 25.0000 mg | ORAL_TABLET | Freq: Every day | ORAL | 2 refills | Status: DC

## 2018-12-07 ENCOUNTER — Other Ambulatory Visit: Payer: Self-pay | Admitting: *Deleted

## 2018-12-07 DIAGNOSIS — I10 Essential (primary) hypertension: Secondary | ICD-10-CM

## 2018-12-07 MED ORDER — AMLODIPINE BESYLATE 10 MG PO TABS: 10.0000 mg | ORAL_TABLET | Freq: Every day | ORAL | 1 refills | Status: DC

## 2018-12-12 ENCOUNTER — Other Ambulatory Visit: Payer: Self-pay | Admitting: *Deleted

## 2018-12-12 DIAGNOSIS — I10 Essential (primary) hypertension: Secondary | ICD-10-CM

## 2018-12-12 MED ORDER — CLONIDINE HCL 0.1 MG PO TABS: 0.1000 mg | ORAL_TABLET | Freq: Two times a day (BID) | ORAL | 3 refills | Status: DC

## 2018-12-14 DIAGNOSIS — G894 Chronic pain syndrome: Secondary | ICD-10-CM | POA: Diagnosis not present

## 2018-12-14 DIAGNOSIS — Z79891 Long term (current) use of opiate analgesic: Secondary | ICD-10-CM | POA: Diagnosis not present

## 2018-12-14 DIAGNOSIS — M545 Low back pain: Secondary | ICD-10-CM | POA: Diagnosis not present

## 2018-12-14 DIAGNOSIS — G2581 Restless legs syndrome: Secondary | ICD-10-CM | POA: Diagnosis not present

## 2018-12-14 DIAGNOSIS — M25552 Pain in left hip: Secondary | ICD-10-CM | POA: Diagnosis not present

## 2018-12-14 DIAGNOSIS — M25572 Pain in left ankle and joints of left foot: Secondary | ICD-10-CM | POA: Diagnosis not present

## 2019-01-11 DIAGNOSIS — M25572 Pain in left ankle and joints of left foot: Secondary | ICD-10-CM | POA: Diagnosis not present

## 2019-01-11 DIAGNOSIS — G894 Chronic pain syndrome: Secondary | ICD-10-CM | POA: Diagnosis not present

## 2019-01-11 DIAGNOSIS — Z79891 Long term (current) use of opiate analgesic: Secondary | ICD-10-CM | POA: Diagnosis not present

## 2019-01-11 DIAGNOSIS — M25552 Pain in left hip: Secondary | ICD-10-CM | POA: Diagnosis not present

## 2019-01-11 DIAGNOSIS — G2581 Restless legs syndrome: Secondary | ICD-10-CM | POA: Diagnosis not present

## 2019-01-11 DIAGNOSIS — M545 Low back pain: Secondary | ICD-10-CM | POA: Diagnosis not present

## 2019-02-08 DIAGNOSIS — Z79891 Long term (current) use of opiate analgesic: Secondary | ICD-10-CM | POA: Diagnosis not present

## 2019-02-08 DIAGNOSIS — G2581 Restless legs syndrome: Secondary | ICD-10-CM | POA: Diagnosis not present

## 2019-02-08 DIAGNOSIS — G894 Chronic pain syndrome: Secondary | ICD-10-CM | POA: Diagnosis not present

## 2019-02-08 DIAGNOSIS — M545 Low back pain: Secondary | ICD-10-CM | POA: Diagnosis not present

## 2019-02-08 DIAGNOSIS — M25572 Pain in left ankle and joints of left foot: Secondary | ICD-10-CM | POA: Diagnosis not present

## 2019-02-08 DIAGNOSIS — M25552 Pain in left hip: Secondary | ICD-10-CM | POA: Diagnosis not present

## 2019-02-20 ENCOUNTER — Telehealth: Payer: Self-pay | Admitting: Internal Medicine

## 2019-02-20 NOTE — Telephone Encounter (Signed)
LVM for patient to call back to make appt  °

## 2019-02-20 NOTE — Telephone Encounter (Signed)
-----   Message from Corwin Levins, MD sent at 02/19/2019  9:10 PM EDT ----- Regarding: TOC and CPX Ok for [facility] with CPX next available

## 2019-02-24 ENCOUNTER — Encounter: Payer: Medicare HMO | Admitting: Nurse Practitioner

## 2019-03-08 ENCOUNTER — Other Ambulatory Visit: Payer: Self-pay | Admitting: *Deleted

## 2019-03-08 DIAGNOSIS — Z79891 Long term (current) use of opiate analgesic: Secondary | ICD-10-CM | POA: Diagnosis not present

## 2019-03-08 DIAGNOSIS — G2581 Restless legs syndrome: Secondary | ICD-10-CM | POA: Diagnosis not present

## 2019-03-08 DIAGNOSIS — M25572 Pain in left ankle and joints of left foot: Secondary | ICD-10-CM | POA: Diagnosis not present

## 2019-03-08 DIAGNOSIS — I10 Essential (primary) hypertension: Secondary | ICD-10-CM

## 2019-03-08 DIAGNOSIS — M25552 Pain in left hip: Secondary | ICD-10-CM | POA: Diagnosis not present

## 2019-03-08 DIAGNOSIS — G894 Chronic pain syndrome: Secondary | ICD-10-CM | POA: Diagnosis not present

## 2019-03-08 DIAGNOSIS — M545 Low back pain: Secondary | ICD-10-CM | POA: Diagnosis not present

## 2019-03-08 MED ORDER — SPIRONOLACTONE 25 MG PO TABS: 25.0000 mg | ORAL_TABLET | Freq: Every day | ORAL | 0 refills | Status: DC

## 2019-03-31 ENCOUNTER — Other Ambulatory Visit: Payer: Self-pay | Admitting: Internal Medicine

## 2019-03-31 DIAGNOSIS — I10 Essential (primary) hypertension: Secondary | ICD-10-CM

## 2019-04-03 ENCOUNTER — Telehealth: Payer: Self-pay | Admitting: Family

## 2019-04-03 DIAGNOSIS — I10 Essential (primary) hypertension: Secondary | ICD-10-CM

## 2019-04-03 MED ORDER — CLONIDINE HCL 0.1 MG PO TABS: 0.1000 mg | ORAL_TABLET | Freq: Two times a day (BID) | ORAL | 0 refills | Status: DC

## 2019-04-03 NOTE — Telephone Encounter (Signed)
He needs to schedule a TOC with Dr. Jenny Reichmann. I will send in one month of blood pressure refills for him.

## 2019-04-04 ENCOUNTER — Telehealth: Payer: Self-pay | Admitting: Internal Medicine

## 2019-04-04 NOTE — Telephone Encounter (Signed)
Left pt vm to call back to schedule transfer of care to [facility] per Valere Dross.

## 2019-04-05 NOTE — Telephone Encounter (Signed)
Left detailed message informing pt to call office and schedule TOC visit with Dr. Jenny Reichmann.

## 2019-04-10 DIAGNOSIS — G894 Chronic pain syndrome: Secondary | ICD-10-CM | POA: Diagnosis not present

## 2019-04-10 DIAGNOSIS — G2581 Restless legs syndrome: Secondary | ICD-10-CM | POA: Diagnosis not present

## 2019-04-10 DIAGNOSIS — M25572 Pain in left ankle and joints of left foot: Secondary | ICD-10-CM | POA: Diagnosis not present

## 2019-04-10 DIAGNOSIS — Z79891 Long term (current) use of opiate analgesic: Secondary | ICD-10-CM | POA: Diagnosis not present

## 2019-04-10 DIAGNOSIS — M25552 Pain in left hip: Secondary | ICD-10-CM | POA: Diagnosis not present

## 2019-04-10 DIAGNOSIS — M545 Low back pain: Secondary | ICD-10-CM | POA: Diagnosis not present

## 2019-04-21 ENCOUNTER — Ambulatory Visit (INDEPENDENT_AMBULATORY_CARE_PROVIDER_SITE_OTHER): Payer: Medicare HMO | Admitting: Internal Medicine

## 2019-04-21 ENCOUNTER — Other Ambulatory Visit (INDEPENDENT_AMBULATORY_CARE_PROVIDER_SITE_OTHER): Payer: Medicare HMO

## 2019-04-21 ENCOUNTER — Encounter: Payer: Self-pay | Admitting: Internal Medicine

## 2019-04-21 ENCOUNTER — Other Ambulatory Visit: Payer: Self-pay

## 2019-04-21 VITALS — BP 152/100 | HR 81 | Temp 99.0°F | Ht 71.0 in | Wt 239.0 lb

## 2019-04-21 DIAGNOSIS — Z0001 Encounter for general adult medical examination with abnormal findings: Secondary | ICD-10-CM | POA: Diagnosis not present

## 2019-04-21 DIAGNOSIS — R69 Illness, unspecified: Secondary | ICD-10-CM | POA: Diagnosis not present

## 2019-04-21 DIAGNOSIS — E559 Vitamin D deficiency, unspecified: Secondary | ICD-10-CM

## 2019-04-21 DIAGNOSIS — Z125 Encounter for screening for malignant neoplasm of prostate: Secondary | ICD-10-CM

## 2019-04-21 DIAGNOSIS — F339 Major depressive disorder, recurrent, unspecified: Secondary | ICD-10-CM

## 2019-04-21 DIAGNOSIS — I10 Essential (primary) hypertension: Secondary | ICD-10-CM

## 2019-04-21 DIAGNOSIS — N182 Chronic kidney disease, stage 2 (mild): Secondary | ICD-10-CM | POA: Diagnosis not present

## 2019-04-21 DIAGNOSIS — E538 Deficiency of other specified B group vitamins: Secondary | ICD-10-CM | POA: Diagnosis not present

## 2019-04-21 DIAGNOSIS — R739 Hyperglycemia, unspecified: Secondary | ICD-10-CM | POA: Diagnosis not present

## 2019-04-21 DIAGNOSIS — Z114 Encounter for screening for human immunodeficiency virus [HIV]: Secondary | ICD-10-CM | POA: Diagnosis not present

## 2019-04-21 DIAGNOSIS — E349 Endocrine disorder, unspecified: Secondary | ICD-10-CM

## 2019-04-21 DIAGNOSIS — E611 Iron deficiency: Secondary | ICD-10-CM

## 2019-04-21 DIAGNOSIS — E785 Hyperlipidemia, unspecified: Secondary | ICD-10-CM

## 2019-04-21 LAB — URINALYSIS, ROUTINE W REFLEX MICROSCOPIC
Bilirubin Urine: NEGATIVE
Ketones, ur: NEGATIVE
Nitrite: NEGATIVE
Specific Gravity, Urine: 1.02 (ref 1.000–1.030)
Urine Glucose: NEGATIVE
Urobilinogen, UA: 0.2 (ref 0.0–1.0)
pH: 6 (ref 5.0–8.0)

## 2019-04-21 LAB — CBC WITH DIFFERENTIAL/PLATELET
Basophils Absolute: 0.1 10*3/uL (ref 0.0–0.1)
Basophils Relative: 1.3 % (ref 0.0–3.0)
Eosinophils Absolute: 0.2 10*3/uL (ref 0.0–0.7)
Eosinophils Relative: 3 % (ref 0.0–5.0)
HCT: 45.1 % (ref 39.0–52.0)
Hemoglobin: 16 g/dL (ref 13.0–17.0)
Lymphocytes Relative: 26.9 % (ref 12.0–46.0)
Lymphs Abs: 1.9 10*3/uL (ref 0.7–4.0)
MCHC: 35.4 g/dL (ref 30.0–36.0)
MCV: 110.9 fl — ABNORMAL HIGH (ref 78.0–100.0)
Monocytes Absolute: 0.5 10*3/uL (ref 0.1–1.0)
Monocytes Relative: 7.3 % (ref 3.0–12.0)
Neutro Abs: 4.4 10*3/uL (ref 1.4–7.7)
Neutrophils Relative %: 61.5 % (ref 43.0–77.0)
Platelets: 171 10*3/uL (ref 150.0–400.0)
RBC: 4.07 Mil/uL — ABNORMAL LOW (ref 4.22–5.81)
RDW: 13.3 % (ref 11.5–15.5)
WBC: 7.1 10*3/uL (ref 4.0–10.5)

## 2019-04-21 LAB — HEPATIC FUNCTION PANEL
ALT: 58 U/L — ABNORMAL HIGH (ref 0–53)
AST: 61 U/L — ABNORMAL HIGH (ref 0–37)
Albumin: 4.3 g/dL (ref 3.5–5.2)
Alkaline Phosphatase: 87 U/L (ref 39–117)
Bilirubin, Direct: 0.1 mg/dL (ref 0.0–0.3)
Total Bilirubin: 0.5 mg/dL (ref 0.2–1.2)
Total Protein: 6.7 g/dL (ref 6.0–8.3)

## 2019-04-21 LAB — VITAMIN B12: Vitamin B-12: >1500 pg/mL — ABNORMAL HIGH (ref 211–911)

## 2019-04-21 LAB — HEMOGLOBIN A1C: Hgb A1c MFr Bld: 5.6 % (ref 4.6–6.5)

## 2019-04-21 LAB — BASIC METABOLIC PANEL
BUN: 15 mg/dL (ref 6–23)
CO2: 28 mEq/L (ref 19–32)
Calcium: 9.2 mg/dL (ref 8.4–10.5)
Chloride: 103 mEq/L (ref 96–112)
Creatinine, Ser: 1.12 mg/dL (ref 0.40–1.50)
GFR: 67.33 mL/min (ref >60.00)
Glucose, Bld: 94 mg/dL (ref 70–99)
Potassium: 4.1 mEq/L (ref 3.5–5.1)
Sodium: 138 mEq/L (ref 135–145)

## 2019-04-21 LAB — VITAMIN D 25 HYDROXY (VIT D DEFICIENCY, FRACTURES): VITD: 41.3 ng/mL (ref 30.00–100.00)

## 2019-04-21 LAB — LIPID PANEL
Cholesterol: 212 mg/dL — ABNORMAL HIGH (ref 0–200)
HDL: 59.4 mg/dL (ref >39.00)
NonHDL: 152.5
Total CHOL/HDL Ratio: 4
Triglycerides: 332 mg/dL — ABNORMAL HIGH (ref 0.0–149.0)
VLDL: 66.4 mg/dL — ABNORMAL HIGH (ref 0.0–40.0)

## 2019-04-21 LAB — IBC PANEL
Iron: 149 ug/dL (ref 42–165)
Saturation Ratios: 44.2 % (ref 20.0–50.0)
Transferrin: 241 mg/dL (ref 212.0–360.0)

## 2019-04-21 LAB — TESTOSTERONE: Testosterone: 122.48 ng/dL — ABNORMAL LOW (ref 300.00–890.00)

## 2019-04-21 LAB — LDL CHOLESTEROL, DIRECT: Direct LDL: 110 mg/dL

## 2019-04-21 LAB — PSA: PSA: 0.45 ng/mL (ref 0.10–4.00)

## 2019-04-21 LAB — TSH: TSH: 1.6 u[IU]/mL (ref 0.35–4.50)

## 2019-04-21 MED ORDER — CARVEDILOL 25 MG PO TABS: ORAL_TABLET | ORAL | 11 refills | Status: DC

## 2019-04-21 MED ORDER — ALBUTEROL SULFATE HFA 108 (90 BASE) MCG/ACT IN AERS: 2.0000 | INHALATION_SPRAY | Freq: Four times a day (QID) | RESPIRATORY_TRACT | 11 refills | Status: DC | PRN

## 2019-04-21 MED ORDER — TESTOSTERONE CYPIONATE 200 MG/ML IM SOLN: 300.0000 mg | INTRAMUSCULAR | 5 refills | Status: AC

## 2019-04-21 MED ORDER — CLONIDINE HCL 0.2 MG PO TABS: 0.2000 mg | ORAL_TABLET | Freq: Two times a day (BID) | ORAL | 3 refills | Status: DC

## 2019-04-21 MED ORDER — FLUTICASONE-SALMETEROL 250-50 MCG/DOSE IN AEPB: 1.0000 | INHALATION_SPRAY | Freq: Two times a day (BID) | RESPIRATORY_TRACT | 11 refills | Status: AC

## 2019-04-21 NOTE — Progress Notes (Signed)
Subjective:    Patient ID: Jeffrey [name]., male    DOB: 1961-04-14, 58 y.o.   MRN: [id number]  HPI    Here for wellness and f/u;  Overall doing ok; [name]  Pt denies neurological change such as new headache, facial or extremity weakness.  Pt denies polydipsia, polyuria, or low sugar symptoms. Pt states overall good compliance with treatment and medications, good tolerability, and has been trying to follow appropriate diet.No fever, night sweats, wt loss, loss of appetite, or other constitutional symptoms.  Pt states good ability with ADL's, has low fall risk, home safety reviewed and adequate, no other significant changes in hearing or vision, and not active with exercise Will evnetually need left ankle replacement due to DJD.  Only fall x 1, though, states he has been lucky.   Also with 2 mo worsening depressive symptoms, but no suicidal ideation, or panic; has ongoing anxiety, .  Asks for restart testost shots as had increased stamina and well being with prior shots. Has long standing uncontrolled HTN, somewhat resistant to further med increase.   Pt denies polydipsia, polyuria,  Pt states overall good compliance with meds, Pt denies chest pain,  orthopnea, PND, increased LE swelling, palpitations, dizziness or syncope, but has intermittent mild sob/doe/wheezing he just puts up with though has been improved with inhaler before.   Also states hx of MRSA with infection requiring 7 wks antibx Past Medical History:  Diagnosis Date  . Allergy   . Arthritis   . Closed fracture of lateral malleolus 01/04/2008   Qualifier: History of  By: Danise Mina  MD, Garlon Hatchet    . Complication of anesthesia    "problems with my breathing- states was told had sleep apnea, but not ever tested"  . Degenerative disc disease, lumbar   . Depression   . GERD (gastroesophageal reflux disease)   . Heart murmur    has hole in his heart  . Hyperlipidemia   . Hypertension   . Kidney calculus   . Peripheral vascular disease  ([place])    DVT 2004-  left leg  . Presence of IVC filter    right femoral  . Sleep apnea    undiagnosed  . Stroke [facility]) 2004   patient has residual numbness  rt arm   Past Surgical History:  Procedure Laterality Date  . IVC filter placement Right   . JOINT REPLACEMENT    . TOTAL HIP ARTHROPLASTY Bilateral    left x 2; right x 1  . TOTAL HIP REVISION Right 08/14/2013   Procedure: RIGHT TOTAL HIP REVISION;  Surgeon: Mauri Pole, MD;  Location: [facility];  Service: Orthopedics;  Laterality: Right;  . Trochanteric bursa injection Left 03/01/13   Preferred Pain Management, Dr. Dian Situ    reports that he has been smoking cigarettes. He has a 39.00 pack-year smoking history. He has never used smokeless tobacco. He reports that he does not drink alcohol or use drugs. family history includes Arthritis in his mother; Heart disease in his mother; Hypertension in his mother. Allergies  Allergen Reactions  . Ace Inhibitors     Possible ACEi induced renal failure.   [name] Hctz [Hydrochlorothiazide]     Thiazide induced hypercalcemia   . Lipitor [Atorvastatin] Nausea And Vomiting  . Penicillins     REACTION: hives  . Statins    Current Outpatient Medications on File Prior to Visit  Medication Sig Dispense Refill  . amLODipine (NORVASC) 10 MG tablet Take 1 tablet (10  mg total) by mouth daily. 90 tablet 1  . b complex vitamins tablet Take 1 tablet by mouth daily. 100 tablet 3  . Cholecalciferol (VITAMIN D3) 2000 units capsule Take 1 capsule (2,000 Units total) by mouth daily. 100 capsule 3  . citalopram (CELEXA) 20 MG tablet Take 1 tablet (20 mg total) by mouth daily. 30 tablet 5  . meloxicam (MOBIC) 15 MG tablet Take 1 tablet (15 mg total) by mouth daily. 30 tablet 0  . oxyCODONE-acetaminophen (PERCOCET) 10-325 MG tablet Take 1 tablet by mouth every 4 (four) hours as needed. for pain  0  . pantoprazole (PROTONIX) 40 MG tablet TAKE 1 TABLET (40 MG TOTAL) BY MOUTH DAILY. 30 tablet 11  .  rOPINIRole (REQUIP) 0.25 MG tablet TAKE 1 TABLET BY MOUTH 1 3 HRS BEFORE BEDTIME.    [name]. spironolactone (ALDACTONE) 25 MG tablet Take 1 tablet (25 mg total) by mouth daily. NEED TO ESTABLISH WITH NEW PROVIDER. 30 tablet 0   No current facility-administered medications on file prior to visit.    Review of Systems Constitutional: Negative for other unusual diaphoresis, sweats, appetite or weight changes HENT: Negative for other worsening hearing loss, ear pain, facial swelling, mouth sores or neck stiffness.   Eyes: Negative for other worsening pain, redness or other visual disturbance.  Respiratory: Negative for other stridor or swelling Cardiovascular: Negative for other palpitations or other chest pain  Gastrointestinal: Negative for worsening diarrhea or loose stools, blood in stool, distention or other pain Genitourinary: Negative for hematuria, flank pain or other change in urine volume.  Musculoskeletal: Negative for myalgias or other joint swelling.  Skin: Negative for other color change, or other wound or worsening drainage.  Neurological: Negative for other syncope or numbness. Hematological: Negative for other adenopathy or swelling Psychiatric/Behavioral: Negative for hallucinations, other worsening agitation, SI, self-injury, or new decreased concentration All other system neg per pt    Objective:   Physical Exam BP (!) 152/100 (BP Location: Left Arm, Patient Position: Sitting, Cuff Size: Large)   Pulse 81   Temp 99 F (37.2 C) (Oral)   Ht 5\' 11"  (1.803 m)   Wt 239 lb (108.4 kg)   SpO2 97%   BMI 33.33 kg/m  VS noted,  Constitutional: Pt is oriented to person, place, and time. Appears well-developed and well-nourished, in no significant distress and comfortable Head: Normocephalic and atraumatic  Eyes: Conjunctivae and EOM are normal. Pupils are equal, round, and reactive to light Right Ear: External ear normal without discharge Left Ear: External ear normal without  discharge Nose: Nose without discharge or deformity Mouth/Throat: Oropharynx is without other ulcerations and moist  Neck: Normal range of motion. Neck supple. No JVD present. No tracheal deviation present or significant neck LA or mass Cardiovascular: Normal rate, regular rhythm, normal heart sounds and intact distal pulses.   Pulmonary/Chest: WOB normal and breath sounds decreased no rales but with mild diffuse insp wheezing  Abdominal: Soft. Bowel sounds are normal. NT. No HSM  Musculoskeletal: Normal range of motion. Exhibits no edema Lymphadenopathy: Has no other cervical adenopathy.  Neurological: Pt is alert and oriented to person, place, and time. Pt has normal reflexes. No cranial nerve deficit. Motor grossly intact, Gait intact Skin: Skin is warm and dry. No rash noted or new ulcerations Psychiatric:  Has dysphoric mood and affect. Behavior is normal without agitation No other exam findings Lab Results  Component Value Date   WBC 7.1 04/21/2019   HGB 16.0 04/21/2019   HCT 45.1  04/21/2019   PLT 171.0 04/21/2019   GLUCOSE 94 04/21/2019   CHOL 212 (H) 04/21/2019   TRIG 332.0 (H) 04/21/2019   HDL 59.40 04/21/2019   LDLDIRECT 110.0 04/21/2019   LDLCALC 120 (H) 02/09/2018   ALT 58 (H) 04/21/2019   AST 61 (H) 04/21/2019   NA 138 04/21/2019   K 4.1 04/21/2019   CL 103 04/21/2019   CREATININE 1.12 04/21/2019   BUN 15 04/21/2019   CO2 28 04/21/2019   TSH 1.60 04/21/2019   PSA 0.45 04/21/2019   INR 0.85 08/07/2013   HGBA1C 5.6 04/21/2019       Assessment & Plan:

## 2019-04-21 NOTE — Patient Instructions (Addendum)
Please call if your insurance covers the Cologuard test  Ok to increase the clonidine to 0.2 mg twice per day  Please take all new medication as prescribed  - the albuterol and advair inhalers  Please continue all other medications as before, and refills have been done if requested.  Please have the pharmacy call with any other refills you may need.  Please continue your efforts at being more active, low cholesterol diet, and weight control.  You are otherwise up to date with prevention measures today.  Please keep your appointments with your specialists as you may have planned  Please go to the LAB in the Basement (turn left off the elevator) for the tests to be done today  You will be contacted by phone if any changes need to be made immediately.  Otherwise, you will receive a letter about your results with an explanation, but please check with MyChart first.  Please remember to sign up for MyChart if you have not done so, as this will be important to you in the future with finding out test results, communicating by private email, and scheduling acute appointments online when needed.  Please return in 6 months, or sooner if needed

## 2019-04-22 ENCOUNTER — Encounter: Payer: Self-pay | Admitting: Internal Medicine

## 2019-04-22 DIAGNOSIS — R7989 Other specified abnormal findings of blood chemistry: Secondary | ICD-10-CM | POA: Insufficient documentation

## 2019-04-22 LAB — HIV ANTIBODY (ROUTINE TESTING W REFLEX): HIV 1&2 Ab, 4th Generation: NONREACTIVE

## 2019-04-22 NOTE — Assessment & Plan Note (Signed)
Also for f/u lab, may need further replacement

## 2019-04-22 NOTE — Assessment & Plan Note (Signed)
stable overall by history and exam, recent data reviewed with pt, and pt to continue medical treatment as before,  to f/u any worsening symptoms or concerns, consider renal referral

## 2019-04-22 NOTE — Assessment & Plan Note (Signed)
For testosterone level,  to f/u any worsening symptoms or concerns 

## 2019-04-22 NOTE — Assessment & Plan Note (Signed)
Ok for increased clonidine to 0.2 though he is not too happy for med change today

## 2019-04-22 NOTE — Assessment & Plan Note (Addendum)
Suspect Chronic persistent, declines med change today or counseling referral  In addition to the time spent performing CPE, I spent an additional 25 minutes face to face,in which greater than 50% of this time was spent in counseling and coordination of care for patient's acute illness as documented, including the differential dx, treatment, further evaluation and other management of depression, CKD, HTN, Hyperglycemia, HLD, testosterone deficiency, and vit d deficiency

## 2019-04-22 NOTE — Assessment & Plan Note (Signed)
stable overall by history and exam, recent data reviewed with pt, and pt to continue medical treatment as before,  to f/u any worsening symptoms or concerns  

## 2019-04-22 NOTE — Assessment & Plan Note (Signed)

## 2019-05-03 DIAGNOSIS — G894 Chronic pain syndrome: Secondary | ICD-10-CM | POA: Diagnosis not present

## 2019-05-03 DIAGNOSIS — G2581 Restless legs syndrome: Secondary | ICD-10-CM | POA: Diagnosis not present

## 2019-05-03 DIAGNOSIS — Z79891 Long term (current) use of opiate analgesic: Secondary | ICD-10-CM | POA: Diagnosis not present

## 2019-05-03 DIAGNOSIS — M545 Low back pain: Secondary | ICD-10-CM | POA: Diagnosis not present

## 2019-05-03 DIAGNOSIS — M25572 Pain in left ankle and joints of left foot: Secondary | ICD-10-CM | POA: Diagnosis not present

## 2019-05-03 DIAGNOSIS — M25552 Pain in left hip: Secondary | ICD-10-CM | POA: Diagnosis not present

## 2019-05-27 ENCOUNTER — Other Ambulatory Visit: Payer: Self-pay | Admitting: Internal Medicine

## 2019-05-27 DIAGNOSIS — I10 Essential (primary) hypertension: Secondary | ICD-10-CM

## 2019-05-31 DIAGNOSIS — G2581 Restless legs syndrome: Secondary | ICD-10-CM | POA: Diagnosis not present

## 2019-05-31 DIAGNOSIS — Z79891 Long term (current) use of opiate analgesic: Secondary | ICD-10-CM | POA: Diagnosis not present

## 2019-05-31 DIAGNOSIS — G894 Chronic pain syndrome: Secondary | ICD-10-CM | POA: Diagnosis not present

## 2019-05-31 DIAGNOSIS — M545 Low back pain: Secondary | ICD-10-CM | POA: Diagnosis not present

## 2019-05-31 DIAGNOSIS — M25572 Pain in left ankle and joints of left foot: Secondary | ICD-10-CM | POA: Diagnosis not present

## 2019-05-31 DIAGNOSIS — M25552 Pain in left hip: Secondary | ICD-10-CM | POA: Diagnosis not present

## 2019-06-06 ENCOUNTER — Telehealth: Payer: Self-pay | Admitting: Internal Medicine

## 2019-06-06 DIAGNOSIS — F339 Major depressive disorder, recurrent, unspecified: Secondary | ICD-10-CM

## 2019-06-06 MED ORDER — CITALOPRAM HYDROBROMIDE 20 MG PO TABS: 20.0000 mg | ORAL_TABLET | Freq: Two times a day (BID) | ORAL | 11 refills | Status: DC

## 2019-06-06 NOTE — Addendum Note (Signed)
Addended by: Biagio Borg on: 06/06/2019 12:58 PM   Modules accepted: Orders

## 2019-06-06 NOTE — Telephone Encounter (Signed)
Refill for citalopram (CELEXA) 20 MG tablet   Pt would like to know if provider would increase to twice a day instead. Pt says that it helps better     Pharmacy:  [id number] - [place], [place] [phone] (Phone) [phone] (Fax)

## 2019-06-06 NOTE — Telephone Encounter (Signed)
Done erx 

## 2019-06-21 ENCOUNTER — Other Ambulatory Visit: Payer: Self-pay | Admitting: Family

## 2019-06-21 DIAGNOSIS — I10 Essential (primary) hypertension: Secondary | ICD-10-CM

## 2019-06-28 DIAGNOSIS — M25572 Pain in left ankle and joints of left foot: Secondary | ICD-10-CM | POA: Diagnosis not present

## 2019-06-28 DIAGNOSIS — G2581 Restless legs syndrome: Secondary | ICD-10-CM | POA: Diagnosis not present

## 2019-06-28 DIAGNOSIS — Z79891 Long term (current) use of opiate analgesic: Secondary | ICD-10-CM | POA: Diagnosis not present

## 2019-06-28 DIAGNOSIS — M25552 Pain in left hip: Secondary | ICD-10-CM | POA: Diagnosis not present

## 2019-06-28 DIAGNOSIS — G894 Chronic pain syndrome: Secondary | ICD-10-CM | POA: Diagnosis not present

## 2019-06-28 DIAGNOSIS — M545 Low back pain: Secondary | ICD-10-CM | POA: Diagnosis not present

## 2019-07-20 ENCOUNTER — Other Ambulatory Visit: Payer: Self-pay | Admitting: Internal Medicine

## 2019-07-20 DIAGNOSIS — I10 Essential (primary) hypertension: Secondary | ICD-10-CM

## 2019-07-24 DIAGNOSIS — M545 Low back pain: Secondary | ICD-10-CM | POA: Diagnosis not present

## 2019-07-24 DIAGNOSIS — G2581 Restless legs syndrome: Secondary | ICD-10-CM | POA: Diagnosis not present

## 2019-07-24 DIAGNOSIS — M25552 Pain in left hip: Secondary | ICD-10-CM | POA: Diagnosis not present

## 2019-07-24 DIAGNOSIS — M25572 Pain in left ankle and joints of left foot: Secondary | ICD-10-CM | POA: Diagnosis not present

## 2019-07-24 DIAGNOSIS — G894 Chronic pain syndrome: Secondary | ICD-10-CM | POA: Diagnosis not present

## 2019-07-24 DIAGNOSIS — Z79891 Long term (current) use of opiate analgesic: Secondary | ICD-10-CM | POA: Diagnosis not present

## 2019-08-03 ENCOUNTER — Other Ambulatory Visit: Payer: Self-pay | Admitting: Internal Medicine

## 2019-08-03 DIAGNOSIS — F339 Major depressive disorder, recurrent, unspecified: Secondary | ICD-10-CM

## 2019-08-21 DIAGNOSIS — Z79891 Long term (current) use of opiate analgesic: Secondary | ICD-10-CM | POA: Diagnosis not present

## 2019-08-21 DIAGNOSIS — G2581 Restless legs syndrome: Secondary | ICD-10-CM | POA: Diagnosis not present

## 2019-08-21 DIAGNOSIS — G894 Chronic pain syndrome: Secondary | ICD-10-CM | POA: Diagnosis not present

## 2019-08-21 DIAGNOSIS — M25572 Pain in left ankle and joints of left foot: Secondary | ICD-10-CM | POA: Diagnosis not present

## 2019-08-21 DIAGNOSIS — M25552 Pain in left hip: Secondary | ICD-10-CM | POA: Diagnosis not present

## 2019-08-21 DIAGNOSIS — M545 Low back pain: Secondary | ICD-10-CM | POA: Diagnosis not present

## 2019-08-28 ENCOUNTER — Other Ambulatory Visit: Payer: Self-pay | Admitting: *Deleted

## 2019-08-28 DIAGNOSIS — I10 Essential (primary) hypertension: Secondary | ICD-10-CM

## 2019-08-28 MED ORDER — AMLODIPINE BESYLATE 10 MG PO TABS: 10.0000 mg | ORAL_TABLET | Freq: Every day | ORAL | 1 refills | Status: DC

## 2019-09-18 ENCOUNTER — Other Ambulatory Visit: Payer: Self-pay | Admitting: *Deleted

## 2019-09-18 DIAGNOSIS — M25572 Pain in left ankle and joints of left foot: Secondary | ICD-10-CM | POA: Diagnosis not present

## 2019-09-18 DIAGNOSIS — G2581 Restless legs syndrome: Secondary | ICD-10-CM | POA: Diagnosis not present

## 2019-09-18 DIAGNOSIS — K219 Gastro-esophageal reflux disease without esophagitis: Secondary | ICD-10-CM

## 2019-09-18 DIAGNOSIS — Z79891 Long term (current) use of opiate analgesic: Secondary | ICD-10-CM | POA: Diagnosis not present

## 2019-09-18 DIAGNOSIS — G894 Chronic pain syndrome: Secondary | ICD-10-CM | POA: Diagnosis not present

## 2019-09-18 DIAGNOSIS — M545 Low back pain: Secondary | ICD-10-CM | POA: Diagnosis not present

## 2019-09-18 DIAGNOSIS — M25552 Pain in left hip: Secondary | ICD-10-CM | POA: Diagnosis not present

## 2019-09-18 MED ORDER — PANTOPRAZOLE SODIUM 40 MG PO TBEC: DELAYED_RELEASE_TABLET | ORAL | 11 refills | Status: DC

## 2019-10-16 ENCOUNTER — Other Ambulatory Visit: Payer: Self-pay | Admitting: Internal Medicine

## 2019-10-16 DIAGNOSIS — I10 Essential (primary) hypertension: Secondary | ICD-10-CM

## 2019-10-18 DIAGNOSIS — M25572 Pain in left ankle and joints of left foot: Secondary | ICD-10-CM | POA: Diagnosis not present

## 2019-10-18 DIAGNOSIS — G2581 Restless legs syndrome: Secondary | ICD-10-CM | POA: Diagnosis not present

## 2019-10-18 DIAGNOSIS — Z79891 Long term (current) use of opiate analgesic: Secondary | ICD-10-CM | POA: Diagnosis not present

## 2019-10-18 DIAGNOSIS — M25552 Pain in left hip: Secondary | ICD-10-CM | POA: Diagnosis not present

## 2019-10-18 DIAGNOSIS — M545 Low back pain: Secondary | ICD-10-CM | POA: Diagnosis not present

## 2019-10-18 DIAGNOSIS — G894 Chronic pain syndrome: Secondary | ICD-10-CM | POA: Diagnosis not present

## 2019-10-23 ENCOUNTER — Other Ambulatory Visit: Payer: Self-pay

## 2019-10-23 ENCOUNTER — Encounter: Payer: Self-pay | Admitting: Internal Medicine

## 2019-10-23 ENCOUNTER — Ambulatory Visit (INDEPENDENT_AMBULATORY_CARE_PROVIDER_SITE_OTHER): Payer: Medicare HMO | Admitting: Internal Medicine

## 2019-10-23 VITALS — BP 122/84 | HR 72 | Ht 71.0 in | Wt 230.0 lb

## 2019-10-23 DIAGNOSIS — Z0000 Encounter for general adult medical examination without abnormal findings: Secondary | ICD-10-CM | POA: Diagnosis not present

## 2019-10-23 DIAGNOSIS — Z125 Encounter for screening for malignant neoplasm of prostate: Secondary | ICD-10-CM | POA: Diagnosis not present

## 2019-10-23 DIAGNOSIS — R739 Hyperglycemia, unspecified: Secondary | ICD-10-CM

## 2019-10-23 DIAGNOSIS — Z23 Encounter for immunization: Secondary | ICD-10-CM

## 2019-10-23 DIAGNOSIS — E785 Hyperlipidemia, unspecified: Secondary | ICD-10-CM

## 2019-10-23 LAB — BASIC METABOLIC PANEL
BUN: 17 mg/dL (ref 6–23)
CO2: 29 mEq/L (ref 19–32)
Calcium: 9.5 mg/dL (ref 8.4–10.5)
Chloride: 98 mEq/L (ref 96–112)
Creatinine, Ser: 1.02 mg/dL (ref 0.40–1.50)
GFR: 74.87 mL/min (ref >60.00)
Glucose, Bld: 92 mg/dL (ref 70–99)
Potassium: 4.4 mEq/L (ref 3.5–5.1)
Sodium: 133 mEq/L — ABNORMAL LOW (ref 135–145)

## 2019-10-23 LAB — CBC WITH DIFFERENTIAL/PLATELET
Basophils Absolute: 0 10*3/uL (ref 0.0–0.1)
Basophils Relative: 0.9 % (ref 0.0–3.0)
Eosinophils Absolute: 0.2 10*3/uL (ref 0.0–0.7)
Eosinophils Relative: 3.3 % (ref 0.0–5.0)
HCT: 41.6 % (ref 39.0–52.0)
Hemoglobin: 14.5 g/dL (ref 13.0–17.0)
Lymphocytes Relative: 22.5 % (ref 12.0–46.0)
Lymphs Abs: 1.2 10*3/uL (ref 0.7–4.0)
MCHC: 34.8 g/dL (ref 30.0–36.0)
MCV: 119.1 fl — ABNORMAL HIGH (ref 78.0–100.0)
Monocytes Absolute: 0.7 10*3/uL (ref 0.1–1.0)
Monocytes Relative: 12.3 % — ABNORMAL HIGH (ref 3.0–12.0)
Neutro Abs: 3.4 10*3/uL (ref 1.4–7.7)
Neutrophils Relative %: 61 % (ref 43.0–77.0)
Platelets: 175 10*3/uL (ref 150.0–400.0)
RBC: 3.49 Mil/uL — ABNORMAL LOW (ref 4.22–5.81)
RDW: 13.1 % (ref 11.5–15.5)
WBC: 5.5 10*3/uL (ref 4.0–10.5)

## 2019-10-23 LAB — HEPATIC FUNCTION PANEL
ALT: 40 U/L (ref 0–53)
AST: 61 U/L — ABNORMAL HIGH (ref 0–37)
Albumin: 4.2 g/dL (ref 3.5–5.2)
Alkaline Phosphatase: 93 U/L (ref 39–117)
Bilirubin, Direct: 0.2 mg/dL (ref 0.0–0.3)
Total Bilirubin: 0.6 mg/dL (ref 0.2–1.2)
Total Protein: 6.7 g/dL (ref 6.0–8.3)

## 2019-10-23 LAB — URINALYSIS, ROUTINE W REFLEX MICROSCOPIC
Bilirubin Urine: NEGATIVE
Ketones, ur: NEGATIVE
Nitrite: NEGATIVE
Specific Gravity, Urine: 1.01 (ref 1.000–1.030)
Total Protein, Urine: NEGATIVE
Urine Glucose: NEGATIVE
Urobilinogen, UA: 0.2 (ref 0.0–1.0)
pH: 6 (ref 5.0–8.0)

## 2019-10-23 LAB — LIPID PANEL
Cholesterol: 180 mg/dL (ref 0–200)
HDL: 64.5 mg/dL (ref >39.00)
NonHDL: 115.11
Total CHOL/HDL Ratio: 3
Triglycerides: 305 mg/dL — ABNORMAL HIGH (ref 0.0–149.0)
VLDL: 61 mg/dL — ABNORMAL HIGH (ref 0.0–40.0)

## 2019-10-23 LAB — TSH: TSH: 1.28 u[IU]/mL (ref 0.35–4.50)

## 2019-10-23 LAB — HEMOGLOBIN A1C: Hgb A1c MFr Bld: 5.2 % (ref 4.6–6.5)

## 2019-10-23 LAB — PSA: PSA: 0.85 ng/mL (ref 0.10–4.00)

## 2019-10-23 LAB — LDL CHOLESTEROL, DIRECT: Direct LDL: 96 mg/dL

## 2019-10-23 NOTE — Assessment & Plan Note (Signed)
stable overall by history and exam, recent data reviewed with pt, and pt to continue medical treatment as before,  to f/u any worsening symptoms or concerns  

## 2019-10-23 NOTE — Assessment & Plan Note (Signed)

## 2019-10-23 NOTE — Patient Instructions (Addendum)
You can get signed up for Vaccine by going to :  [email or web address]  Please continue all other medications as before, and refills have been done if requested.  Please have the pharmacy call with any other refills you may need.  Please continue your efforts at being more active, low cholesterol diet, and weight control.  You are otherwise up to date with prevention measures today.  Please keep your appointments with your specialists as you may have planned  Please go to the LAB at the blood drawing area for the tests to be done  You will be contacted by phone if any changes need to be made immediately.  Otherwise, you will receive a letter about your results with an explanation, but please check with MyChart first.  Please remember to sign up for MyChart if you have not done so, as this will be important to you in the future with finding out test results, communicating by private email, and scheduling acute appointments online when needed.  Please make an Appointment to return in 6 months, or sooner if needed

## 2019-10-23 NOTE — Progress Notes (Signed)
Subjective:    Patient ID: Jeffrey Pickett., male    DOB: April 28, 1961, 59 y.o.   MRN: [id number]  HPI  Here for wellness and f/u;  Overall doing ok;  Pt denies Chest pain, worsening SOB, DOE, wheezing, orthopnea, PND, worsening LE edema, palpitations, dizziness or syncope.  Pt denies neurological change such as new headache, facial or extremity weakness.  Pt denies polydipsia, polyuria, or low sugar symptoms. Pt states overall good compliance with treatment and medications, good tolerability, and has been trying to follow appropriate diet.  Pt denies worsening depressive symptoms, suicidal ideation or panic. No fever, night sweats, wt loss, loss of appetite, or other constitutional symptoms.  Pt states good ability with ADL's, has low fall risk, home safety reviewed and adequate, no other significant changes in hearing or vision, and only occasionally active with exercise.  Depression improved after med increased last visit.  No red meat for 6 mo.   Losing wt intentionally. Wt Readings from Last 3 Encounters:  10/23/19 230 lb (104.3 kg)  04/21/19 239 lb (108.4 kg)  10/26/18 248 lb (112.5 kg)    Past Medical History:  Diagnosis Date  . Allergy   . Arthritis   . Closed fracture of lateral malleolus 01/04/2008   Qualifier: History of  By: Sharen Hones  MD, Wynona Canes    . Complication of anesthesia    "problems with my breathing- states was told had sleep apnea, but not ever tested"  . Degenerative disc disease, lumbar   . Depression   . GERD (gastroesophageal reflux disease)   . Heart murmur    has hole in his heart  . Hyperlipidemia   . Hypertension   . Kidney calculus   . Peripheral vascular disease (HCC)    DVT 2004-  left leg  . Presence of IVC filter    right femoral  . Sleep apnea    undiagnosed  . Stroke [facility]) 2004   patient has residual numbness  rt arm   Past Surgical History:  Procedure Laterality Date  . IVC filter placement Right   . JOINT REPLACEMENT    . TOTAL HIP  ARTHROPLASTY Bilateral    left x 2; right x 1  . TOTAL HIP REVISION Right 08/14/2013   Procedure: RIGHT TOTAL HIP REVISION;  Surgeon: [name], MD;  Location: [facility];  Service: Orthopedics;  Laterality: Right;  . Trochanteric bursa injection Left 03/01/13   Preferred Pain Management, Dr. Ardell Isaacs    reports that he has been smoking cigarettes. He has a 39.00 pack-year smoking history. He has never used smokeless tobacco. He reports that he does not drink alcohol or use drugs. family history includes Arthritis in his mother; Heart disease in his mother; Hypertension in his mother. Allergies  Allergen Reactions  . Ace Inhibitors     Possible ACEi induced renal failure.   [name] Hctz [Hydrochlorothiazide]     Thiazide induced hypercalcemia   . Lipitor [Atorvastatin] Nausea And Vomiting  . Penicillins     REACTION: hives  . Statins    Current Outpatient Medications on File Prior to Visit  Medication Sig Dispense Refill  . albuterol (VENTOLIN HFA) 108 (90 Base) MCG/ACT inhaler Inhale 2 puffs into the lungs every 6 (six) hours as needed for wheezing or shortness of breath. 18 g 11  . amLODipine (NORVASC) 10 MG tablet Take 1 tablet (10 mg total) by mouth daily. 90 tablet 1  . b complex vitamins tablet Take 1 tablet by mouth  daily. 100 tablet 3  . carvedilol (COREG) 25 MG tablet TAKE 1 TABLET (25 MG TOTAL) BY MOUTH 2 (TWO) TIMES DAILY WITH A MEAL. 60 tablet 11  . Cholecalciferol (VITAMIN D3) 2000 units capsule Take 1 capsule (2,000 Units total) by mouth daily. 100 capsule 3  . citalopram (CELEXA) 20 MG tablet TAKE 1 TABLET BY MOUTH TWICE A DAY 180 tablet 0  . cloNIDine (CATAPRES) 0.2 MG tablet Take 1 tablet (0.2 mg total) by mouth 2 (two) times daily. 180 tablet 3  . Fluticasone-Salmeterol (ADVAIR DISKUS) 250-50 MCG/DOSE AEPB Inhale 1 puff into the lungs 2 (two) times daily. 1 each 11  . meloxicam (MOBIC) 15 MG tablet Take 1 tablet (15 mg total) by mouth daily. 30 tablet 0  .  oxyCODONE-acetaminophen (PERCOCET) 10-325 MG tablet Take 1 tablet by mouth every 4 (four) hours as needed. for pain  0  . pantoprazole (PROTONIX) 40 MG tablet TAKE 1 TABLET (40 MG TOTAL) BY MOUTH DAILY. 30 tablet 11  . rOPINIRole (REQUIP) 0.25 MG tablet TAKE 1 TABLET BY MOUTH 1 3 HRS BEFORE BEDTIME.    [name] spironolactone (ALDACTONE) 25 MG tablet TAKE 1 TABLET (25 MG TOTAL) BY MOUTH DAILY. NEED TO ESTABLISH WITH NEW PROVIDER. 90 tablet 0  . testosterone cypionate (DEPOTESTOSTERONE CYPIONATE) 200 MG/ML injection Inject 1.5 mLs (300 mg total) into the muscle every 14 (fourteen) days. 10 mL 5   No current facility-administered medications on file prior to visit.   Review of Systems  Constitutional: Negative for other unusual diaphoresis or sweats HENT: Negative for ear discharge or swelling Eyes: Negative for other worsening visual disturbances Respiratory: Negative for stridor or other swelling  Gastrointestinal: Negative for worsening distension or other blood Genitourinary: Negative for retention or other urinary change Musculoskeletal: Negative for other MSK pain or swelling Skin: Negative for color change or other new lesions Neurological: Negative for worsening tremors and other numbness  Psychiatric/Behavioral: Negative for worsening agitation or other fatigue All otherwise neg per pt     Objective:   Physical Exam BP 122/84   Pulse 72   Ht 5\' 11"  (1.803 m)   Wt 230 lb (104.3 kg)   SpO2 93%   BMI 32.08 kg/m  VS noted,  Constitutional: Pt appears in NAD HENT: Head: NCAT.  Right Ear: External ear normal.  Left Ear: External ear normal.  Eyes: . Pupils are equal, round, and reactive to light. Conjunctivae and EOM are normal Nose: without d/c or deformity Neck: Neck supple. Gross normal ROM Cardiovascular: Normal rate and regular rhythm.   Pulmonary/Chest: Effort normal and breath sounds without rales or wheezing.  Abd:  Soft, NT, ND, + BS, no organomegaly Neurological: Pt is  alert. At baseline orientation, motor grossly intact Skin: Skin is warm. No rashes, other new lesions, no LE edema Psychiatric: Pt behavior is normal without agitation  All otherwise neg per pt Lab Results  Component Value Date   WBC 5.5 10/23/2019   HGB 14.5 10/23/2019   HCT 41.6 10/23/2019   PLT 175.0 10/23/2019   GLUCOSE 92 10/23/2019   CHOL 180 10/23/2019   TRIG 305.0 (H) 10/23/2019   HDL 64.50 10/23/2019   LDLDIRECT 96.0 10/23/2019   LDLCALC 120 (H) 02/09/2018   ALT 40 10/23/2019   AST 61 (H) 10/23/2019   NA 133 (L) 10/23/2019   K 4.4 10/23/2019   CL 98 10/23/2019   CREATININE 1.02 10/23/2019   BUN 17 10/23/2019   CO2 29 10/23/2019   TSH 1.28 10/23/2019  PSA 0.85 10/23/2019   INR 0.85 08/07/2013   HGBA1C 5.2 10/23/2019       Assessment & Plan:

## 2019-11-04 ENCOUNTER — Other Ambulatory Visit: Payer: Self-pay | Admitting: Internal Medicine

## 2019-11-04 DIAGNOSIS — F339 Major depressive disorder, recurrent, unspecified: Secondary | ICD-10-CM

## 2019-11-04 NOTE — Telephone Encounter (Signed)
Done erx 

## 2019-11-15 DIAGNOSIS — G894 Chronic pain syndrome: Secondary | ICD-10-CM | POA: Diagnosis not present

## 2019-11-15 DIAGNOSIS — G2581 Restless legs syndrome: Secondary | ICD-10-CM | POA: Diagnosis not present

## 2019-11-15 DIAGNOSIS — Z79891 Long term (current) use of opiate analgesic: Secondary | ICD-10-CM | POA: Diagnosis not present

## 2019-11-15 DIAGNOSIS — M25572 Pain in left ankle and joints of left foot: Secondary | ICD-10-CM | POA: Diagnosis not present

## 2019-11-15 DIAGNOSIS — M25552 Pain in left hip: Secondary | ICD-10-CM | POA: Diagnosis not present

## 2019-11-15 DIAGNOSIS — M545 Low back pain: Secondary | ICD-10-CM | POA: Diagnosis not present

## 2019-12-13 DIAGNOSIS — G2581 Restless legs syndrome: Secondary | ICD-10-CM | POA: Diagnosis not present

## 2019-12-13 DIAGNOSIS — M545 Low back pain: Secondary | ICD-10-CM | POA: Diagnosis not present

## 2019-12-13 DIAGNOSIS — M25552 Pain in left hip: Secondary | ICD-10-CM | POA: Diagnosis not present

## 2019-12-13 DIAGNOSIS — M25572 Pain in left ankle and joints of left foot: Secondary | ICD-10-CM | POA: Diagnosis not present

## 2019-12-13 DIAGNOSIS — Z79891 Long term (current) use of opiate analgesic: Secondary | ICD-10-CM | POA: Diagnosis not present

## 2019-12-13 DIAGNOSIS — G894 Chronic pain syndrome: Secondary | ICD-10-CM | POA: Diagnosis not present

## 2020-01-15 ENCOUNTER — Other Ambulatory Visit: Payer: Self-pay | Admitting: Internal Medicine

## 2020-01-15 DIAGNOSIS — M25572 Pain in left ankle and joints of left foot: Secondary | ICD-10-CM | POA: Diagnosis not present

## 2020-01-15 DIAGNOSIS — I10 Essential (primary) hypertension: Secondary | ICD-10-CM

## 2020-01-15 DIAGNOSIS — G2581 Restless legs syndrome: Secondary | ICD-10-CM | POA: Diagnosis not present

## 2020-01-15 DIAGNOSIS — G894 Chronic pain syndrome: Secondary | ICD-10-CM | POA: Diagnosis not present

## 2020-01-15 DIAGNOSIS — M545 Low back pain: Secondary | ICD-10-CM | POA: Diagnosis not present

## 2020-01-15 DIAGNOSIS — Z79891 Long term (current) use of opiate analgesic: Secondary | ICD-10-CM | POA: Diagnosis not present

## 2020-01-15 DIAGNOSIS — M25552 Pain in left hip: Secondary | ICD-10-CM | POA: Diagnosis not present

## 2020-01-15 NOTE — Telephone Encounter (Signed)
Please refill as per office routine med refill policy (all routine meds refilled for 3 mo or monthly per pt preference up to one year from last visit, then month to month grace period for 3 mo, then further med refills will have to be denied)  

## 2020-01-24 DIAGNOSIS — M542 Cervicalgia: Secondary | ICD-10-CM | POA: Diagnosis not present

## 2020-02-12 ENCOUNTER — Other Ambulatory Visit: Payer: Self-pay | Admitting: Internal Medicine

## 2020-02-12 DIAGNOSIS — G894 Chronic pain syndrome: Secondary | ICD-10-CM | POA: Diagnosis not present

## 2020-02-12 DIAGNOSIS — G2581 Restless legs syndrome: Secondary | ICD-10-CM | POA: Diagnosis not present

## 2020-02-12 DIAGNOSIS — Z79891 Long term (current) use of opiate analgesic: Secondary | ICD-10-CM | POA: Diagnosis not present

## 2020-02-12 DIAGNOSIS — M545 Low back pain: Secondary | ICD-10-CM | POA: Diagnosis not present

## 2020-02-12 DIAGNOSIS — I10 Essential (primary) hypertension: Secondary | ICD-10-CM

## 2020-02-12 DIAGNOSIS — M25572 Pain in left ankle and joints of left foot: Secondary | ICD-10-CM | POA: Diagnosis not present

## 2020-02-12 DIAGNOSIS — M25552 Pain in left hip: Secondary | ICD-10-CM | POA: Diagnosis not present

## 2020-02-12 NOTE — Telephone Encounter (Signed)
Please refill as per office routine med refill policy (all routine meds refilled for 3 mo or monthly per pt preference up to one year from last visit, then month to month grace period for 3 mo, then further med refills will have to be denied)  

## 2020-02-13 ENCOUNTER — Telehealth: Payer: Self-pay | Admitting: Internal Medicine

## 2020-02-13 DIAGNOSIS — G8929 Other chronic pain: Secondary | ICD-10-CM

## 2020-02-13 NOTE — Telephone Encounter (Signed)
I am so confused as to why he would want a referral to a facility that does not provide pain management anymore....[name] KitchenMarland Kitchen

## 2020-02-13 NOTE — Telephone Encounter (Signed)
New message:   Pt is calling and states he is needing a referral to [facility] on [address] for pain mng. He states that yesterday was his last appt with them due to them not providing these services anymore. He also states they did prescribe him some medication to last for the next 30 days. Please advise.

## 2020-02-14 NOTE — Telephone Encounter (Signed)
Pt states he needs the referral to [facility]. He does not currently go there he has been going to another facility but was told yesterday that they can no longer render those types of services. I don't remember the patient telling me where he currently goes.

## 2020-02-14 NOTE — Telephone Encounter (Signed)
Ok I hope this is correct since the original note said the [facility] was not doing pain management

## 2020-02-28 ENCOUNTER — Other Ambulatory Visit: Payer: Self-pay | Admitting: Internal Medicine

## 2020-02-28 DIAGNOSIS — M545 Low back pain: Secondary | ICD-10-CM | POA: Diagnosis not present

## 2020-02-28 DIAGNOSIS — F339 Major depressive disorder, recurrent, unspecified: Secondary | ICD-10-CM

## 2020-02-28 DIAGNOSIS — G894 Chronic pain syndrome: Secondary | ICD-10-CM | POA: Diagnosis not present

## 2020-03-28 DIAGNOSIS — R69 Illness, unspecified: Secondary | ICD-10-CM | POA: Diagnosis not present

## 2020-03-28 DIAGNOSIS — M545 Low back pain: Secondary | ICD-10-CM | POA: Diagnosis not present

## 2020-03-28 DIAGNOSIS — G894 Chronic pain syndrome: Secondary | ICD-10-CM | POA: Diagnosis not present

## 2020-03-28 DIAGNOSIS — G8929 Other chronic pain: Secondary | ICD-10-CM | POA: Diagnosis not present

## 2020-03-28 DIAGNOSIS — Z79899 Other long term (current) drug therapy: Secondary | ICD-10-CM | POA: Diagnosis not present

## 2020-04-13 ENCOUNTER — Other Ambulatory Visit: Payer: Self-pay | Admitting: Internal Medicine

## 2020-04-13 NOTE — Telephone Encounter (Signed)
Please refill as per office routine med refill policy (all routine meds refilled for 3 mo or monthly per pt preference up to one year from last visit, then month to month grace period for 3 mo, then further med refills will have to be denied)  

## 2020-04-18 DIAGNOSIS — G8929 Other chronic pain: Secondary | ICD-10-CM | POA: Diagnosis not present

## 2020-04-18 DIAGNOSIS — M545 Low back pain: Secondary | ICD-10-CM | POA: Diagnosis not present

## 2020-04-18 DIAGNOSIS — G894 Chronic pain syndrome: Secondary | ICD-10-CM | POA: Diagnosis not present

## 2020-04-18 DIAGNOSIS — R69 Illness, unspecified: Secondary | ICD-10-CM | POA: Diagnosis not present

## 2020-04-18 DIAGNOSIS — Z79899 Other long term (current) drug therapy: Secondary | ICD-10-CM | POA: Diagnosis not present

## 2020-05-31 DIAGNOSIS — R69 Illness, unspecified: Secondary | ICD-10-CM | POA: Diagnosis not present

## 2020-05-31 DIAGNOSIS — G894 Chronic pain syndrome: Secondary | ICD-10-CM | POA: Diagnosis not present

## 2020-05-31 DIAGNOSIS — M545 Low back pain: Secondary | ICD-10-CM | POA: Diagnosis not present

## 2020-05-31 DIAGNOSIS — G8929 Other chronic pain: Secondary | ICD-10-CM | POA: Diagnosis not present

## 2020-05-31 DIAGNOSIS — Z79899 Other long term (current) drug therapy: Secondary | ICD-10-CM | POA: Diagnosis not present

## 2020-06-15 ENCOUNTER — Other Ambulatory Visit: Payer: Self-pay | Admitting: Internal Medicine

## 2020-06-15 NOTE — Telephone Encounter (Signed)
Please refill as per office routine med refill policy (all routine meds refilled for 3 mo or monthly per pt preference up to one year from last visit, then month to month grace period for 3 mo, then further med refills will have to be denied)  

## 2020-07-01 DIAGNOSIS — G894 Chronic pain syndrome: Secondary | ICD-10-CM | POA: Diagnosis not present

## 2020-07-01 DIAGNOSIS — M545 Low back pain: Secondary | ICD-10-CM | POA: Diagnosis not present

## 2020-07-01 DIAGNOSIS — Z79899 Other long term (current) drug therapy: Secondary | ICD-10-CM | POA: Diagnosis not present

## 2020-07-01 DIAGNOSIS — R69 Illness, unspecified: Secondary | ICD-10-CM | POA: Diagnosis not present

## 2020-07-01 DIAGNOSIS — G8929 Other chronic pain: Secondary | ICD-10-CM | POA: Diagnosis not present

## 2020-07-31 DIAGNOSIS — G8929 Other chronic pain: Secondary | ICD-10-CM | POA: Diagnosis not present

## 2020-07-31 DIAGNOSIS — R69 Illness, unspecified: Secondary | ICD-10-CM | POA: Diagnosis not present

## 2020-07-31 DIAGNOSIS — M545 Low back pain, unspecified: Secondary | ICD-10-CM | POA: Diagnosis not present

## 2020-07-31 DIAGNOSIS — Z79899 Other long term (current) drug therapy: Secondary | ICD-10-CM | POA: Diagnosis not present

## 2020-07-31 DIAGNOSIS — G894 Chronic pain syndrome: Secondary | ICD-10-CM | POA: Diagnosis not present

## 2020-08-20 ENCOUNTER — Other Ambulatory Visit: Payer: Self-pay | Admitting: Internal Medicine

## 2020-08-20 NOTE — Telephone Encounter (Signed)
Please refill as per office routine med refill policy (all routine meds refilled for 3 mo or monthly per pt preference up to one year from last visit, then month to month grace period for 3 mo, then further med refills will have to be denied)  

## 2020-08-31 DIAGNOSIS — G894 Chronic pain syndrome: Secondary | ICD-10-CM | POA: Diagnosis not present

## 2020-08-31 DIAGNOSIS — G8929 Other chronic pain: Secondary | ICD-10-CM | POA: Diagnosis not present

## 2020-08-31 DIAGNOSIS — Z79899 Other long term (current) drug therapy: Secondary | ICD-10-CM | POA: Diagnosis not present

## 2020-08-31 DIAGNOSIS — R69 Illness, unspecified: Secondary | ICD-10-CM | POA: Diagnosis not present

## 2020-08-31 DIAGNOSIS — M545 Low back pain, unspecified: Secondary | ICD-10-CM | POA: Diagnosis not present

## 2020-10-06 ENCOUNTER — Other Ambulatory Visit: Payer: Self-pay | Admitting: Internal Medicine

## 2020-10-06 DIAGNOSIS — K219 Gastro-esophageal reflux disease without esophagitis: Secondary | ICD-10-CM

## 2020-10-06 NOTE — Telephone Encounter (Signed)
Please refill as per office routine med refill policy (all routine meds refilled for 3 mo or monthly per pt preference up to one year from last visit, then month to month grace period for 3 mo, then further med refills will have to be denied)  

## 2020-10-22 ENCOUNTER — Ambulatory Visit: Payer: Medicare HMO | Admitting: Internal Medicine

## 2020-10-22 DIAGNOSIS — G894 Chronic pain syndrome: Secondary | ICD-10-CM | POA: Diagnosis not present

## 2020-10-22 DIAGNOSIS — G8929 Other chronic pain: Secondary | ICD-10-CM | POA: Diagnosis not present

## 2020-10-22 DIAGNOSIS — M545 Low back pain, unspecified: Secondary | ICD-10-CM | POA: Diagnosis not present

## 2020-10-22 DIAGNOSIS — Z79899 Other long term (current) drug therapy: Secondary | ICD-10-CM | POA: Diagnosis not present

## 2020-11-06 ENCOUNTER — Other Ambulatory Visit: Payer: Self-pay | Admitting: Internal Medicine

## 2020-11-06 DIAGNOSIS — I10 Essential (primary) hypertension: Secondary | ICD-10-CM

## 2020-11-07 NOTE — Telephone Encounter (Signed)
Please refill as per office routine med refill policy (all routine meds refilled for 3 mo or monthly per pt preference up to one year from last visit, then month to month grace period for 3 mo, then further med refills will have to be denied)  

## 2020-11-21 ENCOUNTER — Other Ambulatory Visit: Payer: Self-pay | Admitting: Internal Medicine

## 2020-11-21 DIAGNOSIS — M545 Low back pain, unspecified: Secondary | ICD-10-CM | POA: Diagnosis not present

## 2020-11-21 DIAGNOSIS — F32A Depression, unspecified: Secondary | ICD-10-CM | POA: Diagnosis not present

## 2020-11-21 DIAGNOSIS — Z79899 Other long term (current) drug therapy: Secondary | ICD-10-CM | POA: Diagnosis not present

## 2020-11-21 DIAGNOSIS — I10 Essential (primary) hypertension: Secondary | ICD-10-CM

## 2020-11-21 DIAGNOSIS — G894 Chronic pain syndrome: Secondary | ICD-10-CM | POA: Diagnosis not present

## 2020-11-21 DIAGNOSIS — G8929 Other chronic pain: Secondary | ICD-10-CM | POA: Diagnosis not present

## 2020-11-21 NOTE — Telephone Encounter (Signed)
Please refill as per office routine med refill policy (all routine meds refilled for 3 mo or monthly per pt preference up to one year from last visit, then month to month grace period for 3 mo, then further med refills will have to be denied)  

## 2020-12-14 ENCOUNTER — Other Ambulatory Visit: Payer: Self-pay | Admitting: Internal Medicine

## 2020-12-14 DIAGNOSIS — I10 Essential (primary) hypertension: Secondary | ICD-10-CM

## 2020-12-15 NOTE — Telephone Encounter (Signed)
Please refill as per office routine med refill policy (all routine meds refilled for 3 mo or monthly per pt preference up to one year from last visit, then month to month grace period for 3 mo, then further med refills will have to be denied)  

## 2020-12-19 DIAGNOSIS — M545 Low back pain, unspecified: Secondary | ICD-10-CM | POA: Diagnosis not present

## 2020-12-19 DIAGNOSIS — Z79899 Other long term (current) drug therapy: Secondary | ICD-10-CM | POA: Diagnosis not present

## 2020-12-19 DIAGNOSIS — G8929 Other chronic pain: Secondary | ICD-10-CM | POA: Diagnosis not present

## 2020-12-19 DIAGNOSIS — G894 Chronic pain syndrome: Secondary | ICD-10-CM | POA: Diagnosis not present

## 2020-12-30 ENCOUNTER — Other Ambulatory Visit: Payer: Self-pay | Admitting: Internal Medicine

## 2020-12-30 DIAGNOSIS — I10 Essential (primary) hypertension: Secondary | ICD-10-CM

## 2020-12-30 NOTE — Telephone Encounter (Signed)
Please refill as per office routine med refill policy (all routine meds refilled for 3 mo or monthly per pt preference up to one year from last visit, then month to month grace period for 3 mo, then further med refills will have to be denied)  

## 2021-01-20 DIAGNOSIS — Z79899 Other long term (current) drug therapy: Secondary | ICD-10-CM | POA: Diagnosis not present

## 2021-01-20 DIAGNOSIS — G8929 Other chronic pain: Secondary | ICD-10-CM | POA: Diagnosis not present

## 2021-01-20 DIAGNOSIS — G894 Chronic pain syndrome: Secondary | ICD-10-CM | POA: Diagnosis not present

## 2021-01-20 DIAGNOSIS — I10 Essential (primary) hypertension: Secondary | ICD-10-CM | POA: Diagnosis not present

## 2021-01-20 DIAGNOSIS — M545 Low back pain, unspecified: Secondary | ICD-10-CM | POA: Diagnosis not present

## 2021-02-15 ENCOUNTER — Other Ambulatory Visit: Payer: Self-pay | Admitting: Internal Medicine

## 2021-02-15 DIAGNOSIS — I10 Essential (primary) hypertension: Secondary | ICD-10-CM

## 2021-02-16 NOTE — Telephone Encounter (Signed)
Please refill as per office routine med refill policy (all routine meds refilled for 3 mo or monthly per pt preference up to one year from last visit, then month to month grace period for 3 mo, then further med refills will have to be denied)  

## 2021-02-19 DIAGNOSIS — G894 Chronic pain syndrome: Secondary | ICD-10-CM | POA: Diagnosis not present

## 2021-02-19 DIAGNOSIS — G8929 Other chronic pain: Secondary | ICD-10-CM | POA: Diagnosis not present

## 2021-02-19 DIAGNOSIS — I10 Essential (primary) hypertension: Secondary | ICD-10-CM | POA: Diagnosis not present

## 2021-02-19 DIAGNOSIS — M545 Low back pain, unspecified: Secondary | ICD-10-CM | POA: Diagnosis not present

## 2021-02-19 DIAGNOSIS — Z79899 Other long term (current) drug therapy: Secondary | ICD-10-CM | POA: Diagnosis not present

## 2021-03-20 DIAGNOSIS — G8929 Other chronic pain: Secondary | ICD-10-CM | POA: Diagnosis not present

## 2021-03-20 DIAGNOSIS — M545 Low back pain, unspecified: Secondary | ICD-10-CM | POA: Diagnosis not present

## 2021-03-20 DIAGNOSIS — G894 Chronic pain syndrome: Secondary | ICD-10-CM | POA: Diagnosis not present

## 2021-03-20 DIAGNOSIS — Z79899 Other long term (current) drug therapy: Secondary | ICD-10-CM | POA: Diagnosis not present

## 2021-04-17 DIAGNOSIS — M545 Low back pain, unspecified: Secondary | ICD-10-CM | POA: Diagnosis not present

## 2021-04-17 DIAGNOSIS — Z79899 Other long term (current) drug therapy: Secondary | ICD-10-CM | POA: Diagnosis not present

## 2021-04-17 DIAGNOSIS — G8929 Other chronic pain: Secondary | ICD-10-CM | POA: Diagnosis not present

## 2021-04-17 DIAGNOSIS — I10 Essential (primary) hypertension: Secondary | ICD-10-CM | POA: Diagnosis not present

## 2021-04-17 DIAGNOSIS — G894 Chronic pain syndrome: Secondary | ICD-10-CM | POA: Diagnosis not present

## 2021-05-18 DIAGNOSIS — G8929 Other chronic pain: Secondary | ICD-10-CM | POA: Diagnosis not present

## 2021-05-18 DIAGNOSIS — I10 Essential (primary) hypertension: Secondary | ICD-10-CM | POA: Diagnosis not present

## 2021-05-18 DIAGNOSIS — M545 Low back pain, unspecified: Secondary | ICD-10-CM | POA: Diagnosis not present

## 2021-05-18 DIAGNOSIS — R030 Elevated blood-pressure reading, without diagnosis of hypertension: Secondary | ICD-10-CM | POA: Diagnosis not present

## 2021-05-18 DIAGNOSIS — Z79899 Other long term (current) drug therapy: Secondary | ICD-10-CM | POA: Diagnosis not present

## 2021-05-18 DIAGNOSIS — G894 Chronic pain syndrome: Secondary | ICD-10-CM | POA: Diagnosis not present

## 2021-06-18 DIAGNOSIS — I10 Essential (primary) hypertension: Secondary | ICD-10-CM | POA: Diagnosis not present

## 2021-06-18 DIAGNOSIS — G8929 Other chronic pain: Secondary | ICD-10-CM | POA: Diagnosis not present

## 2021-06-18 DIAGNOSIS — R030 Elevated blood-pressure reading, without diagnosis of hypertension: Secondary | ICD-10-CM | POA: Diagnosis not present

## 2021-06-18 DIAGNOSIS — Z79899 Other long term (current) drug therapy: Secondary | ICD-10-CM | POA: Diagnosis not present

## 2021-06-18 DIAGNOSIS — M545 Low back pain, unspecified: Secondary | ICD-10-CM | POA: Diagnosis not present

## 2021-07-18 DIAGNOSIS — Z23 Encounter for immunization: Secondary | ICD-10-CM | POA: Diagnosis not present

## 2021-07-18 DIAGNOSIS — R030 Elevated blood-pressure reading, without diagnosis of hypertension: Secondary | ICD-10-CM | POA: Diagnosis not present

## 2021-07-18 DIAGNOSIS — M545 Low back pain, unspecified: Secondary | ICD-10-CM | POA: Diagnosis not present

## 2021-07-18 DIAGNOSIS — Z79899 Other long term (current) drug therapy: Secondary | ICD-10-CM | POA: Diagnosis not present

## 2021-07-18 DIAGNOSIS — I10 Essential (primary) hypertension: Secondary | ICD-10-CM | POA: Diagnosis not present

## 2021-07-18 DIAGNOSIS — G8929 Other chronic pain: Secondary | ICD-10-CM | POA: Diagnosis not present

## 2021-08-18 DIAGNOSIS — G8929 Other chronic pain: Secondary | ICD-10-CM | POA: Diagnosis not present

## 2021-08-18 DIAGNOSIS — E559 Vitamin D deficiency, unspecified: Secondary | ICD-10-CM | POA: Diagnosis not present

## 2021-08-18 DIAGNOSIS — Z1159 Encounter for screening for other viral diseases: Secondary | ICD-10-CM | POA: Diagnosis not present

## 2021-08-18 DIAGNOSIS — Z79899 Other long term (current) drug therapy: Secondary | ICD-10-CM | POA: Diagnosis not present

## 2021-08-18 DIAGNOSIS — E7800 Pure hypercholesterolemia, unspecified: Secondary | ICD-10-CM | POA: Diagnosis not present

## 2021-08-18 DIAGNOSIS — M545 Low back pain, unspecified: Secondary | ICD-10-CM | POA: Diagnosis not present

## 2021-08-18 DIAGNOSIS — I10 Essential (primary) hypertension: Secondary | ICD-10-CM | POA: Diagnosis not present

## 2021-08-18 DIAGNOSIS — Z0000 Encounter for general adult medical examination without abnormal findings: Secondary | ICD-10-CM | POA: Diagnosis not present

## 2021-08-18 DIAGNOSIS — R030 Elevated blood-pressure reading, without diagnosis of hypertension: Secondary | ICD-10-CM | POA: Diagnosis not present

## 2021-08-20 DIAGNOSIS — Z79899 Other long term (current) drug therapy: Secondary | ICD-10-CM | POA: Diagnosis not present

## 2021-09-17 DIAGNOSIS — R030 Elevated blood-pressure reading, without diagnosis of hypertension: Secondary | ICD-10-CM | POA: Diagnosis not present

## 2021-09-17 DIAGNOSIS — I10 Essential (primary) hypertension: Secondary | ICD-10-CM | POA: Diagnosis not present

## 2021-09-17 DIAGNOSIS — G8929 Other chronic pain: Secondary | ICD-10-CM | POA: Diagnosis not present

## 2021-09-17 DIAGNOSIS — M545 Low back pain, unspecified: Secondary | ICD-10-CM | POA: Diagnosis not present

## 2021-09-17 DIAGNOSIS — Z79899 Other long term (current) drug therapy: Secondary | ICD-10-CM | POA: Diagnosis not present

## 2021-10-18 DIAGNOSIS — G8929 Other chronic pain: Secondary | ICD-10-CM | POA: Diagnosis not present

## 2021-10-18 DIAGNOSIS — R030 Elevated blood-pressure reading, without diagnosis of hypertension: Secondary | ICD-10-CM | POA: Diagnosis not present

## 2021-10-18 DIAGNOSIS — Z79899 Other long term (current) drug therapy: Secondary | ICD-10-CM | POA: Diagnosis not present

## 2021-10-18 DIAGNOSIS — M545 Low back pain, unspecified: Secondary | ICD-10-CM | POA: Diagnosis not present

## 2021-10-18 DIAGNOSIS — Z0000 Encounter for general adult medical examination without abnormal findings: Secondary | ICD-10-CM | POA: Diagnosis not present

## 2021-10-18 DIAGNOSIS — I10 Essential (primary) hypertension: Secondary | ICD-10-CM | POA: Diagnosis not present

## 2021-11-13 ENCOUNTER — Telehealth: Payer: Self-pay | Admitting: Internal Medicine

## 2021-11-13 NOTE — Telephone Encounter (Signed)
LVM for pt to RTN my call to schedule AWV with [facility]. Please schedule if pt calls the office.
